# Patient Record
Sex: Female | Born: 1990
Health system: Southern US, Community
[De-identification: ages and names within clinical notes are randomized; demographics above are authoritative.]

## PROBLEM LIST (undated history)

## (undated) DIAGNOSIS — M542 Cervicalgia: Secondary | ICD-10-CM

## (undated) DIAGNOSIS — F329 Major depressive disorder, single episode, unspecified: Secondary | ICD-10-CM

## (undated) DIAGNOSIS — E669 Obesity, unspecified: Secondary | ICD-10-CM

## (undated) DIAGNOSIS — H8110 Benign paroxysmal vertigo, unspecified ear: Secondary | ICD-10-CM

## (undated) DIAGNOSIS — R41840 Attention and concentration deficit: Secondary | ICD-10-CM

## (undated) DIAGNOSIS — M21619 Bunion of unspecified foot: Secondary | ICD-10-CM

## (undated) DIAGNOSIS — D690 Allergic purpura: Secondary | ICD-10-CM

## (undated) DIAGNOSIS — J309 Allergic rhinitis, unspecified: Secondary | ICD-10-CM

## (undated) DIAGNOSIS — L989 Disorder of the skin and subcutaneous tissue, unspecified: Secondary | ICD-10-CM

## (undated) DIAGNOSIS — N83201 Unspecified ovarian cyst, right side: Secondary | ICD-10-CM

## (undated) DIAGNOSIS — F32A Depression, unspecified: Secondary | ICD-10-CM

## (undated) DIAGNOSIS — A6000 Herpesviral infection of urogenital system, unspecified: Secondary | ICD-10-CM

## (undated) DIAGNOSIS — Z113 Encounter for screening for infections with a predominantly sexual mode of transmission: Secondary | ICD-10-CM

## (undated) DIAGNOSIS — Z309 Encounter for contraceptive management, unspecified: Secondary | ICD-10-CM

## (undated) DIAGNOSIS — R591 Generalized enlarged lymph nodes: Secondary | ICD-10-CM

## (undated) DIAGNOSIS — F3289 Other specified depressive episodes: Secondary | ICD-10-CM

## (undated) DIAGNOSIS — J45909 Unspecified asthma, uncomplicated: Secondary | ICD-10-CM

## (undated) DIAGNOSIS — F3281 Premenstrual dysphoric disorder: Secondary | ICD-10-CM

## (undated) HISTORY — DX: Herpesviral infection of urogenital system, unspecified: A60.00

## (undated) HISTORY — DX: Major depressive disorder, single episode, unspecified: F32.9

## (undated) HISTORY — DX: Cervicalgia: M54.2

## (undated) HISTORY — DX: Depression, unspecified: F32.A

## (undated) HISTORY — DX: Disorder of the skin and subcutaneous tissue, unspecified: L98.9

## (undated) HISTORY — DX: Other specified depressive episodes: F32.89

## (undated) HISTORY — DX: Benign paroxysmal vertigo, unspecified ear: H81.10

## (undated) HISTORY — DX: Attention and concentration deficit: R41.840

## (undated) HISTORY — DX: Bunion of unspecified foot: M21.619

## (undated) HISTORY — DX: Allergic purpura: D69.0

## (undated) HISTORY — PX: BUNIONECTOMY: SHX129

## (undated) HISTORY — DX: Encounter for screening for infections with a predominantly sexual mode of transmission: Z11.3

## (undated) HISTORY — DX: Generalized enlarged lymph nodes: R59.1

## (undated) HISTORY — DX: Premenstrual dysphoric disorder: F32.81

## (undated) HISTORY — DX: Unspecified ovarian cyst, right side: N83.201

## (undated) HISTORY — DX: Encounter for contraceptive management, unspecified: Z30.9

## (undated) HISTORY — DX: Obesity, unspecified: E66.9

## (undated) HISTORY — DX: Allergic rhinitis, unspecified: J30.9

---
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
## 2012-01-08 HISTORY — PX: WISDOM TOOTH EXTRACTION: SHX21

## 2012-08-07 HISTORY — PX: OVARIAN CYST REMOVAL: SHX89

## 2012-08-29 ENCOUNTER — Emergency Department: Payer: Self-pay | Admitting: Emergency Medicine

## 2012-08-29 LAB — COMPREHENSIVE METABOLIC PANEL
Anion Gap: 3 — ABNORMAL LOW (ref 7–16)
BUN: 9 mg/dL (ref 7–18)
Bilirubin,Total: 1.1 mg/dL — ABNORMAL HIGH (ref 0.2–1.0)
Calcium, Total: 8.9 mg/dL (ref 8.5–10.1)
Chloride: 106 mmol/L (ref 98–107)
Co2: 28 mmol/L (ref 21–32)
EGFR (Non-African Amer.): >60
Glucose: 84 mg/dL (ref 65–99)
SGOT(AST): 21 U/L (ref 15–37)
SGPT (ALT): 24 U/L (ref 12–78)
Sodium: 137 mmol/L (ref 136–145)

## 2012-08-29 LAB — URINALYSIS, COMPLETE
Bilirubin,UR: NEGATIVE
Blood: NEGATIVE
Glucose,UR: NEGATIVE mg/dL (ref 0–75)
Ketone: NEGATIVE
Nitrite: NEGATIVE
Ph: 8 (ref 4.5–8.0)
Protein: NEGATIVE
RBC,UR: 7 /HPF (ref 0–5)
Specific Gravity: 1.01 (ref 1.003–1.030)
WBC UR: NONE SEEN /HPF (ref 0–5)

## 2012-08-29 LAB — CBC
MCH: 28.8 pg (ref 26.0–34.0)
MCHC: 34.4 g/dL (ref 32.0–36.0)
WBC: 7.9 10*3/uL (ref 3.6–11.0)

## 2012-08-29 LAB — WET PREP, GENITAL

## 2012-08-29 LAB — LIPASE, BLOOD: Lipase: 80 U/L (ref 73–393)

## 2012-09-05 ENCOUNTER — Emergency Department: Payer: Self-pay | Admitting: Emergency Medicine

## 2012-09-05 LAB — CBC WITH DIFFERENTIAL/PLATELET
Basophil #: 0 10*3/uL (ref 0.0–0.1)
Basophil %: 0.6 %
Eosinophil #: 0.2 10*3/uL (ref 0.0–0.7)
Eosinophil %: 3 %
HCT: 40.2 % (ref 35.0–47.0)
Lymphocyte #: 2.5 10*3/uL (ref 1.0–3.6)
MCHC: 34.5 g/dL (ref 32.0–36.0)
MCV: 84 fL (ref 80–100)
Neutrophil %: 50.4 %
Platelet: 214 10*3/uL (ref 150–440)
RBC: 4.79 10*6/uL (ref 3.80–5.20)
RDW: 14 % (ref 11.5–14.5)

## 2012-09-05 LAB — URINALYSIS, COMPLETE
Bacteria: NONE SEEN
Bilirubin,UR: NEGATIVE
Blood: NEGATIVE
Ketone: NEGATIVE
Leukocyte Esterase: NEGATIVE
Nitrite: NEGATIVE
Ph: 6 (ref 4.5–8.0)
Protein: NEGATIVE
Specific Gravity: 1.023 (ref 1.003–1.030)
Squamous Epithelial: 1

## 2012-09-05 LAB — BASIC METABOLIC PANEL
Calcium, Total: 8.7 mg/dL (ref 8.5–10.1)
Chloride: 105 mmol/L (ref 98–107)
Co2: 26 mmol/L (ref 21–32)
Glucose: 87 mg/dL (ref 65–99)
Sodium: 137 mmol/L (ref 136–145)

## 2012-09-09 ENCOUNTER — Ambulatory Visit: Payer: Self-pay | Admitting: Obstetrics & Gynecology

## 2012-09-09 LAB — CBC
HCT: 41.2 % (ref 35.0–47.0)
HGB: 14.1 g/dL (ref 12.0–16.0)
MCH: 28.6 pg (ref 26.0–34.0)
MCV: 84 fL (ref 80–100)
Platelet: 247 10*3/uL (ref 150–440)
RBC: 4.94 10*6/uL (ref 3.80–5.20)
RDW: 13.5 % (ref 11.5–14.5)
WBC: 7.1 10*3/uL (ref 3.6–11.0)

## 2012-09-09 LAB — PREGNANCY, URINE: Pregnancy Test, Urine: NEGATIVE m[IU]/mL

## 2012-09-10 ENCOUNTER — Ambulatory Visit: Payer: Self-pay | Admitting: Obstetrics & Gynecology

## 2012-11-02 ENCOUNTER — Emergency Department: Payer: Self-pay | Admitting: Emergency Medicine

## 2013-08-28 ENCOUNTER — Observation Stay: Payer: Self-pay | Admitting: Obstetrics & Gynecology

## 2013-10-04 ENCOUNTER — Observation Stay: Payer: Self-pay | Admitting: Obstetrics and Gynecology

## 2013-10-04 LAB — PIH PROFILE
ANION GAP: 12 (ref 7–16)
AST: 12 U/L — AB (ref 15–37)
BUN: 3 mg/dL — ABNORMAL LOW (ref 7–18)
CO2: 22 mmol/L (ref 21–32)
Calcium, Total: 8.6 mg/dL (ref 8.5–10.1)
Chloride: 106 mmol/L (ref 98–107)
Creatinine: 0.51 mg/dL — ABNORMAL LOW (ref 0.60–1.30)
EGFR (Non-African Amer.): >60
GLUCOSE: 73 mg/dL (ref 65–99)
HCT: 38.1 % (ref 35.0–47.0)
HGB: 12.3 g/dL (ref 12.0–16.0)
MCH: 27.9 pg (ref 26.0–34.0)
MCHC: 32.4 g/dL (ref 32.0–36.0)
MCV: 86 fL (ref 80–100)
Osmolality: 275 (ref 275–301)
Platelet: 225 10*3/uL (ref 150–440)
Potassium: 3.5 mmol/L (ref 3.5–5.1)
RBC: 4.43 10*6/uL (ref 3.80–5.20)
RDW: 13.4 % (ref 11.5–14.5)
SODIUM: 140 mmol/L (ref 136–145)
Uric Acid: 3.6 mg/dL (ref 2.6–6.0)
WBC: 11.7 10*3/uL — AB (ref 3.6–11.0)

## 2013-10-04 LAB — PROTEIN / CREATININE RATIO, URINE
CREATININE, URINE: 32.6 mg/dL (ref 30.0–125.0)
PROTEIN, RANDOM URINE: 10 mg/dL (ref 0–12)
PROTEIN/CREAT. RATIO: 307 mg/g{creat} — AB (ref 0–200)

## 2013-10-05 ENCOUNTER — Inpatient Hospital Stay: Payer: Self-pay | Admitting: Obstetrics and Gynecology

## 2013-10-05 DIAGNOSIS — O139 Gestational [pregnancy-induced] hypertension without significant proteinuria, unspecified trimester: Secondary | ICD-10-CM

## 2013-10-05 DIAGNOSIS — O322XX0 Maternal care for transverse and oblique lie, not applicable or unspecified: Secondary | ICD-10-CM

## 2013-10-05 LAB — CBC WITH DIFFERENTIAL/PLATELET
BASOS ABS: 0.1 10*3/uL (ref 0.0–0.1)
Basophil %: 0.7 %
Eosinophil #: 0.1 10*3/uL (ref 0.0–0.7)
Eosinophil %: 1.2 %
HCT: 37.5 % (ref 35.0–47.0)
HGB: 12.3 g/dL (ref 12.0–16.0)
Lymphocyte #: 2.5 10*3/uL (ref 1.0–3.6)
Lymphocyte %: 21.2 %
MCH: 28 pg (ref 26.0–34.0)
MCHC: 32.8 g/dL (ref 32.0–36.0)
MCV: 86 fL (ref 80–100)
MONOS PCT: 8.3 %
Monocyte #: 1 x10 3/mm — ABNORMAL HIGH (ref 0.2–0.9)
NEUTROS ABS: 8.2 10*3/uL — AB (ref 1.4–6.5)
Neutrophil %: 68.6 %
PLATELETS: 232 10*3/uL (ref 150–440)
RBC: 4.38 10*6/uL (ref 3.80–5.20)
RDW: 13.6 % (ref 11.5–14.5)
WBC: 12 10*3/uL — AB (ref 3.6–11.0)

## 2013-10-05 LAB — PROTEIN, URINE, 24 HOUR
Collection Hours: 24 hours
PROTEIN, 24 HOUR URINE: 273 mg/(24.h) — AB (ref 30–149)
Protein, Urine: 13 mg/dL (ref 0–12)
Total Volume: 2100 mL

## 2013-10-05 LAB — GC/CHLAMYDIA PROBE AMP

## 2013-10-07 LAB — HEMATOCRIT: HCT: 32 % — ABNORMAL LOW (ref 35.0–47.0)

## 2013-10-24 ENCOUNTER — Emergency Department: Payer: Self-pay | Admitting: Emergency Medicine

## 2013-10-24 LAB — URINALYSIS, COMPLETE
Bilirubin,UR: NEGATIVE
GLUCOSE, UR: NEGATIVE mg/dL (ref 0–75)
KETONE: NEGATIVE
Nitrite: NEGATIVE
Ph: 6 (ref 4.5–8.0)
RBC,UR: 7 /HPF (ref 0–5)
Specific Gravity: 1.012 (ref 1.003–1.030)

## 2013-10-24 LAB — COMPREHENSIVE METABOLIC PANEL
ALT: 18 U/L
ANION GAP: 9 (ref 7–16)
AST: 23 U/L (ref 15–37)
Albumin: 3 g/dL — ABNORMAL LOW (ref 3.4–5.0)
Alkaline Phosphatase: 111 U/L
BUN: 9 mg/dL (ref 7–18)
Bilirubin,Total: 1.8 mg/dL — ABNORMAL HIGH (ref 0.2–1.0)
Calcium, Total: 8.4 mg/dL — ABNORMAL LOW (ref 8.5–10.1)
Chloride: 107 mmol/L (ref 98–107)
Co2: 23 mmol/L (ref 21–32)
Creatinine: 0.86 mg/dL (ref 0.60–1.30)
EGFR (Non-African Amer.): >60
Glucose: 99 mg/dL (ref 65–99)
OSMOLALITY: 276 (ref 275–301)
POTASSIUM: 4 mmol/L (ref 3.5–5.1)
SODIUM: 139 mmol/L (ref 136–145)
Total Protein: 7.8 g/dL (ref 6.4–8.2)

## 2013-10-24 LAB — CBC
HCT: 38.6 % (ref 35.0–47.0)
HGB: 12.4 g/dL (ref 12.0–16.0)
MCH: 27.5 pg (ref 26.0–34.0)
MCHC: 32.1 g/dL (ref 32.0–36.0)
MCV: 86 fL (ref 80–100)
Platelet: 326 10*3/uL (ref 150–440)
RBC: 4.5 10*6/uL (ref 3.80–5.20)
RDW: 13.5 % (ref 11.5–14.5)
WBC: 17.5 10*3/uL — AB (ref 3.6–11.0)

## 2013-10-26 LAB — URINE CULTURE

## 2013-11-17 LAB — HM PAP SMEAR

## 2014-01-07 HISTORY — PX: OTHER SURGICAL HISTORY: SHX169

## 2014-04-29 NOTE — Op Note (Signed)
PATIENT NAME:  Breanna Lamb, FARON MR#:  [id number] DATE OF BIRTH:  [birth year]  DATE OF PROCEDURE:  09/10/2012  PREOPERATIVE DIAGNOSES: 1.  Pelvic pain. 2.  Right lower quadrant pain. 3.  Right ovarian cyst.   POSTOPERATIVE DIAGNOSES: 1.  Pelvic pain. 2.  Right lower quadrant pain. 3.  Right ovarian cyst.   PROCEDURE: Operative laparoscopy with right ovarian cystectomy.   SURGEON:  Barnett Applebaum, MD.   ANESTHESIA: General.   ESTIMATED BLOOD LOSS: Minimal.   COMPLICATIONS: None.   FINDINGS: Right ovarian cyst, left tubal cyst also removed. Normal uterus, fallopian tubes appendix, liver and bowel.   DISPOSITION: To the recovery room in stable condition.   TECHNIQUE: The patient is prepped and draped in the usual sterile fashion after adequate anesthesia is obtained in the dorsal lithotomy position. A Foley is placed and a tenaculum is placed in the cervix for manipulation purposes. Attention is then turned to the abdomen where a Veress needle was inserted through a 5 mm infraumbilical incision after Marcaine is used to anesthetize the skin. Veress needle placement is confirmed using the hanging-drop technique and the abdomen is then insufflated with CO2 gas. A 5 mm trocar is then inserted under direct visualization with the laparoscope with no injuries or bleeding noted. The patient is placed in the Trendelenburg position and the above-mentioned findings are visualized.   A 5 mm trocar is placed in the left lower quadrant and an 11 mm trocar is placed in the suprapubic region with no injuries to vascular structures or bowel structures upon entry. The left tubal cyst is grasped and easily excised using the Harmonic scalpel. It is removed. The right ovarian cyst is then grasped and carefully dissected from the right ovary. There is an easy plane to visualize between the cyst and the ovary. Once the cyst is removed, the base of the cyst that was attached to the ovary is completely hemostatic  and intact. The cyst that was removed is sent to pathology for further review. The pelvic cavity is irrigated with aspiration of all fluid. Excellent hemostasis is noted. No apparent injury to bowel, bladder, ureter or other structures is visualized. The patient is leveled. Gas is expelled. Trocars are removed. The skin is closed with Dermabond. The patient goes to the recovery room in stable condition. Foley catheter and speculum were removed prior to leaving the operating suite.    ____________________________ R. Barnett Applebaum, MD rph:ce D: 09/10/2012 14:00:33 ET T: 09/10/2012 14:25:32 ET JOB#: [id number]  cc: Glean Salen, MD, <Dictator> Gae Dry MD ELECTRONICALLY SIGNED 09/10/2012 18:15

## 2014-04-30 NOTE — Op Note (Signed)
PATIENT NAME:  Breanna Lamb, Breanna Lamb MR#:  [id number] DATE OF BIRTH:  1990/04/17  DATE OF PROCEDURE:  10/05/2013  PREOPERATIVE DIAGNOSES:  52.  A 24 year old gravida 2, para 1, 0-0-1 at 37 weeks 4 days gestation.  2.  Gestational hypertension.  3.  Transverse lie, back up, with failed version.   POSTOPERATIVE DIAGNOSES: 17.  A 24 year old gravida 2, para 1, 0-0-1 at 37 weeks 4 days gestation.  2.  Gestational hypertension.  3.  Transverse lie, back up, with failed version.   OPERATION PERFORMED:  Low transverse C-section via Pfannenstiel skin incision.   ANESTHESIA USED:  Spinal.   PRIMARY SURGEON:  Stoney Bang. Georgianne Fick, MD.   PREOPERATIVE ANTIBIOTICS:  2 grams of Ancef.   ESTIMATED BLOOD LOSS:  800 mL.   OPERATIVE FLUIDS:  800 mL.   URINE OUTPUT:  50 mL.   DRAINS OR TUBES:  Foley to gravity drainage, On-Q catheter system.   IMPLANTS:  None.   COMPLICATIONS:  None.   SPECIMENS REMOVED:  None.   FINDINGS:  Normal tubes, ovaries, and uterus. The fetus was noted to be in the transverse, back-up position with the fetal vertex in the right lower quadrant. Delivery was accomplished via breech extraction and resulted in the birth of a liveborn female infant weighing 3130 grams, 6 pounds 14 ounces, with Apgars 8 and 9.   PATIENT CONDITION FOLLOWING PROCEDURE: Stable.   PROCEDURE IN DETAIL:  Risks, benefits, and alternatives of the procedure were discussed with the patient prior to proceeding to the operating room. The patient did have an attempt at external cephalic version, noting adequate fluid on yesterday's ultrasound of 13 cm with the fetus in the transverse back-up position with the vertex in the right lower quadrant. The patient had several attempts made to move the fetal vertex into the pelvis; however, these were unsuccessful and we had the discussion of expectant management, versus guideline recommendations for hypertensive disorders in pregnancy past 37 weeks.  The patient  elected to proceed with a cesarean delivery after our discussion. She was taken to the operating room where she was administered spinal anesthesia. She was positioned in the supine position, prepped and draped in the usual sterile fashion. A timeout was performed. The level of anesthetic was checked and noted to be adequate prior to proceeding with the case.   A Pfannenstiel skin incision was made 2 cm above the pubic symphysis, carried down sharply to the level of the rectus fascia. The fascia was incised in the midline using the scalpel. The fascial incision was then extended using Mayo scissors. The superior border of the rectus fascia was grasped with 2 Kocher clamps. The underlying rectus muscles were dissected off the fascia using blunt dissection. The inferior border of the rectus fascia was dissected off the rectus muscle in a similar fashion. The midline was identified. The peritoneum was entered bluntly. The peritoneal incision was extended using manual traction. An Alexis retractor was then placed to allow visualization of the lower uterine segment. A low transverse incision was scored on the lower uterine segment. The uterus was then entered bluntly using the operator's finger and extended using manual traction. Placing the operator's hand into the uterus the infant was noted to be in the transverse position as previously noted on ultrasound. The legs were grasped and the fetus was delivered via breech extraction to the level of the scapulae. The left arm was splinted across the fetal chest, delivered atraumatically. The fetus was then rotated 180 degrees, the  right arm was splinted and delivered in a similar fashion. The fetal head was flexed and delivered atraumatically using fundal pressure.   The infant was suctioned, the cord was clamped and cut and the infant was passed to the awaiting pediatricians. The placenta was delivered using manual extraction. The uterus was wiped clean of clots and  debris and repaired using a 2-layer closure of 0-Vicryl with the 1st being a running locked, the 2nd a vertical imbricating. The hysterotomy incision was inspected and noted to be hemostatic. The Alexis retractor was removed. The peritoneum was closed using 2-0 Vicryl in a running fashion. The On-Q catheters were then placed subfascially per the usual protocol. After placement of the On-Q catheters, the fascia was closed using a looped #1 PDS.   The subcutaneous tissue was irrigated and hemostasis achieved using the Bovie. A 0-chromic suture on a 53 T-needle was used to close the subcutaneous dead space. The skin was closed using staples. The On-Q catheters were bolused with 5 mL of 0.5% bupivacaine each. Sponge, needle, and instrument counts were correct x 2. The patient tolerated the procedure well and was taken to the recovery room in stable condition.    ____________________________ Stoney Bang. Georgianne Fick, MD ams:lt D: 10/06/2013 07:42:21 ET T: 10/06/2013 08:14:38 ET JOB#: [id number]  cc: Stoney Bang. Georgianne Fick, MD, <Dictator> Conan Bowens Madelon Lips MD ELECTRONICALLY SIGNED 11/04/2013 13:31

## 2014-05-17 NOTE — H&P (Signed)
L&D Evaluation:  History:  HPI CC: preterm labor evaluation  HPI: 24 y/o G2P1001 @ 32/1 (6wk u/s) with above CC. Preg c/b h/o depression (no meds), BMI 43.   Having UCs since 1900 yesterday that were about q49m last night but have spaced to irregular and occasional UCs. no VB, LOF or decreased FM   Medications Pre Natal Vitamins   ROS:  ROS as per HPI   Exam:  Vital Signs AF normal and stable   General no apparent distress   Chest CTAB   Heart RRR, no MRGs   Abdomen gravid, non-tender   Fetal Position cephalic   Back no CVAT   Pelvic EGBUS normal, SSE with normal minimal white watery d/c in vault, cervisu visually closed. SVE cl/25/high/posterior/intermediate   Mebranes Intact   FHT 125 baseline, +accels, no decels, mod var   Ucx absent   Impression:  Impression negative PTL evalution   Plan:  Comments *IUP: rNST and negative toco *PTL: precautions given. encouraged PO hydration *Elevated BMI: encouraged her to start asa 81mg  po qday *Dispo: d/c to home  RTC 8/24 for u/s and HROB MD visit   Follow Up Appointment already scheduled   Electronic Signatures: Aletha Halim (MD)  (Signed 22-Aug-15 13:15)  Authored: L&D Evaluation   Last Updated: 22-Aug-15 13:15 by Aletha Halim (MD)

## 2014-05-17 NOTE — H&P (Signed)
L&D Evaluation:  History:  HPI 24 y/o G2P1001 with EDC=10/22/2013 @ 37/4 (6wk u/s) presented to the office yesterday and noted to have new onset HTN without proteinuria and reported not feeling well.   Her BP in the office was 140/100 and 139/90.  She was sent to L&D and noted to be normotensive to mild range with urine P/C ratio of 307.  She has had occasional headches nothing sustained, no vision changes, no RUQ or epigastric pain, no increased edema  +FM, no LOF, no VB, irregular contractions   Presents with elevated BP and headache for PIH evaluation   Patient's Medical History Obesity. Depression   Medications Pre Natal Vitamins   Allergies NKDA   Social History none   Family History Non-Contributory   ROS:  ROS All systems were reviewed.  HEENT, CNS, GI, GU, Respiratory, CV, Renal and Musculoskeletal systems were found to be normal.   Exam:  Vital Signs 144/70, 109/39. 119/58, 121/51, 145/47. 145/63, 141/68, 138/67, 147/55, 130/57   Urine Protein 24-hr urine 273mg    General no apparent distress   Mental Status clear   Chest clear   Heart normal sinus rhythm, no murmur/gallop/rubs   Abdomen gravid, non-tender   Fetal Position transverse on ultrasound yesterday   Edema +1 pedal and hand edema   Mebranes Intact   FHT normal rate with no decels, 130 baseline with accels to 160s to 180, Cat 1   Ucx absent   Skin dry   Impression:  Impression IUP at 37 47 by 6 week ultrasound and GHTN   Plan:  Comments 1) GHTN - at [redacted]w[redacted]d discussed recommendation laid out in ACOG taskforce on hypertension in pregnancy bulletin recommending delivery at >37 weeks.  We discussed pros and cons of delivery in the early term period.  Delivery helps assure that patient does not develop lab abnormalities or worsening blood pressures which may also affect the fetus.  We discussed that although preeclampsia and GHTN are distinguished by the absence or presence of proteinuria they are  likely similar or at least related entities on a spectrum, 10% of eclamptics do not display presence of proteinuria.  The down side to IOL is potential length before achieving labor, and increased risk of C-section.  In addition fetus is transverse position fetal spine up, head maternal right. - Patient opts to proceed with moving towards delivery  2) Transverse lie - will attempt external cephalic version, if unsucessfull proceed with primary LTCS for delivery.  Last ate around 13:00 today - AFI 13cm on 10/04/2013 - Growth scan 09/27/2013 - [redacted]w[redacted]d 2980g, 6lbs 9oz c/w 53.4%ile - 23 lbs weight gain this pregnancy - Early 1-hr 128, and 28 weeks - Pelvis tested to 7lbs   3) A pos / ABSC neg / RI / VZI / HBsAg neg / HIV neg / RPR NR / 1st Trimester screen negative / MSAFP negative / 128 OGTT / GBS positive 09/28/2013  4) TDAP 08/16/2013  5) Breast / Mirena  6) Fetus - category I tracing  7) Disposition - pending delivery   Electronic Signatures: Dorthula Nettles (MD)  (Signed 29-Sep-15 18:48)  Authored: L&D Evaluation   Last Updated: 29-Sep-15 18:48 by Dorthula Nettles (MD)

## 2014-05-17 NOTE — H&P (Signed)
L&D Evaluation:  History:  HPI CC: Presents from office for a PIH evaluation. c/o frontal headache currently  HPI: 24 y/o G2P1001 with EDC=10/22/2013 @ 37/3 (6wk u/s) presented to office for [employer] with a BP of 140/90 and negative protein dipstick.  Preg c/b h/o depression (no meds), BMI 43.  Baby is transverse presentation-declines ECV Having UCs since 1900 yesterday that were about q10m last night but have spaced to irregular and occasional UCs. no VB, LOF or decreased FM   Presents with elevated BP and headache for PIH evaluation   Patient's Medical History Obesity. Depression   Medications Pre Natal Vitamins   Allergies NKDA   Social History none   Family History Non-Contributory   ROS:  ROS Positive for frontal headache, dizziness, nasal congestion, rhinorrhea,.   Exam:  Vital Signs 141/65, 131/65, 124/59, 118/59, 113/54, 112/56, 119/58, 142/62, 121/55   Urine Protein negative dipstick, PC ratio=307 mgm   General no apparent distress   Mental Status clear   Chest clear   Heart normal sinus rhythm, no murmur/gallop/rubs   Abdomen gravid, non-tender   Fetal Position transverse on today's [place]   Edema +1 pedal and hand edema   Reflexes 1+   Mebranes Intact   FHT 130 baseline with accels to 160s to 180, Cat 1   Ucx absent   Skin dry   Other Rt TM WNL. Lt TM dull, not inflammed. Nose: nasal mucosa inflammed LT>RT with drainage present. OP: no inflammation, tonsils present without exudate H&H=12.3gm/dl&38.1%, plt=225K, BUN=3/cr=0.51, Uric acid=3.6.   Impression:  Impression IUP at 37 3/7 weeks with minimally elevated systolic BPs and borderline elevated P/C ratio. R/O preeclmpsia. Transverse presentation. Reactive NST. URI vs allergies   Plan:  Plan DC home collecting a 24 urine for protein. RTN to L&D with 24 hr urine tomorrow and for BP check..   Comments Can use Afrin spray for congestion and antihistimines for rhinorrhea   Electronic  Signatures: Karene Fry (CNM)  (Signed 28-Sep-15 13:27)  Authored: L&D Evaluation   Last Updated: 28-Sep-15 13:27 by Karene Fry (CNM)

## 2014-06-07 ENCOUNTER — Ambulatory Visit
Admission: RE | Admit: 2014-06-07 | Discharge: 2014-06-07 | Disposition: A | Discharge status: Home / Self Care | Payer: Medicaid Other | Source: Ambulatory Visit | Attending: Physician Assistant | Admitting: Physician Assistant

## 2014-06-07 ENCOUNTER — Other Ambulatory Visit: Payer: Self-pay | Admitting: Physician Assistant

## 2014-06-07 DIAGNOSIS — M549 Dorsalgia, unspecified: Secondary | ICD-10-CM | POA: Insufficient documentation

## 2014-06-07 DIAGNOSIS — M546 Pain in thoracic spine: Secondary | ICD-10-CM

## 2014-06-29 ENCOUNTER — Ambulatory Visit: Payer: Medicaid Other

## 2014-07-06 ENCOUNTER — Ambulatory Visit: Payer: Medicaid Other | Attending: Physician Assistant

## 2014-07-06 DIAGNOSIS — M545 Low back pain, unspecified: Secondary | ICD-10-CM

## 2014-07-06 DIAGNOSIS — M546 Pain in thoracic spine: Secondary | ICD-10-CM | POA: Insufficient documentation

## 2014-07-06 NOTE — Patient Instructions (Signed)
Reviewed exercises performed and given as part of her home exercise program (please see treatment flow sheet). Patient demonstrated and verbalized understanding.   Improved exercise technique, movement at target joints, use of target muscles after mod verbal, visual, tactile cues.

## 2014-07-06 NOTE — Therapy (Signed)
[facility] [address] [place], [address] Phone: [phone]   Fax:  [phone]  Physical Therapy Evaluation  Patient Details  Name: Breanna Lamb MRN: [id number] Date of Birth: 1990-05-08 Referring Provider:  Nat Christen, PA-C  Encounter Date: 07/06/2014      PT End of Session - 07/06/14 0848    Visit Number 1   Number of Visits 9   Date for PT Re-Evaluation 08/04/14   PT Start Time 0848   PT Stop Time 0957   PT Time Calculation (min) 69 min   Activity Tolerance Patient tolerated treatment well;No increased pain   Behavior During Therapy [facility] for tasks assessed/performed      Past Medical History  Diagnosis Date  . Skin lesion   . Lymphadenopathy   . Poor concentration   . Depression   . Neck pain   . Screening for venereal disease   . Benign positional vertigo   . Contraceptive management   . Allergic rhinitis   . Genital herpes   . Bunion   . Premenstrual dysphoric syndrome   . Premenstrual dysphoric syndrome   . Ovarian cyst, right   . Henoch-Schonlein purpura     Past Surgical History  Procedure Laterality Date  . Ovarian cyst removal  August 2014  . Bunionectomy  2010 and 2011  . Mirena iud  01/2014    There were no vitals filed for this visit.  Visit Diagnosis:  Thoracic back pain, unspecified back pain laterality - Plan: PT plan of care cert/re-cert  Bilateral low back pain without sciatica - Plan: PT plan of care cert/re-cert      Subjective Assessment - 07/06/14 0855    Subjective Back pain current: 3/10 (pt sitting on chair); best 2/10 (when pt is laying on her stomach to go to bed);   worst: 8/10    Pertinent History Pt states having thoracic and low back pain. Upper back pain began about 5 years. Pt lost 56 lbs since January 2016 (on a diet). Back pain began gradually. Pt states sitting slouched, standing up for a long period of time, bending over to give her  2 daughters (2 years old and 41 month old a bath), bother her back. Low back pain began a few days ago when pt was laying on her R side on the couch for 30 minutes. Low back pain is aggravated when she gives her daughters a bath.  Able to tolerate standing for 45 min, able to tolerate sitting for 30 min. Feels tingling and burning upper thoracic spine. Some incontinence with urination but pt states it may be due to giving birth. Denies bowel problems or LE paresthesia.    Patient Stated Goals "Just be able to stand longer, sit longer."   Currently in Pain? Yes   Pain Score 3    Pain Location Back   Multiple Pain Sites No            [facility] PT Assessment - 07/06/14 0907    Assessment   Medical Diagnosis Back pain, thoracic region   Onset Date/Surgical Date 06/08/14   Prior Therapy No known physical therapy for her back   Precautions   Precaution Comments no known precautions   Restrictions   Other Position/Activity Restrictions no known restrictions   Balance Screen   Has the patient fallen in the past 6 months No   Has the patient had a decrease in activity level because of a  fear of falling?  No   Is the patient reluctant to leave their home because of a fear of falling?  No   Prior Function   Vocation Unemployed   Vocation Requirements PLOF: no problems bending over, giving daughters a bath, prolonged standing, prolonged sitting   Observation/Other Assessments   Observations repeated flexion test positive with reproduction of back symtoms. (+) slump test bilaterally with reproduction of symptoms. (+) long sit test suggesting posterior nutation L innominate and anterior nutation R innominate.    Posture/Postural Control   Posture Comments R PSIS lower, slight R lateral shift; bilaterally protracted shoulders, bilaterally pronated feet L > R; supine posture: slight R trunk rotation   AROM   Overall AROM Comments forward flexion WFL (reproducted thoracic pain), back extension WFL but  limited hip extension (reproduced low back pain; side bend WFL bilaterally without symptoms; trunk rotation R full, L slightly limited, no symptoms.    PROM   Overall PROM Comments hip extension R 10 degrees; L 20 degrees   Strength   Overall Strength Comments hip flexion 4/5 bilaterally; knee extension 5/5 bilaterally; knee flexion R 4/5, L 4+/5; ankle DF 4+/5 bilaterally; hip abduction R 4+/5, L 5/5; glute max extension 4/5 bilaterally   Palpation   Palpation comment TTP low back L > R ( sacral ala, L5, L4, L3)           [facility] Adult PT Treatment/Exercise - 07/06/14 0907    Exercises   Other Exercises  Directed patient with supine self muscle energy technique to correct anterior nutation of R innominate 3x5 second holds for 3 sets, supine transversus abdominis 3x5 seconds then with pelvic floor contractions 3x5 seconds; supine position with towel roll behind mid thoracic spine x 5 min.            PT Education - 07/06/14 2106    Education provided Yes   Education Details ther-ex, HEP   Person(s) Educated Patient   Methods Explanation;Demonstration;Tactile cues;Verbal cues;Handout   Comprehension Verbalized understanding;Returned demonstration             PT Long Term Goals - 07/06/14 2113    PT LONG TERM GOAL #1   Title Patient will be independent with her home exercise program to decrease pain and improve ability to take care of her children.    Baseline Patient has started a limited home exercise program.    Time 4   Period Weeks   Status New   PT LONG TERM GOAL #2   Title Patient will improve her modified oswestry low back pain disability questionnaire by at least 6 points as a demonstration of improved function and decreased self perceived disability.    Baseline 36 % (Moderate disability)   Time 4   Period Weeks   Status New   PT LONG TERM GOAL #3   Title Patient will report a decrease in back pain to 4/10 or less at worst to improve function as well as to  improve ability to give her children a bath.    Baseline 8/10 at worst   Time 4   Period Weeks   Status New               Plan - 07/06/14 2107    Clinical Impression Statement Patient is a 24 year old female who came to physical therapy secondary to back pain. She also presents wtih reproduction of symptoms with forward flexion, positive special tests suggesting lumbopelvic involvement and disc involvment; neural tension,  LE weakness, and difficulty performing fucntional tasks such as giving her children a bath, as well as sitting or standing for long periods. Patient will benefit from skilled physical therapy services to address the aforementioned deficits.    Pt will benefit from skilled therapeutic intervention in order to improve on the following deficits Pain;Decreased strength;Improper body mechanics   Rehab Potential Good   PT Frequency 2x / week   PT Duration 4 weeks   PT Treatment/Interventions Therapeutic exercise;Therapeutic activities;Manual techniques;Moist Heat;Electrical Stimulation;Cryotherapy;Patient/family education;Neuromuscular re-education;Traction   PT Next Visit Plan lumbopelvic positioning, lumbopelvic stability, hip strengthening, neural mobility, trunk strengthening   Consulted and Agree with Plan of Care Patient         Problem List There are no active problems to display for this patient.    Joneen Boers PT, DPT  07/06/2014, 9:26 PM  [facility] [address], [place], [address] Phone: [phone]   Fax:  [phone]

## 2014-07-12 ENCOUNTER — Ambulatory Visit: Payer: Medicaid Other

## 2015-06-14 DIAGNOSIS — R102 Pelvic and perineal pain: Secondary | ICD-10-CM | POA: Diagnosis not present

## 2015-06-16 ENCOUNTER — Other Ambulatory Visit: Payer: Self-pay | Admitting: Primary Care

## 2015-06-16 DIAGNOSIS — N83202 Unspecified ovarian cyst, left side: Secondary | ICD-10-CM

## 2015-06-22 ENCOUNTER — Ambulatory Visit
Admission: RE | Admit: 2015-06-22 | Discharge: 2015-06-22 | Disposition: A | Discharge status: Home / Self Care | Payer: 59 | Source: Ambulatory Visit | Attending: Primary Care | Admitting: Primary Care

## 2015-06-22 DIAGNOSIS — Z975 Presence of (intrauterine) contraceptive device: Secondary | ICD-10-CM | POA: Insufficient documentation

## 2015-06-22 DIAGNOSIS — N83202 Unspecified ovarian cyst, left side: Secondary | ICD-10-CM

## 2015-06-22 DIAGNOSIS — N83201 Unspecified ovarian cyst, right side: Secondary | ICD-10-CM | POA: Insufficient documentation

## 2015-08-09 DIAGNOSIS — E669 Obesity, unspecified: Secondary | ICD-10-CM | POA: Diagnosis not present

## 2015-09-13 DIAGNOSIS — Z1389 Encounter for screening for other disorder: Secondary | ICD-10-CM | POA: Diagnosis not present

## 2015-09-13 DIAGNOSIS — E669 Obesity, unspecified: Secondary | ICD-10-CM | POA: Diagnosis not present

## 2015-09-13 DIAGNOSIS — F329 Major depressive disorder, single episode, unspecified: Secondary | ICD-10-CM | POA: Diagnosis not present

## 2015-09-17 ENCOUNTER — Emergency Department
Admission: EM | Admit: 2015-09-17 | Discharge: 2015-09-17 | Disposition: A | Discharge status: Home / Self Care | Payer: 59 | Attending: Emergency Medicine | Admitting: Emergency Medicine

## 2015-09-17 ENCOUNTER — Emergency Department: Payer: 59

## 2015-09-17 ENCOUNTER — Encounter: Payer: Self-pay | Admitting: Emergency Medicine

## 2015-09-17 DIAGNOSIS — Y92009 Unspecified place in unspecified non-institutional (private) residence as the place of occurrence of the external cause: Secondary | ICD-10-CM | POA: Insufficient documentation

## 2015-09-17 DIAGNOSIS — Y999 Unspecified external cause status: Secondary | ICD-10-CM | POA: Insufficient documentation

## 2015-09-17 DIAGNOSIS — S99922A Unspecified injury of left foot, initial encounter: Secondary | ICD-10-CM | POA: Diagnosis not present

## 2015-09-17 DIAGNOSIS — W109XXA Fall (on) (from) unspecified stairs and steps, initial encounter: Secondary | ICD-10-CM | POA: Insufficient documentation

## 2015-09-17 DIAGNOSIS — Y9301 Activity, walking, marching and hiking: Secondary | ICD-10-CM | POA: Diagnosis not present

## 2015-09-17 DIAGNOSIS — S93602A Unspecified sprain of left foot, initial encounter: Secondary | ICD-10-CM | POA: Insufficient documentation

## 2015-09-17 DIAGNOSIS — M79672 Pain in left foot: Secondary | ICD-10-CM | POA: Diagnosis not present

## 2015-09-17 NOTE — ED Provider Notes (Signed)
[facility] Emergency Department Provider Note ____________________________________________  Time seen: 2114  I have reviewed the triage vital signs and the nursing notes.  HISTORY  Chief Complaint  Foot Pain  HPI Breanna A Wayne Both is a 25 y.o. female presents to the ED for evaluation of left foot pain. She describes at home today stumbling as she walks down the steps from her walk and attic. She discussed being barefoot when she missed the last few steps causing her to fall and injure the top of her left foot. She presents now after the onset of some bruising dorsally andtingling to the toes. She denies any other injury at this time. She has been ambulatory since the accident.  Past Medical History:  Diagnosis Date  . Allergic rhinitis   . Benign positional vertigo   . Bunion   . Contraceptive management   . Depression   . Genital herpes   . Henoch-Schonlein purpura ([place])   . Lymphadenopathy   . Neck pain   . Ovarian cyst, right   . Poor concentration   . Premenstrual dysphoric syndrome   . Premenstrual dysphoric syndrome   . Screening for venereal disease   . Skin lesion     There are no active problems to display for this patient.  Past Surgical History:  Procedure Laterality Date  . BUNIONECTOMY  2010 and 2011  . mirena IUD  01/2014  . OVARIAN CYST REMOVAL  August 2014    Prior to Admission medications   Medication Sig Start Date End Date Taking? Authorizing Provider  albuterol (PROVENTIL HFA) 108 (90 BASE) MCG/ACT inhaler Inhale 2 puffs into the lungs every 4 (four) hours as needed for wheezing or shortness of breath.    Historical Provider, MD  FLUoxetine (PROZAC) 20 MG capsule Take 20 mg by mouth daily.    Historical Provider, MD  fluticasone (FLONASE) 50 MCG/ACT nasal spray Place into both nostrils daily.    Historical Provider, MD  valACYclovir (VALTREX) 500 MG tablet Take 500 mg by mouth 2 (two) times daily.    Historical Provider, MD     Allergies Nickel  Family History  Problem Relation Age of Onset  . Hypertension Mother   . Hypertension Father   . Hypertension Brother   . Hypertension Maternal Grandmother   . Diabetes Maternal Grandmother     Social History Social History  Substance Use Topics  . Smoking status: Never Smoker  . Smokeless tobacco: Not on file  . Alcohol use No   Review of Systems  Constitutional: Negative for fever. Musculoskeletal: Negative for back pain. Left foot pain as above Skin: Negative for rash. Neurological: Negative for headaches, focal weakness or numbness. ____________________________________________  PHYSICAL EXAM:  VITAL SIGNS: ED Triage Vitals  Enc Vitals Group     BP 09/17/15 1952 129/75     Pulse Rate 09/17/15 1952 72     Resp 09/17/15 1952 18     Temp 09/17/15 1952 98.4 F (36.9 C)     Temp Source 09/17/15 1952 Oral     SpO2 09/17/15 1952 99 %     Weight 09/17/15 1953 195 lb (88.5 kg)     Height 09/17/15 1953 5\' 1"  (1.549 m)     Head Circumference --      Peak Flow --      Pain Score 09/17/15 1958 7     Pain Loc --      Pain Edu? --      Excl. in [place]? --  Constitutional: Alert and oriented. Well appearing and in no distress. Head: Normocephalic and atraumatic. Cardiovascular: Normal distal pulses and cap refill.  Musculoskeletal: Left foot with subtle bruising noted dorsally. There is also superficial abrasion to the anterior ankle. Patient with normal gross sensation and normal ankle range of motion. Nontender with normal range of motion in all extremities.  Neurologic:  Normal gait without ataxia. Normal speech and language. No gross focal neurologic deficits are appreciated. Skin:  Skin is warm, dry and intact. No rash noted. ____________________________________________   RADIOLOGY  Left Foot IMPRESSION: No acute fracture or subluxation of the left foot.  I, Delynda Sepulveda, Dannielle Karvonen, personally viewed and evaluated these images (plain  radiographs) as part of my medical decision making, as well as reviewing the written report by the radiologist. ____________________________________________  PROCEDURES  Ace wrap ____________________________________________  INITIAL IMPRESSION / ASSESSMENT AND PLAN / ED COURSE  Patient with a left foot sprain without radiologic evidence of fracture dislocation. She is fitted with an Ace wrap with some cast padding for skin production. She is given instruction on [place] management of her ankle and foot sprain. She will follow with primary care provider for ongoing symptom management.  Clinical Course   ____________________________________________  FINAL CLINICAL IMPRESSION(S) / ED DIAGNOSES  Final diagnoses:  Foot sprain, left, initial encounter      Melvenia Needles, PA-C 09/17/15 2124    Schuyler Amor, MD 09/17/15 [phone]

## 2015-09-17 NOTE — ED Triage Notes (Signed)
Patient reports walking and left foot went backward and she heard a pop and her toes are tingly.  Reports happened at approximately 5 pm.

## 2015-10-26 ENCOUNTER — Other Ambulatory Visit: Payer: Self-pay | Admitting: *Deleted

## 2015-10-26 MED ORDER — AZITHROMYCIN 250 MG PO TABS: ORAL_TABLET | ORAL | 0 refills | Status: AC

## 2015-11-09 DIAGNOSIS — G44209 Tension-type headache, unspecified, not intractable: Secondary | ICD-10-CM | POA: Diagnosis not present

## 2015-11-09 DIAGNOSIS — R202 Paresthesia of skin: Secondary | ICD-10-CM | POA: Diagnosis not present

## 2015-11-09 DIAGNOSIS — H5310 Unspecified subjective visual disturbances: Secondary | ICD-10-CM | POA: Diagnosis not present

## 2016-01-31 ENCOUNTER — Ambulatory Visit (INDEPENDENT_AMBULATORY_CARE_PROVIDER_SITE_OTHER): Payer: 59 | Admitting: Family Medicine

## 2016-01-31 ENCOUNTER — Encounter: Payer: Self-pay | Admitting: Family Medicine

## 2016-01-31 VITALS — BP 116/77 | HR 71 | Temp 98.5°F | Ht 61.5 in | Wt 197.0 lb

## 2016-01-31 DIAGNOSIS — Z113 Encounter for screening for infections with a predominantly sexual mode of transmission: Secondary | ICD-10-CM | POA: Diagnosis not present

## 2016-01-31 DIAGNOSIS — M25542 Pain in joints of left hand: Secondary | ICD-10-CM | POA: Diagnosis not present

## 2016-01-31 DIAGNOSIS — G44229 Chronic tension-type headache, not intractable: Secondary | ICD-10-CM | POA: Diagnosis not present

## 2016-01-31 DIAGNOSIS — F3289 Other specified depressive episodes: Secondary | ICD-10-CM

## 2016-01-31 DIAGNOSIS — M25541 Pain in joints of right hand: Secondary | ICD-10-CM

## 2016-01-31 DIAGNOSIS — Z13220 Encounter for screening for lipoid disorders: Secondary | ICD-10-CM

## 2016-01-31 DIAGNOSIS — F3281 Premenstrual dysphoric disorder: Secondary | ICD-10-CM

## 2016-01-31 LAB — UA/M W/RFLX CULTURE, ROUTINE
BILIRUBIN UA: NEGATIVE
Glucose, UA: NEGATIVE
KETONES UA: NEGATIVE
Leukocytes, UA: NEGATIVE
NITRITE UA: NEGATIVE
PH UA: 8.5 — AB (ref 5.0–7.5)
PROTEIN UA: NEGATIVE
RBC UA: NEGATIVE
SPEC GRAV UA: 1.015 (ref 1.005–1.030)
Urobilinogen, Ur: 0.2 mg/dL (ref 0.2–1.0)

## 2016-01-31 MED ORDER — CYCLOBENZAPRINE HCL 10 MG PO TABS: 10.0000 mg | ORAL_TABLET | Freq: Every evening | ORAL | 1 refills | Status: DC | PRN

## 2016-01-31 MED ORDER — SERTRALINE HCL 25 MG PO TABS: 50.0000 mg | ORAL_TABLET | Freq: Every day | ORAL | 1 refills | Status: DC

## 2016-01-31 NOTE — Progress Notes (Signed)
BP 116/77 (BP Location: Left Arm, Patient Position: Sitting, Cuff Size: Large)   Pulse 71   Temp 98.5 F (36.9 C)   Ht 5' 1.5" (1.562 m)   Wt 197 lb (89.4 kg)   SpO2 99%   BMI 36.62 kg/m    Subjective:    Patient ID: Breanna Lamb, female    DOB: [birth year], 26 y.o.   MRN: [id number]  HPI: Breanna Lamb is a 26 y.o. female  Chief Complaint  Patient presents with  . Establish Care  . headaches   HEADACHES- has had a headache daily Duration: 6-8 months Onset: gradual Severity: severe Quality: dull, aching and pressure-like Frequency: intermittent Location: forehead into her temples Headache duration: hours Radiation: into her neck Time of day headache occurs: first thing in the morning or in the PM Alleviating factors: ibuprofen Aggravating factors: lights Headache status at time of visit: current headache Treatments attempted: Treatments attempted: rest, APAP, ibuprofen, aleve", excedrine and amitriptyline   Aura: no Nausea:  yes Vomiting: no Photophobia:  yes Phonophobia:  no Effect on social functioning:  no  Confusion:  yes Gait disturbance/ataxia:  no Behavioral changes:  no Fevers:  no  Has a lot of joint pains in her hands and her feet. Aching and go numb after a lot of work and first thing in the AM.   Active Ambulatory Problems    Diagnosis Date Noted  . Premenstrual dysphoric syndrome   . Chronic tension-type headache, not intractable 01/31/2016   Resolved Ambulatory Problems    Diagnosis Date Noted  . No Resolved Ambulatory Problems   Past Medical History:  Diagnosis Date  . Allergic rhinitis   . Benign positional vertigo   . Bunion   . Contraceptive management   . Depression   . Genital herpes   . Henoch-Schonlein purpura ([place])   . Lymphadenopathy   . Neck pain   . Ovarian cyst, right   . Poor concentration   . Premenstrual dysphoric syndrome   . Premenstrual dysphoric syndrome   . Screening for venereal disease   .  Skin lesion    Past Surgical History:  Procedure Laterality Date  . BUNIONECTOMY  2010 and 2011  . CESAREAN SECTION    . mirena IUD  01/2014  . OVARIAN CYST REMOVAL  August 2014   Outpatient Encounter Prescriptions as of 01/31/2016  Medication Sig  . [DISCONTINUED] nortriptyline (PAMELOR) 10 MG capsule Take 10 mg by mouth at bedtime.  . cyclobenzaprine (FLEXERIL) 10 MG tablet Take 1 tablet (10 mg total) by mouth at bedtime as needed for muscle spasms.  Marland Kitchen sertraline (ZOLOFT) 25 MG tablet 50 mg.   . [DISCONTINUED] valACYclovir (VALTREX) 500 MG tablet Take 500 mg by mouth 2 (two) times daily.   No facility-administered encounter medications on file as of 01/31/2016.    Allergies  Allergen Reactions  . Nickel    Family History  Problem Relation Age of Onset  . Hypertension Mother   . Mental illness Mother     Depression, Anxiety  . Hypertension Father   . Hypertension Brother   . Hypertension Maternal Grandmother   . Diabetes Maternal Grandmother   . Hyperlipidemia Maternal Grandmother   . Heart disease Maternal Grandmother   . COPD Maternal Grandmother   . Kidney disease Maternal Grandmother   . Mental illness Maternal Grandmother   . Hypertension Sister   . Mental illness Sister   . Heart disease Maternal Grandfather   . Heart attack Maternal Grandfather   .  Cancer Paternal Grandmother    Social History   Social History  . Marital status: Single    Spouse name: N/A  . Number of children: N/A  . Years of education: N/A   Social History Main Topics  . Smoking status: Never Smoker  . Smokeless tobacco: Never Used  . Alcohol use No  . Drug use: No  . Sexual activity: Yes    Birth control/ protection: IUD   Other Topics Concern  . None   Social History Narrative  . None   Review of Systems  Constitutional: Positive for unexpected weight change. Negative for activity change, appetite change, chills, diaphoresis, fatigue and fever.  Respiratory: Negative.     Cardiovascular: Negative.   Musculoskeletal: Positive for arthralgias and back pain. Negative for gait problem, joint swelling, myalgias, neck pain and neck stiffness.  Neurological: Positive for numbness and headaches. Negative for dizziness, tremors, seizures, syncope, facial asymmetry, speech difficulty, weakness and light-headedness.  Psychiatric/Behavioral: Negative.     Per HPI unless specifically indicated above     Objective:    BP 116/77 (BP Location: Left Arm, Patient Position: Sitting, Cuff Size: Large)   Pulse 71   Temp 98.5 F (36.9 C)   Ht 5' 1.5" (1.562 m)   Wt 197 lb (89.4 kg)   SpO2 99%   BMI 36.62 kg/m   Wt Readings from Last 3 Encounters:  01/31/16 197 lb (89.4 kg)  09/17/15 195 lb (88.5 kg)    Physical Exam  Constitutional: She is oriented to person, place, and time. She appears well-developed and well-nourished. No distress.  HENT:  Head: Normocephalic and atraumatic.  Right Ear: Hearing normal.  Left Ear: Hearing normal.  Nose: Nose normal.  Eyes: Conjunctivae and lids are normal. Right eye exhibits no discharge. Left eye exhibits no discharge. No scleral icterus.  Cardiovascular: Normal rate, regular rhythm, normal heart sounds and intact distal pulses.  Exam reveals no gallop and no friction rub.   No murmur heard. Pulmonary/Chest: Effort normal and breath sounds normal. No respiratory distress. She has no wheezes. She has no rales. She exhibits no tenderness.  Musculoskeletal: Normal range of motion.  SCM spasm bilaterally that reproduces her pain  Neurological: She is alert and oriented to person, place, and time.  Skin: Skin is warm, dry and intact. No rash noted. She is not diaphoretic. No erythema. No pallor.  Psychiatric: She has a normal mood and affect. Her speech is normal and behavior is normal. Judgment and thought content normal. Cognition and memory are normal.  Nursing note and vitals reviewed.   Results for orders placed or performed  in visit on 12/19/15  HM PAP SMEAR  Result Value Ref Range   HM Pap smear per CE       Assessment & Plan:   Problem List Items Addressed This Visit      Nervous and Auditory   Chronic tension-type headache, not intractable - Primary    No better with nortriptyline. Will stop it. Start stretches. Will start flexeril. Cut down on ibuprofen to avoid rebound headaches. Call with any concerns. Recheck 1 month.       Relevant Medications   sertraline (ZOLOFT) 25 MG tablet   cyclobenzaprine (FLEXERIL) 10 MG tablet   Other Relevant Orders   Comprehensive metabolic panel   TSH     Other   Premenstrual dysphoric syndrome    Stable. Continue current regimen. Continue to monitor. Call with any concerns.       Relevant  Medications   sertraline (ZOLOFT) 25 MG tablet    Other Visit Diagnoses    Screening for STD (sexually transmitted disease)       Labs checked today. Await results.    Relevant Orders   HIV antibody   UA/M w/rflx Culture, Routine   Arthralgia of both hands       Will check labs for rheumatoid issues. Breaks at work. Call with any concerns.    Relevant Orders   CBC with Differential/Platelet   Comprehensive metabolic panel   Rheumatoid Factor   Antinuclear Antib (ANA)   Sed Rate (ESR)   C-reactive protein   Screening for cholesterol level       Labs checked today, await results.    Relevant Orders   Lipid Panel w/o Chol/HDL Ratio       Follow up plan: Return in about 4 weeks (around 02/28/2016) for follow up headaches, Records release Princella Ion Please.

## 2016-01-31 NOTE — Addendum Note (Signed)
Addended by: [name] on: 01/31/2016 11:01 AM   Modules accepted: Orders

## 2016-01-31 NOTE — Assessment & Plan Note (Signed)
Stable. Continue current regimen. Continue to monitor. Call with any concerns.  

## 2016-01-31 NOTE — Assessment & Plan Note (Signed)
No better with nortriptyline. Will stop it. Start stretches. Will start flexeril. Cut down on ibuprofen to avoid rebound headaches. Call with any concerns. Recheck 1 month.

## 2016-01-31 NOTE — Patient Instructions (Addendum)

## 2016-02-01 LAB — COMPREHENSIVE METABOLIC PANEL
A/G RATIO: 1.4 (ref 1.2–2.2)
ALK PHOS: 81 IU/L (ref 39–117)
ALT: 10 IU/L (ref 0–32)
AST: 15 IU/L (ref 0–40)
Albumin: 4.2 g/dL (ref 3.5–5.5)
BILIRUBIN TOTAL: 1.4 mg/dL — AB (ref 0.0–1.2)
BUN/Creatinine Ratio: 12 (ref 9–23)
BUN: 9 mg/dL (ref 6–20)
CHLORIDE: 102 mmol/L (ref 96–106)
CO2: 25 mmol/L (ref 18–29)
Calcium: 9.2 mg/dL (ref 8.7–10.2)
Creatinine, Ser: 0.77 mg/dL (ref 0.57–1.00)
GFR calc non Af Amer: 108 mL/min/{1.73_m2} (ref >59)
GFR, EST AFRICAN AMERICAN: 125 mL/min/{1.73_m2} (ref >59)
Globulin, Total: 2.9 g/dL (ref 1.5–4.5)
Glucose: 86 mg/dL (ref 65–99)
POTASSIUM: 4.6 mmol/L (ref 3.5–5.2)
Sodium: 142 mmol/L (ref 134–144)
Total Protein: 7.1 g/dL (ref 6.0–8.5)

## 2016-02-01 LAB — CBC WITH DIFFERENTIAL/PLATELET
BASOS ABS: 0 10*3/uL (ref 0.0–0.2)
BASOS: 0 %
EOS (ABSOLUTE): 0.2 10*3/uL (ref 0.0–0.4)
Eos: 3 %
Hematocrit: 40.7 % (ref 34.0–46.6)
Hemoglobin: 14.1 g/dL (ref 11.1–15.9)
Immature Grans (Abs): 0 10*3/uL (ref 0.0–0.1)
Immature Granulocytes: 0 %
Lymphocytes Absolute: 2.5 10*3/uL (ref 0.7–3.1)
Lymphs: 47 %
MCH: 29.1 pg (ref 26.6–33.0)
MCHC: 34.6 g/dL (ref 31.5–35.7)
MCV: 84 fL (ref 79–97)
MONOS ABS: 0.5 10*3/uL (ref 0.1–0.9)
Monocytes: 9 %
NEUTROS ABS: 2.2 10*3/uL (ref 1.4–7.0)
NEUTROS PCT: 41 %
PLATELETS: 269 10*3/uL (ref 150–379)
RBC: 4.84 x10E6/uL (ref 3.77–5.28)
RDW: 13 % (ref 12.3–15.4)
WBC: 5.3 10*3/uL (ref 3.4–10.8)

## 2016-02-01 LAB — SEDIMENTATION RATE: Sed Rate: 2 mm/hr (ref 0–32)

## 2016-02-01 LAB — LIPID PANEL W/O CHOL/HDL RATIO
CHOLESTEROL TOTAL: 182 mg/dL (ref 100–199)
HDL: 47 mg/dL (ref >39)
LDL Calculated: 112 mg/dL — ABNORMAL HIGH (ref 0–99)
TRIGLYCERIDES: 114 mg/dL (ref 0–149)
VLDL Cholesterol Cal: 23 mg/dL (ref 5–40)

## 2016-02-01 LAB — TSH: TSH: 1.36 u[IU]/mL (ref 0.450–4.500)

## 2016-02-01 LAB — ANA: Anti Nuclear Antibody(ANA): NEGATIVE

## 2016-02-01 LAB — HIV ANTIBODY (ROUTINE TESTING W REFLEX): HIV SCREEN 4TH GENERATION: NONREACTIVE

## 2016-02-01 LAB — RHEUMATOID FACTOR

## 2016-02-01 LAB — C-REACTIVE PROTEIN: CRP: 1.6 mg/L (ref 0.0–4.9)

## 2016-02-06 ENCOUNTER — Encounter: Payer: Self-pay | Admitting: Family Medicine

## 2016-02-06 MED ORDER — TOPIRAMATE 25 MG PO TABS: ORAL_TABLET | ORAL | 1 refills | Status: DC

## 2016-02-06 NOTE — Telephone Encounter (Signed)
Will start her on topamax.

## 2016-03-08 ENCOUNTER — Encounter: Payer: Self-pay | Admitting: Family Medicine

## 2016-03-08 ENCOUNTER — Ambulatory Visit (INDEPENDENT_AMBULATORY_CARE_PROVIDER_SITE_OTHER): Payer: 59 | Admitting: Family Medicine

## 2016-03-08 VITALS — BP 112/73 | HR 71 | Temp 98.1°F | Resp 17 | Ht 61.5 in | Wt 199.0 lb

## 2016-03-08 DIAGNOSIS — G44229 Chronic tension-type headache, not intractable: Secondary | ICD-10-CM | POA: Diagnosis not present

## 2016-03-08 MED ORDER — TOPIRAMATE 50 MG PO TABS: ORAL_TABLET | ORAL | 1 refills | Status: DC

## 2016-03-08 NOTE — Progress Notes (Signed)
BP 112/73 (BP Location: Left Arm, Patient Position: Sitting, Cuff Size: Large)   Pulse 71   Temp 98.1 F (36.7 C) (Oral)   Resp 17   Ht 5' 1.5" (1.562 m)   Wt 199 lb (90.3 kg)   SpO2 99%   BMI 36.99 kg/m    Subjective:    Patient ID: Breanna Lamb, female    DOB: 1990-10-18, 26 y.o.   MRN: [id number]  HPI: Breanna Lamb is a 26 y.o. female  Chief Complaint  Patient presents with  . Headache   MIGRAINES- feeling much better! She notes that she only has had to use tylenol 2x, not noticing any side effects  Duration: chronic Onset: gradual Severity: severe Quality: dull, aching and pressure-like Frequency: intermittent Location: forehead into her temples Headache duration: hours Radiation: into her neck Time of day headache occurs: first thing in the morning or in the PM Alleviating factors: ibuprofen, topamax Aggravating factors: lights Headache status at time of visit: current headache Treatments attempted: Treatments attempted: rest, APAP, ibuprofen, aleve", excedrine and amitriptyline, topamax   Aura: no Nausea:  yes Vomiting: no Photophobia:  yes Phonophobia:  no Effect on social functioning:  no  Confusion:  yes Gait disturbance/ataxia:  no Behavioral changes:  no Fevers:  no  Relevant past medical, surgical, family and social history reviewed and updated as indicated. Interim medical history since our last visit reviewed. Allergies and medications reviewed and updated.  Review of Systems  Constitutional: Negative.   Respiratory: Negative.   Cardiovascular: Negative.   Neurological: Negative.   Psychiatric/Behavioral: Negative.     Per HPI unless specifically indicated above     Objective:    BP 112/73 (BP Location: Left Arm, Patient Position: Sitting, Cuff Size: Large)   Pulse 71   Temp 98.1 F (36.7 C) (Oral)   Resp 17   Ht 5' 1.5" (1.562 m)   Wt 199 lb (90.3 kg)   SpO2 99%   BMI 36.99 kg/m   Wt Readings from Last 3  Encounters:  03/08/16 199 lb (90.3 kg)  01/31/16 197 lb (89.4 kg)  09/17/15 195 lb (88.5 kg)    Physical Exam  Constitutional: She is oriented to person, place, and time. She appears well-developed and well-nourished. No distress.  HENT:  Head: Normocephalic and atraumatic.  Right Ear: Hearing normal.  Left Ear: Hearing normal.  Nose: Nose normal.  Eyes: Conjunctivae and lids are normal. Right eye exhibits no discharge. Left eye exhibits no discharge. No scleral icterus.  Cardiovascular: Normal rate, regular rhythm, normal heart sounds and intact distal pulses.  Exam reveals no gallop and no friction rub.   No murmur heard. Pulmonary/Chest: Effort normal and breath sounds normal. No respiratory distress. She has no wheezes. She has no rales. She exhibits no tenderness.  Musculoskeletal: Normal range of motion.  Neurological: She is alert and oriented to person, place, and time.  Skin: Skin is warm, dry and intact. No rash noted. She is not diaphoretic. No erythema. No pallor.  Psychiatric: She has a normal mood and affect. Her speech is normal and behavior is normal. Judgment and thought content normal. Cognition and memory are normal.  Nursing note and vitals reviewed.   Results for orders placed or performed in visit on 01/31/16  HIV antibody  Result Value Ref Range   HIV Screen 4th Generation wRfx Non Reactive Non Reactive  CBC with Differential/Platelet  Result Value Ref Range   WBC 5.3 3.4 - 10.8 x10E3/uL  RBC 4.84 3.77 - 5.28 x10E6/uL   Hemoglobin 14.1 11.1 - 15.9 g/dL   Hematocrit 40.7 34.0 - 46.6 %   MCV 84 79 - 97 fL   MCH 29.1 26.6 - 33.0 pg   MCHC 34.6 31.5 - 35.7 g/dL   RDW 13.0 12.3 - 15.4 %   Platelets 269 150 - 379 x10E3/uL   Neutrophils 41 Not Estab. %   Lymphs 47 Not Estab. %   Monocytes 9 Not Estab. %   Eos 3 Not Estab. %   Basos 0 Not Estab. %   Neutrophils Absolute 2.2 1.4 - 7.0 x10E3/uL   Lymphocytes Absolute 2.5 0.7 - 3.1 x10E3/uL   Monocytes  Absolute 0.5 0.1 - 0.9 x10E3/uL   EOS (ABSOLUTE) 0.2 0.0 - 0.4 x10E3/uL   Basophils Absolute 0.0 0.0 - 0.2 x10E3/uL   Immature Granulocytes 0 Not Estab. %   Immature Grans (Abs) 0.0 0.0 - 0.1 x10E3/uL  Comprehensive metabolic panel  Result Value Ref Range   Glucose 86 65 - 99 mg/dL   BUN 9 6 - 20 mg/dL   Creatinine, Ser 0.77 0.57 - 1.00 mg/dL   GFR calc non Af Amer 108 >59 mL/min/1.73   GFR calc Af Amer 125 >59 mL/min/1.73   BUN/Creatinine Ratio 12 9 - 23   Sodium 142 134 - 144 mmol/L   Potassium 4.6 3.5 - 5.2 mmol/L   Chloride 102 96 - 106 mmol/L   CO2 25 18 - 29 mmol/L   Calcium 9.2 8.7 - 10.2 mg/dL   Total Protein 7.1 6.0 - 8.5 g/dL   Albumin 4.2 3.5 - 5.5 g/dL   Globulin, Total 2.9 1.5 - 4.5 g/dL   Albumin/Globulin Ratio 1.4 1.2 - 2.2   Bilirubin Total 1.4 (H) 0.0 - 1.2 mg/dL   Alkaline Phosphatase 81 39 - 117 IU/L   AST 15 0 - 40 IU/L   ALT 10 0 - 32 IU/L  TSH  Result Value Ref Range   TSH 1.360 0.450 - 4.500 uIU/mL  Lipid Panel w/o Chol/HDL Ratio  Result Value Ref Range   Cholesterol, Total 182 100 - 199 mg/dL   Triglycerides 114 0 - 149 mg/dL   HDL 47 >39 mg/dL   VLDL Cholesterol Cal 23 5 - 40 mg/dL   LDL Calculated 112 (H) 0 - 99 mg/dL  UA/M w/rflx Culture, Routine  Result Value Ref Range   Specific Gravity, UA 1.015 1.005 - 1.030   pH, UA 8.5 (H) 5.0 - 7.5   Color, UA Yellow Yellow   Appearance Ur Clear Clear   Leukocytes, UA Negative Negative   Protein, UA Negative Negative/Trace   Glucose, UA Negative Negative   Ketones, UA Negative Negative   RBC, UA Negative Negative   Bilirubin, UA Negative Negative   Urobilinogen, Ur 0.2 0.2 - 1.0 mg/dL   Nitrite, UA Negative Negative  Rheumatoid Factor  Result Value Ref Range   Rhuematoid fact SerPl-aCnc <10.0 0.0 - 13.9 IU/mL  Antinuclear Antib (ANA)  Result Value Ref Range   Anit Nuclear Antibody(ANA) Negative Negative  Sed Rate (ESR)  Result Value Ref Range   Sed Rate 2 0 - 32 mm/hr  C-reactive protein    Result Value Ref Range   CRP 1.6 0.0 - 4.9 mg/L      Assessment & Plan:   Problem List Items Addressed This Visit      Nervous and Auditory   Chronic tension-type headache, not intractable - Primary    Doing much better  on the topamax. Feeling more like herself. Not quite there. Will increase dose to '100mg'$  BID and recheck in 1 month.       Relevant Medications   topiramate (TOPAMAX) 50 MG tablet       Follow up plan: Return in about 4 weeks (around 04/05/2016) for Headaches.

## 2016-03-08 NOTE — Assessment & Plan Note (Signed)
Doing much better on the topamax. Feeling more like herself. Not quite there. Will increase dose to 100mg  BID and recheck in 1 month.

## 2016-04-11 NOTE — Progress Notes (Signed)
BP 117/76 (BP Location: Left Arm, Patient Position: Sitting, Cuff Size: Normal)   Pulse 69   Temp 98.8 F (37.1 C) (Oral)   Resp 17   Ht 5' 1.5" (1.562 m)   Wt 199 lb (90.3 kg)   SpO2 99%   BMI 36.99 kg/m    Subjective:    Patient ID: Breanna Lamb, female    DOB: Nov 09, 1990, 26 y.o.   MRN: [id number]  HPI: Breanna Lamb is a 26 y.o. female  Chief Complaint  Patient presents with  . Headache   HEADACHES Duration: chronic Severity: moderate Frequency: 2x in last month Onset: gradual Quality: dull, aching and pressure-like Location: forehead into her temples Headache duration:hours Radiation: into her neck Time of day headache occurs: first thing in the morning or in the PM Alleviating factors: ibuprofen, topamax Aggravating factors: lights Headache status at time of visit: current headache Treatments attempted:Treatments attempted: rest, APAP, ibuprofen, aleve", excedrine and amitriptyline, topamax Aura: no Nausea: yes Vomiting: no Photophobia: yes Phonophobia: no Effect on social functioning: no Confusion: yes Gait disturbance/ataxia: no Behavioral changes: no Fevers: no  Sore throat on and off for several years. Will change during the day, goes weeks without it hurting, then starts hurting again. Hasn't been associated with anything she eats. No known heartburn. She has had some chest pain, but that she thinks is due to stress. No fevers, no SOB, otherwise feeling well.   Relevant past medical, surgical, family and social history reviewed and updated as indicated. Interim medical history since our last visit reviewed. Allergies and medications reviewed and updated.  Review of Systems  Constitutional: Negative.   HENT: Positive for sore throat. Negative for congestion, dental problem, drooling, ear discharge, ear pain, facial swelling, hearing loss, mouth sores, nosebleeds, postnasal drip, rhinorrhea, sinus pain, sinus pressure,  sneezing, tinnitus, trouble swallowing and voice change.   Respiratory: Negative.   Cardiovascular: Negative.   Neurological: Negative.   Psychiatric/Behavioral: Negative.     Per HPI unless specifically indicated above     Objective:    BP 117/76 (BP Location: Left Arm, Patient Position: Sitting, Cuff Size: Normal)   Pulse 69   Temp 98.8 F (37.1 C) (Oral)   Resp 17   Ht 5' 1.5" (1.562 m)   Wt 199 lb (90.3 kg)   SpO2 99%   BMI 36.99 kg/m   Wt Readings from Last 3 Encounters:  04/12/16 199 lb (90.3 kg)  03/08/16 199 lb (90.3 kg)  01/31/16 197 lb (89.4 kg)    Physical Exam  Constitutional: She is oriented to person, place, and time. She appears well-developed and well-nourished. No distress.  HENT:  Head: Normocephalic and atraumatic.  Right Ear: Hearing and external ear normal.  Left Ear: Hearing and external ear normal.  Nose: Nose normal.  Mouth/Throat: Oropharynx is clear and moist. No oropharyngeal exudate.  Eyes: Conjunctivae, EOM and lids are normal. Pupils are equal, round, and reactive to light. Right eye exhibits no discharge. Left eye exhibits no discharge. No scleral icterus.  Neck: Normal range of motion. Neck supple. No JVD present. No tracheal deviation present. No thyromegaly present.  Cardiovascular: Normal rate, regular rhythm, normal heart sounds and intact distal pulses.  Exam reveals no gallop and no friction rub.   No murmur heard. Pulmonary/Chest: Effort normal and breath sounds normal. No stridor. No respiratory distress. She has no wheezes. She has no rales. She exhibits no tenderness.  Musculoskeletal: Normal range of motion.  Lymphadenopathy:    She  has no cervical adenopathy.  Neurological: She is alert and oriented to person, place, and time.  Skin: Skin is warm, dry and intact. No rash noted. She is not diaphoretic. No erythema. No pallor.  Psychiatric: She has a normal mood and affect. Her speech is normal and behavior is normal. Judgment and  thought content normal. Cognition and memory are normal.  Nursing note and vitals reviewed.   Results for orders placed or performed in visit on 01/31/16  HIV antibody  Result Value Ref Range   HIV Screen 4th Generation wRfx Non Reactive Non Reactive  CBC with Differential/Platelet  Result Value Ref Range   WBC 5.3 3.4 - 10.8 x10E3/uL   RBC 4.84 3.77 - 5.28 x10E6/uL   Hemoglobin 14.1 11.1 - 15.9 g/dL   Hematocrit 40.7 34.0 - 46.6 %   MCV 84 79 - 97 fL   MCH 29.1 26.6 - 33.0 pg   MCHC 34.6 31.5 - 35.7 g/dL   RDW 13.0 12.3 - 15.4 %   Platelets 269 150 - 379 x10E3/uL   Neutrophils 41 Not Estab. %   Lymphs 47 Not Estab. %   Monocytes 9 Not Estab. %   Eos 3 Not Estab. %   Basos 0 Not Estab. %   Neutrophils Absolute 2.2 1.4 - 7.0 x10E3/uL   Lymphocytes Absolute 2.5 0.7 - 3.1 x10E3/uL   Monocytes Absolute 0.5 0.1 - 0.9 x10E3/uL   EOS (ABSOLUTE) 0.2 0.0 - 0.4 x10E3/uL   Basophils Absolute 0.0 0.0 - 0.2 x10E3/uL   Immature Granulocytes 0 Not Estab. %   Immature Grans (Abs) 0.0 0.0 - 0.1 x10E3/uL  Comprehensive metabolic panel  Result Value Ref Range   Glucose 86 65 - 99 mg/dL   BUN 9 6 - 20 mg/dL   Creatinine, Ser 0.77 0.57 - 1.00 mg/dL   GFR calc non Af Amer 108 >59 mL/min/1.73   GFR calc Af Amer 125 >59 mL/min/1.73   BUN/Creatinine Ratio 12 9 - 23   Sodium 142 134 - 144 mmol/L   Potassium 4.6 3.5 - 5.2 mmol/L   Chloride 102 96 - 106 mmol/L   CO2 25 18 - 29 mmol/L   Calcium 9.2 8.7 - 10.2 mg/dL   Total Protein 7.1 6.0 - 8.5 g/dL   Albumin 4.2 3.5 - 5.5 g/dL   Globulin, Total 2.9 1.5 - 4.5 g/dL   Albumin/Globulin Ratio 1.4 1.2 - 2.2   Bilirubin Total 1.4 (H) 0.0 - 1.2 mg/dL   Alkaline Phosphatase 81 39 - 117 IU/L   AST 15 0 - 40 IU/L   ALT 10 0 - 32 IU/L  TSH  Result Value Ref Range   TSH 1.360 0.450 - 4.500 uIU/mL  Lipid Panel w/o Chol/HDL Ratio  Result Value Ref Range   Cholesterol, Total 182 100 - 199 mg/dL   Triglycerides 114 0 - 149 mg/dL   HDL 47 >39 mg/dL    VLDL Cholesterol Cal 23 5 - 40 mg/dL   LDL Calculated 112 (H) 0 - 99 mg/dL  UA/M w/rflx Culture, Routine  Result Value Ref Range   Specific Gravity, UA 1.015 1.005 - 1.030   pH, UA 8.5 (H) 5.0 - 7.5   Color, UA Yellow Yellow   Appearance Ur Clear Clear   Leukocytes, UA Negative Negative   Protein, UA Negative Negative/Trace   Glucose, UA Negative Negative   Ketones, UA Negative Negative   RBC, UA Negative Negative   Bilirubin, UA Negative Negative   Urobilinogen, Ur 0.2  0.2 - 1.0 mg/dL   Nitrite, UA Negative Negative  Rheumatoid Factor  Result Value Ref Range   Rhuematoid fact SerPl-aCnc <10.0 0.0 - 13.9 IU/mL  Antinuclear Antib (ANA)  Result Value Ref Range   Anit Nuclear Antibody(ANA) Negative Negative  Sed Rate (ESR)  Result Value Ref Range   Sed Rate 2 0 - 32 mm/hr  C-reactive protein  Result Value Ref Range   CRP 1.6 0.0 - 4.9 mg/L      Assessment & Plan:   Problem List Items Addressed This Visit      Nervous and Auditory   Chronic tension-type headache, not intractable - Primary    Under good control. Will change to long acting for ease of taking. Call with any concerns. Recheck 6 months.       Relevant Medications   Topiramate ER (TROKENDI XR) 100 MG CP24    Other Visit Diagnoses    Sore throat       Throat looks clear. Will monitor and watch symptoms to see if GERD or Allergies. Call with any concerns.        Follow up plan: Return in about 6 months (around 10/12/2016) for Physical and follow up headaches.

## 2016-04-12 ENCOUNTER — Encounter: Payer: Self-pay | Admitting: Family Medicine

## 2016-04-12 ENCOUNTER — Encounter: Payer: Self-pay | Admitting: Obstetrics and Gynecology

## 2016-04-12 ENCOUNTER — Ambulatory Visit (INDEPENDENT_AMBULATORY_CARE_PROVIDER_SITE_OTHER): Payer: 59 | Admitting: Obstetrics and Gynecology

## 2016-04-12 ENCOUNTER — Ambulatory Visit (INDEPENDENT_AMBULATORY_CARE_PROVIDER_SITE_OTHER): Payer: 59 | Admitting: Family Medicine

## 2016-04-12 VITALS — BP 117/76 | HR 69 | Temp 98.8°F | Resp 17 | Ht 61.5 in | Wt 199.0 lb

## 2016-04-12 VITALS — BP 110/70 | HR 53 | Ht 61.0 in | Wt 201.0 lb

## 2016-04-12 DIAGNOSIS — Z1231 Encounter for screening mammogram for malignant neoplasm of breast: Secondary | ICD-10-CM | POA: Diagnosis not present

## 2016-04-12 DIAGNOSIS — E669 Obesity, unspecified: Secondary | ICD-10-CM

## 2016-04-12 DIAGNOSIS — G44229 Chronic tension-type headache, not intractable: Secondary | ICD-10-CM

## 2016-04-12 DIAGNOSIS — Z6837 Body mass index (BMI) 37.0-37.9, adult: Secondary | ICD-10-CM

## 2016-04-12 DIAGNOSIS — J029 Acute pharyngitis, unspecified: Secondary | ICD-10-CM | POA: Diagnosis not present

## 2016-04-12 DIAGNOSIS — Z01419 Encounter for gynecological examination (general) (routine) without abnormal findings: Secondary | ICD-10-CM

## 2016-04-12 DIAGNOSIS — Z124 Encounter for screening for malignant neoplasm of cervix: Secondary | ICD-10-CM | POA: Diagnosis not present

## 2016-04-12 DIAGNOSIS — Z1239 Encounter for other screening for malignant neoplasm of breast: Secondary | ICD-10-CM

## 2016-04-12 MED ORDER — PHENTERMINE HCL 37.5 MG PO TABS: 37.5000 mg | ORAL_TABLET | Freq: Every day | ORAL | 0 refills | Status: DC

## 2016-04-12 MED ORDER — TOPIRAMATE ER 100 MG PO CAP24: 100.0000 mg | ORAL_CAPSULE | Freq: Every day | ORAL | 1 refills | Status: DC

## 2016-04-12 NOTE — Progress Notes (Signed)
Patient ID: Breanna Lamb, female   DOB: Feb 22, 1990, 26 y.o.   MRN: [id number]     Gynecology Annual Exam  PCP: Park Liter, DO  Chief Complaint:  Chief Complaint  Patient presents with  . Gynecologic Exam    concerned about weight    History of Present Illness: Patient is a 26 y.o. [id number] presents for annual exam. The patient has no complaints today.   LMP: No LMP recorded. Patient is not currently having periods (Reason: IUD). No bleeding concerns using Mirena IUD  The patient is sexually active. She currently uses IUD: Present: yes for contraception. She denies dyspareunia.  The patient occasionally perform self breast exams.  There is no notable family history of breast or ovarian cancer in her family.  The patient wears seatbelts: yes.  The patient has regular exercise: no.    The patient denies current symptoms of depression.     Patientis a 26 y.o. [id number] female, who presents for the evaluation of weight gain. She has gained 40 pounds primarily over last year. The patient states the following issues have contributed to her weight problem: decreased activity, diet.  The patient has no additional symptoms. The patient specifically denies memory loss, muscle weakness, excessive thirst, and polyuria. Weight related co-morbidities include none. The patient's past medical history is notable for non-contributory. TShe has tried phentermine in the past with good success.  Review of Systems: Review of Systems  Constitutional: Negative for chills and fever.  HENT: Negative for congestion.   Respiratory: Negative for cough and shortness of breath.   Cardiovascular: Negative for chest pain and palpitations.  Gastrointestinal: Negative for abdominal pain, constipation, diarrhea, heartburn, nausea and vomiting.  Genitourinary: Negative for dysuria, frequency and urgency.  Skin: Negative for itching and rash.  Neurological: Negative for dizziness and headaches.    Endo/Heme/Allergies: Negative for polydipsia.  Psychiatric/Behavioral: Negative for depression.    Past Medical History:  Past Medical History:  Diagnosis Date  . Allergic rhinitis   . Benign positional vertigo   . Bunion   . Contraceptive management   . Depression   . Genital herpes   . Henoch-Schonlein purpura ([place])   . Lymphadenopathy   . Neck pain   . Ovarian cyst, right   . Poor concentration   . Premenstrual dysphoric syndrome   . Premenstrual dysphoric syndrome   . Screening for venereal disease   . Skin lesion     Past Surgical History:  Past Surgical History:  Procedure Laterality Date  . BUNIONECTOMY  2010 and 2011  . CESAREAN SECTION    . mirena IUD  01/2014  . OVARIAN CYST REMOVAL  August 2014    Gynecologic History:  No LMP recorded. Patient is not currently having periods (Reason: IUD). Contraception: IUD Last Pap: Results were: no abnormalities 11/17/2013  Obstetric History: G2P2002  Family History:  Family History  Problem Relation Age of Onset  . Hypertension Mother   . Mental illness Mother     Depression, Anxiety  . Hypertension Father   . Hypertension Brother   . Hypertension Maternal Grandmother   . Diabetes Maternal Grandmother   . Hyperlipidemia Maternal Grandmother   . Heart disease Maternal Grandmother   . COPD Maternal Grandmother   . Kidney disease Maternal Grandmother   . Mental illness Maternal Grandmother   . Hypertension Sister   . Mental illness Sister   . Heart disease Maternal Grandfather   . Heart attack Maternal Grandfather   . Cancer Paternal Grandmother  Social History:  Social History   Social History  . Marital status: Single    Spouse name: N/A  . Number of children: N/A  . Years of education: N/A   Occupational History  . Not on file.   Social History Main Topics  . Smoking status: Never Smoker  . Smokeless tobacco: Never Used  . Alcohol use No  . Drug use: No  . Sexual activity: Yes    Birth  control/ protection: IUD   Other Topics Concern  . Not on file   Social History Narrative  . No narrative on file    Allergies:  Allergies  Allergen Reactions  . Nickel     Medications: Prior to Admission medications   Medication Sig Start Date End Date Taking? Authorizing Provider  cyclobenzaprine (FLEXERIL) 10 MG tablet  01/31/16   Historical Provider, MD  Topiramate ER (TROKENDI XR) 100 MG CP24 Take 100 mg by mouth daily. 04/12/16   Megan Annia Friendly, DO    Physical Exam Vitals: Blood pressure 110/70, pulse (!) 53, height 5\' 1"  (1.549 m), weight 201 lb (91.2 kg). Body mass index is 37.98 kg/m.  General: NAD HEENT: normocephalic, anicteric Thyroid: no enlargement, no palpable nodules Pulmonary: No increased work of breathing, CTAB Cardiovascular: RRR, distal pulses 2+ Breast: Breast symmetrical, no tenderness, no palpable nodules or masses, no skin or nipple retraction present, no nipple discharge.  No axillary or supraclavicular lymphadenopathy. Abdomen: NABS, soft, non-tender, non-distended.  Umbilicus without lesions.  No hepatomegaly, splenomegaly or masses palpable. No evidence of hernia  Genitourinary:  External: Normal external female genitalia.  Normal urethral meatus, normal  Bartholin's and Skene's glands.    Vagina: Normal vaginal mucosa, no evidence of prolapse.    Cervix: Grossly normal in appearance, no bleeding, IUD strings visualized  Uterus: Non-enlarged, mobile, normal contour.  No CMT  Adnexa: ovaries non-enlarged, no adnexal masses  Rectal: deferred  Lymphatic: no evidence of inguinal lymphadenopathy Extremities: no edema, erythema, or tenderness Neurologic: Grossly intact Psychiatric: mood appropriate, affect full  Female chaperone present for pelvic and breast  portions of the physical exam    Assessment: 26 y.o. [id number] annual exam and weight loss  Plan: Problem List Items Addressed This Visit    None    Visit Diagnoses    Obesity (BMI  35.0-39.9 without comorbidity)    -  Primary   Screening for malignant neoplasm of cervix       Relevant Orders   Pap IG w/ reflex to HPV when ASC-U   Breast screening       Encounter for gynecological examination without abnormal finding       Relevant Orders   Pap IG w/ reflex to HPV when ASC-U   BMI 37.0-37.9, adult         ANNUAL  1) 4) Gardasil Series discussed and if applicable offered to patient - Patient has previously completed 3 shot series   2) STI screening was offered and declined   3) ASCCP guidelines and rational discussed.  Patient opts for every 3 years screening interval  4) Contraception - using IUD   5) Follow up 1 year for routine annual exam  WEIGHT LOSS 1) 1500 Calorie ADA Diet  2) Patient education given regarding appropriate lifestyle changes for weight loss including: regular physical activity, healthy coping strategies, caloric restriction and healthy eating patterns.  3) Patient will be started on weight loss medication. The risks and benefits and side effects of medication, such as Adipex (Phenteramine) ,  Tenuate (Diethylproprion), Belviq (lorcarsin), Contrave (buproprion/naltrexone), Qsymia (phentermine/topiramate), and Saxenda (liraglutide) is discussed. The pros and cons of suppressing appetite and boosting metabolism is discussed. Risks of tolerence and addiction is discussed for selected agents discussed. Use of medicine will ne short term, such as 3-4 months at a time followed by a period of time off of the medicine to avoid these risks and side effects for Adipex, Qsymia, and Tenuate discussed. Pt to call with any negative side effects and agrees to keep follow up appts.  4) Comorbidity Screening - hypothyroidism screening, diabetes, and hyperlipidemia screening offered - TSH checked today  5) Encouraged weekly weight monitorig to track progress and sample 1 week food diary  6) Contraception - discussed that all weight loss drugs fall in to  pregnancy category X, patient currently has reliable contraception in the form of Mirena IUD  7) 15 minutes face-to-face; counseling/coordination of care > 50 percent of visit  8) Follow up in 4 weeks to assess response

## 2016-04-12 NOTE — Patient Instructions (Addendum)
 Preventive Care 18-39 Years, Female Preventive care refers to lifestyle choices and visits with your health care provider that can promote health and wellness. What does preventive care include?  A yearly physical exam. This is also called an annual well check.  Dental exams once or twice a year.  Routine eye exams. Ask your health care provider how often you should have your eyes checked.  Personal lifestyle choices, including:  Daily care of your teeth and gums.  Regular physical activity.  Eating a healthy diet.  Avoiding tobacco and drug use.  Limiting alcohol use.  Practicing safe sex.  Taking vitamin and mineral supplements as recommended by your health care provider. What happens during an annual well check? The services and screenings done by your health care provider during your annual well check will depend on your age, overall health, lifestyle risk factors, and family history of disease. Counseling  Your health care provider may ask you questions about your:  Alcohol use.  Tobacco use.  Drug use.  Emotional well-being.  Home and relationship well-being.  Sexual activity.  Eating habits.  Work and work environment.  Method of birth control.  Menstrual cycle.  Pregnancy history. Screening  You may have the following tests or measurements:  Height, weight, and BMI.  Diabetes screening. This is done by checking your blood sugar (glucose) after you have not eaten for a while (fasting).  Blood pressure.  Lipid and cholesterol levels. These may be checked every 5 years starting at age 20.  Skin check.  Hepatitis C blood test.  Hepatitis B blood test.  Sexually transmitted disease (STD) testing.  BRCA-related cancer screening. This may be done if you have a family history of breast, ovarian, tubal, or peritoneal cancers.  Pelvic exam and Pap test. This may be done every 3 years starting at age 21. Starting at age 30, this may be done  every 5 years if you have a Pap test in combination with an HPV test. Discuss your test results, treatment options, and if necessary, the need for more tests with your health care provider. Vaccines  Your health care provider may recommend certain vaccines, such as:  Influenza vaccine. This is recommended every year.  Tetanus, diphtheria, and acellular pertussis (Tdap, Td) vaccine. You may need a Td booster every 10 years.  Varicella vaccine. You may need this if you have not been vaccinated.  HPV vaccine. If you are 26 or younger, you may need three doses over 6 months.  Measles, mumps, and rubella (MMR) vaccine. You may need at least one dose of MMR. You may also need a second dose.  Pneumococcal 13-valent conjugate (PCV13) vaccine. You may need this if you have certain conditions and were not previously vaccinated.  Pneumococcal polysaccharide (PPSV23) vaccine. You may need one or two doses if you smoke cigarettes or if you have certain conditions.  Meningococcal vaccine. One dose is recommended if you are age 19-21 years and a first-year college student living in a residence hall, or if you have one of several medical conditions. You may also need additional booster doses.  Hepatitis A vaccine. You may need this if you have certain conditions or if you travel or work in places where you may be exposed to hepatitis A.  Hepatitis B vaccine. You may need this if you have certain conditions or if you travel or work in places where you may be exposed to hepatitis B.  Haemophilus influenzae type b (Hib) vaccine. You may need   this if you have certain risk factors. Talk to your health care provider about which screenings and vaccines you need and how often you need them. This information is not intended to replace advice given to you by your health care provider. Make sure you discuss any questions you have with your health care provider. Document Released: 02/19/2001 Document Revised:  09/13/2015 Document Reviewed: 10/25/2014 Elsevier Interactive Patient Education  2017 [employer] In Calorie Counting for Weight Loss Calories are units of energy. Your body needs a certain amount of calories from food to keep you going throughout the day. When you eat more calories than your body needs, your body stores the extra calories as fat. When you eat fewer calories than your body needs, your body burns fat to get the energy it needs. Calorie counting means keeping track of how many calories you eat and drink each day. Calorie counting can be helpful if you need to lose weight. If you make sure to eat fewer calories than your body needs, you should lose weight. Ask your health care provider what a healthy weight is for you. For calorie counting to work, you will need to eat the right number of calories in a day in order to lose a healthy amount of weight per week. A dietitian can help you determine how many calories you need in a day and will give you suggestions on how to reach your calorie goal.  A healthy amount of weight to lose per week is usually 1-2 lb (0.5-0.9 kg). This usually means that your daily calorie intake should be reduced by 500-750 calories.  Eating 1,200 - 1,500 calories per day can help most women lose weight.  Eating 1,500 - 1,800 calories per day can help most men lose weight. What is my plan? My goal is to have __________ calories per day. If I have this many calories per day, I should lose around __________ pounds per week. What do I need to know about calorie counting? In order to meet your daily calorie goal, you will need to:  Find out how many calories are in each food you would like to eat. Try to do this before you eat.  Decide how much of the food you plan to eat.  Write down what you ate and how many calories it had. Doing this is called keeping a food log. To successfully lose weight, it is important to balance calorie counting with a healthy lifestyle  that includes regular activity. Aim for 150 minutes of moderate exercise (such as walking) or 75 minutes of vigorous exercise (such as running) each week. Where do I find calorie information?   The number of calories in a food can be found on a Nutrition Facts label. If a food does not have a Nutrition Facts label, try to look up the calories online or ask your dietitian for help. Remember that calories are listed per serving. If you choose to have more than one serving of a food, you will have to multiply the calories per serving by the amount of servings you plan to eat. For example, the label on a package of bread might say that a serving size is 1 slice and that there are 90 calories in a serving. If you eat 1 slice, you will have eaten 90 calories. If you eat 2 slices, you will have eaten 180 calories. How do I keep a food log? Immediately after each meal, record the following information in your food log:  What  you ate. Don't forget to include toppings, sauces, and other extras on the food.  How much you ate. This can be measured in cups, ounces, or number of items.  How many calories each food and drink had.  The total number of calories in the meal. Keep your food log near you, such as in a small notebook in your pocket, or use a mobile app or website. Some programs will calculate calories for you and show you how many calories you have left for the day to meet your goal. What are some calorie counting tips?  Use your calories on foods and drinks that will fill you up and not leave you hungry:  Some examples of foods that fill you up are nuts and nut butters, vegetables, lean proteins, and high-fiber foods like whole grains. High-fiber foods are foods with more than 5 g fiber per serving.  Drinks such as sodas, specialty coffee drinks, alcohol, and juices have a lot of calories, yet do not fill you up.  Eat nutritious foods and avoid empty calories. Empty calories are calories you get  from foods or beverages that do not have many vitamins or protein, such as candy, sweets, and soda. It is better to have a nutritious high-calorie food (such as an avocado) than a food with few nutrients (such as a bag of chips).  Know how many calories are in the foods you eat most often. This will help you calculate calorie counts faster.  Pay attention to calories in drinks. Low-calorie drinks include water and unsweetened drinks.  Pay attention to nutrition labels for "low fat" or "fat free" foods. These foods sometimes have the same amount of calories or more calories than the full fat versions. They also often have added sugar, starch, or salt, to make up for flavor that was removed with the fat.  Find a way of tracking calories that works for you. Get creative. Try different apps or programs if writing down calories does not work for you. What are some portion control tips?  Know how many calories are in a serving. This will help you know how many servings of a certain food you can have.  Use a measuring cup to measure serving sizes. You could also try weighing out portions on a kitchen scale. With time, you will be able to estimate serving sizes for some foods.  Take some time to put servings of different foods on your favorite plates, bowls, and cups so you know what a serving looks like.  Try not to eat straight from a bag or box. Doing this can lead to overeating. Put the amount you would like to eat in a cup or on a plate to make sure you are eating the right portion.  Use smaller plates, glasses, and bowls to prevent overeating.  Try not to multitask (for example, watch TV or use your computer) while eating. If it is time to eat, sit down at a table and enjoy your food. This will help you to know when you are full. It will also help you to be aware of what you are eating and how much you are eating. What are tips for following this plan? Reading food labels   Check the calorie  count compared to the serving size. The serving size may be smaller than what you are used to eating.  Check the source of the calories. Make sure the food you are eating is high in vitamins and protein and low in  saturated and trans fats. Shopping   Read nutrition labels while you shop. This will help you make healthy decisions before you decide to purchase your food.  Make a grocery list and stick to it. Cooking   Try to cook your favorite foods in a healthier way. For example, try baking instead of frying.  Use low-fat dairy products. Meal planning   Use more fruits and vegetables. Half of your plate should be fruits and vegetables.  Include lean proteins like poultry and fish. How do I count calories when eating out?  Ask for smaller portion sizes.  Consider sharing an entree and sides instead of getting your own entree.  If you get your own entree, eat only half. Ask for a box at the beginning of your meal and put the rest of your entree in it so you are not tempted to eat it.  If calories are listed on the menu, choose the lower calorie options.  Choose dishes that include vegetables, fruits, whole grains, low-fat dairy products, and lean protein.  Choose items that are boiled, broiled, grilled, or steamed. Stay away from items that are buttered, battered, fried, or served with cream sauce. Items labeled "crispy" are usually fried, unless stated otherwise.  Choose water, low-fat milk, unsweetened iced tea, or other drinks without added sugar. If you want an alcoholic beverage, choose a lower calorie option such as a glass of wine or light beer.  Ask for dressings, sauces, and syrups on the side. These are usually high in calories, so you should limit the amount you eat.  If you want a salad, choose a garden salad and ask for grilled meats. Avoid extra toppings like bacon, cheese, or fried items. Ask for the dressing on the side, or ask for olive oil and vinegar or lemon to  use as dressing.  Estimate how many servings of a food you are given. For example, a serving of cooked rice is  cup or about the size of half a baseball. Knowing serving sizes will help you be aware of how much food you are eating at restaurants. The list below tells you how big or small some common portion sizes are based on everyday objects:  1 oz-4 stacked dice.  3 oz-1 deck of cards.  1 tsp-1 die.  1 Tbsp- a ping-pong ball.  2 Tbsp-1 ping-pong ball.   cup- baseball.  1 cup-1 baseball. Summary  Calorie counting means keeping track of how many calories you eat and drink each day. If you eat fewer calories than your body needs, you should lose weight.  A healthy amount of weight to lose per week is usually 1-2 lb (0.5-0.9 kg). This usually means reducing your daily calorie intake by 500-750 calories.  The number of calories in a food can be found on a Nutrition Facts label. If a food does not have a Nutrition Facts label, try to look up the calories online or ask your dietitian for help.  Use your calories on foods and drinks that will fill you up, and not on foods and drinks that will leave you hungry.  Use smaller plates, glasses, and bowls to prevent overeating. This information is not intended to replace advice given to you by your health care provider. Make sure you discuss any questions you have with your health care provider. Document Released: 12/24/2004 Document Revised: 11/24/2015 Document Reviewed: 11/24/2015 Elsevier Interactive Patient Education  2017 [employer].

## 2016-04-12 NOTE — Assessment & Plan Note (Signed)
Under good control. Will change to long acting for ease of taking. Call with any concerns. Recheck 6 months.

## 2016-04-13 LAB — TSH: TSH: 1.42 u[IU]/mL (ref 0.450–4.500)

## 2016-04-15 LAB — PAP IG W/ RFLX HPV ASCU: PAP SMEAR COMMENT: 0

## 2016-05-14 ENCOUNTER — Ambulatory Visit: Payer: 59 | Admitting: Obstetrics and Gynecology

## 2016-06-13 ENCOUNTER — Encounter: Payer: Self-pay | Admitting: Obstetrics and Gynecology

## 2016-06-13 ENCOUNTER — Ambulatory Visit (INDEPENDENT_AMBULATORY_CARE_PROVIDER_SITE_OTHER): Payer: 59 | Admitting: Obstetrics and Gynecology

## 2016-06-13 VITALS — BP 108/78 | HR 68 | Ht 61.0 in | Wt 189.0 lb

## 2016-06-13 DIAGNOSIS — E669 Obesity, unspecified: Secondary | ICD-10-CM | POA: Diagnosis not present

## 2016-06-13 DIAGNOSIS — Z6835 Body mass index (BMI) 35.0-35.9, adult: Secondary | ICD-10-CM | POA: Diagnosis not present

## 2016-06-13 MED ORDER — PHENTERMINE HCL 37.5 MG PO TABS: 37.5000 mg | ORAL_TABLET | Freq: Every day | ORAL | 0 refills | Status: DC

## 2016-06-13 NOTE — Progress Notes (Signed)
Gynecology Office Visit  Chief Complaint:  Chief Complaint  Patient presents with  . Weight Check    History of Present Illness: Patientis a 26 y.o. G19P2002 female, who presents for the evaluation of the desire to lose weight. She has lost 12 pounds in her first month. The patient states the following symptoms since starting her weight loss therapy: appetite suppression, energy, and weight loss.  The patient also reports no other ill effects. The patient specifically denies heart palpitations, anxiety, and insomnia.    Review of Systems: 10 point review of systems negative unless otherwise noted in HPI  Past Medical History:  Past Medical History:  Diagnosis Date  . Allergic rhinitis   . Benign positional vertigo   . Bunion   . Contraceptive management   . Depression   . Genital herpes   . Henoch-Schonlein purpura ([place])   . Lymphadenopathy   . Neck pain   . Obesity   . Ovarian cyst, right   . Poor concentration   . Premenstrual dysphoric syndrome   . Premenstrual dysphoric syndrome   . Screening for venereal disease   . Skin lesion     Past Surgical History:  Past Surgical History:  Procedure Laterality Date  . BUNIONECTOMY  2010 and 2011  . CESAREAN SECTION    . mirena IUD  01/2014  . OVARIAN CYST REMOVAL  August 2014    Gynecologic History: No LMP recorded. Patient is not currently having periods (Reason: IUD).  Obstetric History: [id number]  Family History:  Family History  Problem Relation Age of Onset  . Hypertension Mother   . Mental illness Mother        Depression, Anxiety  . Hypertension Father   . Hypertension Brother   . Hypertension Maternal Grandmother   . Diabetes Maternal Grandmother   . Hyperlipidemia Maternal Grandmother   . Heart disease Maternal Grandmother   . COPD Maternal Grandmother   . Kidney disease Maternal Grandmother   . Mental illness Maternal Grandmother   . Hypertension Sister   . Mental illness Sister   . Heart  disease Maternal Grandfather   . Heart attack Maternal Grandfather   . Cancer Paternal Grandmother     Social History:  Social History   Social History  . Marital status: Single    Spouse name: N/A  . Number of children: N/A  . Years of education: N/A   Occupational History  . Not on file.   Social History Main Topics  . Smoking status: Never Smoker  . Smokeless tobacco: Never Used  . Alcohol use No  . Drug use: No  . Sexual activity: Yes    Birth control/ protection: IUD   Other Topics Concern  . Not on file   Social History Narrative  . No narrative on file    Allergies:  Allergies  Allergen Reactions  . Nickel     Medications: Prior to Admission medications   Medication Sig Start Date End Date Taking? Authorizing Provider  cyclobenzaprine (FLEXERIL) 10 MG tablet  01/31/16  Yes [provider]  levonorgestrel (MIRENA) 20 MCG/24HR IUD 1 each by Intrauterine route once.   Yes [provider]  phentermine (ADIPEX-P) 37.5 MG tablet Take 1 tablet (37.5 mg total) by mouth daily before breakfast. 04/12/16  Yes [name], MD  sertraline (ZOLOFT) 25 MG tablet Take 25 mg by mouth daily. 1 tablet BID   Yes [provider]  Topiramate ER (TROKENDI XR) 100 MG CP24 Take  100 mg by mouth daily. 04/12/16  Yes Park Liter P, DO    Physical Exam Vitals:  Vitals:   06/13/16 0810  BP: 108/78  Pulse: 68   No LMP recorded. Patient is not currently having periods (Reason: IUD). Filed Weights   06/13/16 0810  Weight: 189 lb (85.7 kg)   Body mass index is 35.71 kg/m.  General: NAD HEENT: normocephalic, anicteric Thyroid: no enlargement Pulmonary: no increased work of breathing Neurologic: Grossly intact Psychiatric: mood appropriate, affect full  Assessment: 26 y.o. [id number] medical weight loss follow up  Plan: Problem List Items Addressed This Visit    None    Visit Diagnoses    Class 2 obesity without serious comorbidity with body  mass index (BMI) of 35.0 to 35.9 in adult, unspecified obesity type    -  Primary      1) 1500 Calorie ADA Diet  2) Patient education given regarding appropriate lifestyle changes for weight loss including: regular physical activity, healthy coping strategies, caloric restriction and healthy eating patterns.  3) Patient will be started on weight loss medication. The risks and benefits and side effects of medication, such as Adipex (Phenteramine) ,  Tenuate (Diethylproprion), Belviq (lorcarsin), Contrave (buproprion/naltrexone), Qsymia (phentermine/topiramate), and Saxenda (liraglutide) is discussed. The pros and cons of suppressing appetite and boosting metabolism is discussed. Risks of tolerence and addiction is discussed for selected agents discussed. Use of medicine will ne short term, such as 3-4 months at a time followed by a period of time off of the medicine to avoid these risks and side effects for Adipex, Qsymia, and Tenuate discussed. Pt to call with any negative side effects and agrees to keep follow up appts.  4) Patient to take medication, with the benefits of appetite suppression and metabolism boost d/w pt, along with the side effects and risk factors of long term use that will be avoided with our use of short bursts of therapy. Rx provided.    5) 15 minutes face-to-face; with counseling/coordination of care > 50 percent of visit related to obesity and ongoing management/treatment   6) Follow up in 4 weeks to assess response

## 2016-07-08 ENCOUNTER — Ambulatory Visit (INDEPENDENT_AMBULATORY_CARE_PROVIDER_SITE_OTHER): Payer: 59 | Admitting: Obstetrics and Gynecology

## 2016-07-08 ENCOUNTER — Encounter: Payer: Self-pay | Admitting: Obstetrics and Gynecology

## 2016-07-08 VITALS — BP 102/66 | HR 98 | Ht 61.0 in | Wt 183.0 lb

## 2016-07-08 DIAGNOSIS — E669 Obesity, unspecified: Secondary | ICD-10-CM

## 2016-07-08 DIAGNOSIS — Z6834 Body mass index (BMI) 34.0-34.9, adult: Secondary | ICD-10-CM | POA: Diagnosis not present

## 2016-07-08 MED ORDER — PHENTERMINE HCL 37.5 MG PO TABS: 37.5000 mg | ORAL_TABLET | Freq: Every day | ORAL | 0 refills | Status: DC

## 2016-07-08 NOTE — Progress Notes (Signed)
Gynecology Office Visit  Chief Complaint:  Chief Complaint  Patient presents with  . Weight Check    History of Present Illness: Patientis a 26 y.o. G77P2002 female, who presents for the evaluation of the desire to lose weight. She has lost 5 pounds, for 2 month total of 17lbs. The patient states the following symptoms since starting her weight loss therapy: appetite suppression, energy, and weight loss.  The patient also reports no other ill effects. The patient specifically denies heart palpitations, anxiety, and insomnia.    Review of Systems: 10 point review of systems negative unless otherwise noted in HPI  Past Medical History:  Past Medical History:  Diagnosis Date  . Allergic rhinitis   . Benign positional vertigo   . Bunion   . Contraceptive management   . Depression   . Genital herpes   . Henoch-Schonlein purpura ([place])   . Lymphadenopathy   . Neck pain   . Obesity   . Ovarian cyst, right   . Poor concentration   . Premenstrual dysphoric syndrome   . Premenstrual dysphoric syndrome   . Screening for venereal disease   . Skin lesion     Past Surgical History:  Past Surgical History:  Procedure Laterality Date  . BUNIONECTOMY  2010 and 2011  . CESAREAN SECTION    . mirena IUD  01/2014  . OVARIAN CYST REMOVAL  August 2014    Gynecologic History: No LMP recorded. Patient is not currently having periods (Reason: IUD).  Obstetric History: [id number]  Family History:  Family History  Problem Relation Age of Onset  . Hypertension Mother   . Mental illness Mother        Depression, Anxiety  . Hypertension Father   . Hypertension Brother   . Hypertension Maternal Grandmother   . Diabetes Maternal Grandmother   . Hyperlipidemia Maternal Grandmother   . Heart disease Maternal Grandmother   . COPD Maternal Grandmother   . Kidney disease Maternal Grandmother   . Mental illness Maternal Grandmother   . Hypertension Sister   . Mental illness Sister   .  Heart disease Maternal Grandfather   . Heart attack Maternal Grandfather   . Cancer Paternal Grandmother     Social History:  Social History   Social History  . Marital status: Single    Spouse name: N/A  . Number of children: N/A  . Years of education: N/A   Occupational History  . Not on file.   Social History Main Topics  . Smoking status: Never Smoker  . Smokeless tobacco: Never Used  . Alcohol use No  . Drug use: No  . Sexual activity: Yes    Birth control/ protection: IUD   Other Topics Concern  . Not on file   Social History Narrative  . No narrative on file    Allergies:  Allergies  Allergen Reactions  . Nickel     Medications: Prior to Admission medications   Medication Sig Start Date End Date Taking? Authorizing Provider  cyclobenzaprine (FLEXERIL) 10 MG tablet  01/31/16   [provider]  levonorgestrel (MIRENA) 20 MCG/24HR IUD 1 each by Intrauterine route once.    [provider]  phentermine (ADIPEX-P) 37.5 MG tablet Take 1 tablet (37.5 mg total) by mouth daily before breakfast. 06/13/16   [name], MD  sertraline (ZOLOFT) 25 MG tablet Take 25 mg by mouth daily. 1 tablet BID    [provider]  Topiramate ER (TROKENDI XR) 100 MG  CP24 Take 100 mg by mouth daily. 04/12/16   [name], DO    Physical Exam Vitals:  Blood pressure 102/66, pulse 98, height 5\' 1"  (1.549 m), weight 183 lb (83 kg). No LMP recorded. Patient is not currently having periods (Reason: IUD). Filed Weights   07/08/16 1009  Weight: 183 lb (83 kg)   Body mass index is 34.58 kg/m.  General: NAD HEENT: normocephalic, anicteric Thyroid: no enlargement Pulmonary: no increased work of breathing Neurologic: Grossly intact Psychiatric: mood appropriate, affect full  Assessment: 26 y.o. [id number] medical weight loss management  Plan: Problem List Items Addressed This Visit    None      1) 1500 Calorie ADA Diet  2) Patient education  given regarding appropriate lifestyle changes for weight loss including: regular physical activity, healthy coping strategies, caloric restriction and healthy eating patterns.  3) Patient will be started on weight loss medication. The risks and benefits and side effects of medication, such as Adipex (Phenteramine) ,  Tenuate (Diethylproprion), Belviq (lorcarsin), Contrave (buproprion/naltrexone), Qsymia (phentermine/topiramate), and Saxenda (liraglutide) is discussed. The pros and cons of suppressing appetite and boosting metabolism is discussed. Risks of tolerence and addiction is discussed for selected agents discussed. Use of medicine will ne short term, such as 3-4 months at a time followed by a period of time off of the medicine to avoid these risks and side effects for Adipex, Qsymia, and Tenuate discussed. Pt to call with any negative side effects and agrees to keep follow up appts.  4) Patient to take medication, with the benefits of appetite suppression and metabolism boost d/w pt, along with the side effects and risk factors of long term use that will be avoided with our use of short bursts of therapy. Rx provided.    5) 15 minutes face-to-face; with counseling/coordination of care > 50 percent of visit related to obesity and ongoing management/treatment   6) Follow up in 4 weeks to assess response

## 2016-07-31 ENCOUNTER — Other Ambulatory Visit: Payer: Self-pay | Admitting: Family Medicine

## 2016-08-07 ENCOUNTER — Ambulatory Visit: Payer: 59 | Admitting: Obstetrics and Gynecology

## 2016-08-15 ENCOUNTER — Encounter: Payer: Self-pay | Admitting: Family Medicine

## 2016-08-15 ENCOUNTER — Other Ambulatory Visit: Payer: Self-pay

## 2016-08-15 ENCOUNTER — Ambulatory Visit (INDEPENDENT_AMBULATORY_CARE_PROVIDER_SITE_OTHER): Payer: 59 | Admitting: Family Medicine

## 2016-08-15 VITALS — BP 108/72 | HR 91 | Temp 98.2°F | Wt 176.0 lb

## 2016-08-15 DIAGNOSIS — R000 Tachycardia, unspecified: Secondary | ICD-10-CM

## 2016-08-15 DIAGNOSIS — R42 Dizziness and giddiness: Secondary | ICD-10-CM

## 2016-08-15 NOTE — Progress Notes (Signed)
   BP 108/72   Pulse 91   Temp 98.2 F (36.8 C)   Wt 176 lb (79.8 kg)   SpO2 99%   BMI 33.25 kg/m    Subjective:    Patient ID: Breanna Lamb, female    DOB: September 30, 1990, 26 y.o.   MRN: [id number]  HPI: Breanna Lamb is a 26 y.o. female  Chief Complaint  Patient presents with  . Dizziness    x 2 months. 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 Occasional SOB  with exertion.   Patient presents with 2 months of dizziness that occurs when she stands up and lasts a short period of time. Denies diaphoresis, palpitations, or syncope during these episodes. Has been on phentermine for about 3 months now, stopped it for 2 weeks but no improvement of sxs. Had orthostatic vitals taken at her job and they noted a significant change this morning when taken. Has been drinking plenty of water and eating a balanced diet. No new supplements. Of note, since starting the phentermine has lost between 15-20 lb.   Relevant past medical, surgical, family and social history reviewed and updated as indicated. Interim medical history since our last visit reviewed. Allergies and medications reviewed and updated.  Review of Systems  Constitutional: Negative.   Respiratory: Negative.   Cardiovascular: Negative.   Musculoskeletal: Negative.   Neurological: Positive for dizziness and light-headedness.  Psychiatric/Behavioral: Negative.    Per HPI unless specifically indicated above     Objective:    BP 108/72   Pulse 91   Temp 98.2 F (36.8 C)   Wt 176 lb (79.8 kg)   SpO2 99%   BMI 33.25 kg/m   Wt Readings from Last 3 Encounters:  08/15/16 176 lb (79.8 kg)  07/08/16 183 lb (83 kg)  06/13/16 189 lb (85.7 kg)    Physical Exam  Constitutional: She is oriented to person, place, and time. She appears well-developed and well-nourished. No distress.  HENT:  Head: Atraumatic.  Eyes: Pupils are equal, round, and  reactive to light. Conjunctivae are normal.  Neck: Normal range of motion. Neck supple.  Cardiovascular: Normal rate, regular rhythm, normal heart sounds and intact distal pulses.   Pulmonary/Chest: Effort normal and breath sounds normal. No respiratory distress.  Musculoskeletal: Normal range of motion.  Neurological: She is alert and oriented to person, place, and time.  Skin: Skin is warm and dry.  Psychiatric: She has a normal mood and affect. Her behavior is normal.  Nursing note and vitals reviewed.     Assessment & Plan:   Problem List Items Addressed This Visit    None    Visit Diagnoses    Dizziness    -  Primary   Relevant Orders   EKG 12-Lead (Completed)   CBC with Differential/Platelet (Completed)   Comprehensive metabolic panel (Completed)   TSH (Completed)    Orthostatics and EKG benign here in the office. Will check some basic labs. Discussed with pt that if labs come back normal and being off phentermine isn't helping, will refer to cardiology for POTS r/o and further evaluation. Continue good hydration, hold of phentermine, and balanced low glycemic index diet. Return precautions reviewed.   Follow up plan: Return if symptoms worsen or fail to improve.

## 2016-08-16 ENCOUNTER — Telehealth: Payer: Self-pay | Admitting: Family Medicine

## 2016-08-16 LAB — COMPREHENSIVE METABOLIC PANEL
ALBUMIN: 4.3 g/dL (ref 3.5–5.5)
ALK PHOS: 70 IU/L (ref 39–117)
ALT: 10 IU/L (ref 0–32)
AST: 15 IU/L (ref 0–40)
Albumin/Globulin Ratio: 1.6 (ref 1.2–2.2)
BILIRUBIN TOTAL: 1.3 mg/dL — AB (ref 0.0–1.2)
BUN / CREAT RATIO: 14 (ref 9–23)
BUN: 13 mg/dL (ref 6–20)
CHLORIDE: 102 mmol/L (ref 96–106)
CO2: 18 mmol/L — ABNORMAL LOW (ref 20–29)
Calcium: 8.9 mg/dL (ref 8.7–10.2)
Creatinine, Ser: 0.95 mg/dL (ref 0.57–1.00)
GFR calc non Af Amer: 84 mL/min/{1.73_m2} (ref >59)
GFR, EST AFRICAN AMERICAN: 96 mL/min/{1.73_m2} (ref >59)
GLOBULIN, TOTAL: 2.7 g/dL (ref 1.5–4.5)
Glucose: 61 mg/dL — ABNORMAL LOW (ref 65–99)
Potassium: 3.5 mmol/L (ref 3.5–5.2)
SODIUM: 138 mmol/L (ref 134–144)
Total Protein: 7 g/dL (ref 6.0–8.5)

## 2016-08-16 LAB — CBC WITH DIFFERENTIAL/PLATELET
BASOS ABS: 0 10*3/uL (ref 0.0–0.2)
BASOS: 0 %
EOS (ABSOLUTE): 0.1 10*3/uL (ref 0.0–0.4)
Eos: 2 %
HEMATOCRIT: 40.6 % (ref 34.0–46.6)
HEMOGLOBIN: 13.8 g/dL (ref 11.1–15.9)
Immature Grans (Abs): 0 10*3/uL (ref 0.0–0.1)
Immature Granulocytes: 0 %
LYMPHS ABS: 2.1 10*3/uL (ref 0.7–3.1)
Lymphs: 39 %
MCH: 28.5 pg (ref 26.6–33.0)
MCHC: 34 g/dL (ref 31.5–35.7)
MCV: 84 fL (ref 79–97)
MONOCYTES: 10 %
Monocytes Absolute: 0.5 10*3/uL (ref 0.1–0.9)
NEUTROS ABS: 2.6 10*3/uL (ref 1.4–7.0)
Neutrophils: 49 %
Platelets: 253 10*3/uL (ref 150–379)
RBC: 4.85 x10E6/uL (ref 3.77–5.28)
RDW: 13.6 % (ref 12.3–15.4)
WBC: 5.3 10*3/uL (ref 3.4–10.8)

## 2016-08-16 LAB — TSH: TSH: 1.67 u[IU]/mL (ref 0.450–4.500)

## 2016-08-16 NOTE — Telephone Encounter (Signed)
Please call and let her know that her labs were normal except her glucose was very low at 61. Please ask if she'd eaten that day. Suspect she may be getting dizzy from hypoglycemia and may need to increase her dietary intake with good low glycemic index foods.

## 2016-08-16 NOTE — Telephone Encounter (Signed)
Patient notified

## 2016-08-18 NOTE — Patient Instructions (Signed)
Follow up as needed

## 2016-09-05 ENCOUNTER — Telehealth: Payer: Self-pay | Admitting: Family Medicine

## 2016-09-05 NOTE — Telephone Encounter (Signed)
Patient was biten by tick on Sunday and has a bullseye area where the tick was removed.  She is wanting to know if Dr Wynetta Emery would call her in antibiotic.  Breanna Lamb can be reached at [phone] Thanks

## 2016-09-05 NOTE — Telephone Encounter (Signed)
Spoke with patient and let her know that antibiotics would not be started until at least 2-3 weeks after the tick was removed. Patient understood.

## 2016-10-02 ENCOUNTER — Encounter: Payer: Self-pay | Admitting: Pulmonary Disease

## 2016-10-02 ENCOUNTER — Other Ambulatory Visit: Payer: Self-pay | Admitting: Pulmonary Disease

## 2016-10-02 MED ORDER — AZITHROMYCIN 250 MG PO TABS: ORAL_TABLET | ORAL | 0 refills | Status: DC

## 2016-11-15 ENCOUNTER — Encounter: Payer: 59 | Admitting: Family Medicine

## 2017-04-18 ENCOUNTER — Ambulatory Visit (INDEPENDENT_AMBULATORY_CARE_PROVIDER_SITE_OTHER): Payer: 59 | Admitting: Family Medicine

## 2017-04-18 ENCOUNTER — Encounter: Payer: Self-pay | Admitting: Family Medicine

## 2017-04-18 VITALS — BP 127/75 | HR 67 | Temp 98.4°F | Ht 61.0 in | Wt 223.3 lb

## 2017-04-18 DIAGNOSIS — M2550 Pain in unspecified joint: Secondary | ICD-10-CM | POA: Diagnosis not present

## 2017-04-18 DIAGNOSIS — R591 Generalized enlarged lymph nodes: Secondary | ICD-10-CM

## 2017-04-18 DIAGNOSIS — R635 Abnormal weight gain: Secondary | ICD-10-CM | POA: Diagnosis not present

## 2017-04-18 DIAGNOSIS — F3289 Other specified depressive episodes: Secondary | ICD-10-CM | POA: Diagnosis not present

## 2017-04-18 DIAGNOSIS — F3281 Premenstrual dysphoric disorder: Secondary | ICD-10-CM

## 2017-04-18 DIAGNOSIS — M722 Plantar fascial fibromatosis: Secondary | ICD-10-CM | POA: Diagnosis not present

## 2017-04-18 LAB — BAYER DCA HB A1C WAIVED: HB A1C (BAYER DCA - WAIVED): 4.9 % (ref <7.0)

## 2017-04-18 MED ORDER — FLUOXETINE HCL 20 MG PO CAPS: 20.0000 mg | ORAL_CAPSULE | Freq: Every day | ORAL | 3 refills | Status: DC

## 2017-04-18 NOTE — Assessment & Plan Note (Signed)
Not under good control. Will restart prozac and recheck 1 month. Call with any concerns.

## 2017-04-18 NOTE — Patient Instructions (Addendum)
Belviq, Contrave, Saxenda- weight loss medicine   Plantar Fasciitis Plantar fasciitis is a painful foot condition that affects the heel. It occurs when the band of tissue that connects the toes to the heel bone (plantar fascia) becomes irritated. This can happen after exercising too much or doing other repetitive activities (overuse injury). The pain from plantar fasciitis can range from mild irritation to severe pain that makes it difficult for you to walk or move. The pain is usually worse in the morning or after you have been sitting or lying down for a while. What are the causes? This condition may be caused by:  Standing for long periods of time.  Wearing shoes that do not fit.  Doing high-impact activities, including running, aerobics, and ballet.  Being overweight.  Having an abnormal way of walking (gait).  Having tight calf muscles.  Having high arches in your feet.  Starting a new athletic activity.  What are the signs or symptoms? The main symptom of this condition is heel pain. Other symptoms include:  Pain that gets worse after activity or exercise.  Pain that is worse in the morning or after resting.  Pain that goes away after you walk for a few minutes.  How is this diagnosed? This condition may be diagnosed based on your signs and symptoms. Your health care provider will also do a physical exam to check for:  A tender area on the bottom of your foot.  A high arch in your foot.  Pain when you move your foot.  Difficulty moving your foot.  You may also need to have imaging studies to confirm the diagnosis. These can include:  X-rays.  Ultrasound.  MRI.  How is this treated? Treatment for plantar fasciitis depends on the severity of the condition. Your treatment may include:  Rest, ice, and over-the-counter pain medicines to manage your pain.  Exercises to stretch your calves and your plantar fascia.  A splint that holds your foot in a  stretched, upward position while you sleep (night splint).  Physical therapy to relieve symptoms and prevent problems in the future.  Cortisone injections to relieve severe pain.  Extracorporeal shock wave therapy (ESWT) to stimulate damaged plantar fascia with electrical impulses. It is often used as a last resort before surgery.  Surgery, if other treatments have not worked after 12 months.  Follow these instructions at home:  Take medicines only as directed by your health care provider.  Avoid activities that cause pain.  Roll the bottom of your foot over a bag of ice or a bottle of cold water. Do this for 20 minutes, 3-4 times a day.  Perform simple stretches as directed by your health care provider.  Try wearing athletic shoes with air-sole or gel-sole cushions or soft shoe inserts.  Wear a night splint while sleeping, if directed by your health care provider.  Keep all follow-up appointments with your health care provider. How is this prevented?  Do not perform exercises or activities that cause heel pain.  Consider finding low-impact activities if you continue to have problems.  Lose weight if you need to. The best way to prevent plantar fasciitis is to avoid the activities that aggravate your plantar fascia. Contact a health care provider if:  Your symptoms do not go away after treatment with home care measures.  Your pain gets worse.  Your pain affects your ability to move or do your daily activities. This information is not intended to replace advice given to you  by your health care provider. Make sure you discuss any questions you have with your health care provider. Document Released: 09/18/2000 Document Revised: 05/29/2015 Document Reviewed: 11/03/2013 Elsevier Interactive Patient Education  2018 [employer].  Plantar Fasciitis Rehab Ask your health care provider which exercises are safe for you. Do exercises exactly as told by your health care provider and  adjust them as directed. It is normal to feel mild stretching, pulling, tightness, or discomfort as you do these exercises, but you should stop right away if you feel sudden pain or your pain gets worse. Do not begin these exercises until told by your health care provider. Stretching and range of motion exercises These exercises warm up your muscles and joints and improve the movement and flexibility of your foot. These exercises also help to relieve pain. Exercise A: Plantar fascia stretch  1. Sit with your left / right leg crossed over your opposite knee. 2. Hold your heel with one hand with that thumb near your arch. With your other hand, hold your toes and gently pull them back toward the top of your foot. You should feel a stretch on the bottom of your toes or your foot or both. 3. Hold this stretch for__________ seconds. 4. Slowly release your toes and return to the starting position. Repeat __________ times. Complete this exercise __________ times a day. Exercise B: Gastroc, standing  1. Stand with your hands against a wall. 2. Extend your left / right leg behind you, and bend your front knee slightly. 3. Keeping your heels on the floor and keeping your back knee straight, shift your weight toward the wall without arching your back. You should feel a gentle stretch in your left / right calf. 4. Hold this position for __________ seconds. Repeat __________ times. Complete this exercise __________ times a day. Exercise C: Soleus, standing 1. Stand with your hands against a wall. 2. Extend your left / right leg behind you, and bend your front knee slightly. 3. Keeping your heels on the floor, bend your back knee and slightly shift your weight over the back leg. You should feel a gentle stretch deep in your calf. 4. Hold this position for __________ seconds. Repeat __________ times. Complete this exercise __________ times a day. Exercise D: Gastrocsoleus, standing 1. Stand with the ball of  your left / right foot on a step. The ball of your foot is on the walking surface, right under your toes. 2. Keep your other foot firmly on the same step. 3. Hold onto the wall or a railing for balance. 4. Slowly lift your other foot, allowing your body weight to press your heel down over the edge of the step. You should feel a stretch in your left / right calf. 5. Hold this position for __________ seconds. 6. Return both feet to the step. 7. Repeat this exercise with a slight bend in your left / right knee. Repeat __________ times with your left / right knee straight and __________ times with your left / right knee bent. Complete this exercise __________ times a day. Balance exercise This exercise builds your balance and strength control of your arch to help take pressure off your plantar fascia. Exercise E: Single leg stand 1. Without shoes, stand near a railing or in a doorway. You may hold onto the railing or door frame as needed. 2. Stand on your left / right foot. Keep your big toe down on the floor and try to keep your arch lifted. Do not  let your foot roll inward. 3. Hold this position for __________ seconds. 4. If this exercise is too easy, you can try it with your eyes closed or while standing on a pillow. Repeat __________ times. Complete this exercise __________ times a day. This information is not intended to replace advice given to you by your health care provider. Make sure you discuss any questions you have with your health care provider. Document Released: 12/24/2004 Document Revised: 08/29/2015 Document Reviewed: 11/07/2014 Elsevier Interactive Patient Education  2018 [employer].

## 2017-04-18 NOTE — Progress Notes (Signed)
BP 127/75   Pulse 67   Temp 98.4 F (36.9 C) (Oral)   Ht 5\' 1"  (1.549 m)   Wt 223 lb 4.8 oz (101.3 kg)   SpO2 99%   BMI 42.19 kg/m    Subjective:    Patient ID: Breanna Lamb, female    DOB: 08-Oct-1990, 27 y.o.   MRN: [id number]  HPI: Breanna Lamb is a 27 y.o. female  Chief Complaint  Patient presents with  . Follow-up   Back in October, she came in and her BP was really low and her HR was really high. She had been on phentermine and zoloft. She has been off all medicine since October.   PMDD- she never noticed much of a difference with the zoloft, had been on the prozac for years previously with good results  Mood status: uncontrolled Satisfied with current treatment?: no Symptom severity: moderate  Duration of current treatment : off everything since October Side effects: no Medication compliance:  off everything since October Psychotherapy/counseling: no  Previous psychiatric medications: zoloft, prozac Depressed mood: yes Anxious mood: yes Anhedonia: no Significant weight loss or gain: no Insomnia: no  Fatigue: yes Feelings of worthlessness or guilt: yes Impaired concentration/indecisiveness: yes Suicidal ideations: no Hopelessness: no Crying spells: yes Depression screen [facility] 2/9 04/18/2017 01/31/2016  Decreased Interest 2 1  Down, Depressed, Hopeless 2 2  PHQ - 2 Score 4 3  Altered sleeping 1 3  Tired, decreased energy 2 2  Change in appetite 3 3  Feeling bad or failure about yourself  2 1  Trouble concentrating 3 2  Moving slowly or fidgety/restless 1 0  Suicidal thoughts 0 0  PHQ-9 Score 16 14   LUMP Duration: year Location: Left side under chin Onset: sudden Painful: no Discomfort: yes Status:  bigger Trauma: no Redness: no Bruising: no Recent infection: no Swollen lymph nodes: no Requesting removal: no  WEIGHT GAIN Duration: chronic Previous attempts at weight loss: yes Complications of obesity: none Peak weight:  current Requesting obesity pharmacotherapy: yes Current weight loss supplements/medications: no Previous weight loss supplements/meds: yes  Having a lot of pain in her heel.  Relevant past medical, surgical, family and social history reviewed and updated as indicated. Interim medical history since our last visit reviewed. Allergies and medications reviewed and updated.  Review of Systems  Constitutional: Negative.   HENT: Negative.   Respiratory: Negative.   Cardiovascular: Negative.   Musculoskeletal: Positive for arthralgias, back pain and myalgias. Negative for gait problem, joint swelling, neck pain and neck stiffness.  Skin: Negative.   Neurological: Negative.   Psychiatric/Behavioral: Negative.     Per HPI unless specifically indicated above     Objective:    BP 127/75   Pulse 67   Temp 98.4 F (36.9 C) (Oral)   Ht 5\' 1"  (1.549 m)   Wt 223 lb 4.8 oz (101.3 kg)   SpO2 99%   BMI 42.19 kg/m   Wt Readings from Last 3 Encounters:  04/18/17 223 lb 4.8 oz (101.3 kg)  08/15/16 176 lb (79.8 kg)  07/08/16 183 lb (83 kg)    Physical Exam  Constitutional: She is oriented to person, place, and time. She appears well-developed and well-nourished. No distress.  HENT:  Head: Normocephalic and atraumatic.  Right Ear: Hearing normal.  Left Ear: Hearing normal.  Nose: Nose normal.  Eyes: Conjunctivae and lids are normal. Right eye exhibits no discharge. Left eye exhibits no discharge. No scleral icterus.  Cardiovascular: Normal rate,  regular rhythm, normal heart sounds and intact distal pulses. Exam reveals no gallop and no friction rub.  No murmur heard. Pulmonary/Chest: Effort normal and breath sounds normal. No stridor. No respiratory distress. She has no wheezes. She has no rales. She exhibits no tenderness.  Musculoskeletal: Normal range of motion.  Neurological: She is alert and oriented to person, place, and time.  Skin: Skin is warm, dry and intact. Capillary refill  takes less than 2 seconds. No rash noted. She is not diaphoretic. No erythema. No pallor.  Psychiatric: She has a normal mood and affect. Her speech is normal and behavior is normal. Judgment and thought content normal. Cognition and memory are normal.  Nursing note and vitals reviewed.   Results for orders placed or performed in visit on 08/15/16  CBC with Differential/Platelet  Result Value Ref Range   WBC 5.3 3.4 - 10.8 x10E3/uL   RBC 4.85 3.77 - 5.28 x10E6/uL   Hemoglobin 13.8 11.1 - 15.9 g/dL   Hematocrit 40.6 34.0 - 46.6 %   MCV 84 79 - 97 fL   MCH 28.5 26.6 - 33.0 pg   MCHC 34.0 31.5 - 35.7 g/dL   RDW 13.6 12.3 - 15.4 %   Platelets 253 150 - 379 x10E3/uL   Neutrophils 49 Not Estab. %   Lymphs 39 Not Estab. %   Monocytes 10 Not Estab. %   Eos 2 Not Estab. %   Basos 0 Not Estab. %   Neutrophils Absolute 2.6 1.4 - 7.0 x10E3/uL   Lymphocytes Absolute 2.1 0.7 - 3.1 x10E3/uL   Monocytes Absolute 0.5 0.1 - 0.9 x10E3/uL   EOS (ABSOLUTE) 0.1 0.0 - 0.4 x10E3/uL   Basophils Absolute 0.0 0.0 - 0.2 x10E3/uL   Immature Granulocytes 0 Not Estab. %   Immature Grans (Abs) 0.0 0.0 - 0.1 x10E3/uL  Comprehensive metabolic panel  Result Value Ref Range   Glucose 61 (L) 65 - 99 mg/dL   BUN 13 6 - 20 mg/dL   Creatinine, Ser 0.95 0.57 - 1.00 mg/dL   GFR calc non Af Amer 84 >59 mL/min/1.73   GFR calc Af Amer 96 >59 mL/min/1.73   BUN/Creatinine Ratio 14 9 - 23   Sodium 138 134 - 144 mmol/L   Potassium 3.5 3.5 - 5.2 mmol/L   Chloride 102 96 - 106 mmol/L   CO2 18 (L) 20 - 29 mmol/L   Calcium 8.9 8.7 - 10.2 mg/dL   Total Protein 7.0 6.0 - 8.5 g/dL   Albumin 4.3 3.5 - 5.5 g/dL   Globulin, Total 2.7 1.5 - 4.5 g/dL   Albumin/Globulin Ratio 1.6 1.2 - 2.2   Bilirubin Total 1.3 (H) 0.0 - 1.2 mg/dL   Alkaline Phosphatase 70 39 - 117 IU/L   AST 15 0 - 40 IU/L   ALT 10 0 - 32 IU/L  TSH  Result Value Ref Range   TSH 1.670 0.450 - 4.500 uIU/mL      Assessment & Plan:   Problem List Items  Addressed This Visit      Other   Premenstrual dysphoric syndrome - Primary    Not under good control. Will restart prozac and recheck 1 month. Call with any concerns.       Relevant Medications   FLUoxetine (PROZAC) 20 MG capsule    Other Visit Diagnoses    Weight gain       Declines nutritionist. Will check on cost of medications. Checking labs. Work on diet and exercise. Call with any concerns.  Relevant Orders   CBC with Differential/Platelet   Comprehensive metabolic panel   Bayer DCA Hb A1c Waived   Thyroid Panel With TSH   Lymphadenopathy       Has been there a year and getting bigger. Will check CBC and obtain US. Await results.    Relevant Orders   US Soft Tissue Head/Neck   Arthralgia, unspecified joint       Possibly due to depression. Will start prozac and recheck 1 month. If no better, will check labs.    Plantar fasciitis       Will start exercises. Information given. If not getting better, will refer to podiatry.       Follow up plan: Return in about 1 month (around 05/16/2017).

## 2017-04-19 LAB — CBC WITH DIFFERENTIAL/PLATELET
Basophils Absolute: 0 10*3/uL (ref 0.0–0.2)
Basos: 0 %
EOS (ABSOLUTE): 0.2 10*3/uL (ref 0.0–0.4)
EOS: 3 %
HEMATOCRIT: 39.2 % (ref 34.0–46.6)
HEMOGLOBIN: 13.2 g/dL (ref 11.1–15.9)
Immature Grans (Abs): 0 10*3/uL (ref 0.0–0.1)
Immature Granulocytes: 0 %
LYMPHS ABS: 3 10*3/uL (ref 0.7–3.1)
Lymphs: 38 %
MCH: 28.3 pg (ref 26.6–33.0)
MCHC: 33.7 g/dL (ref 31.5–35.7)
MCV: 84 fL (ref 79–97)
MONOCYTES: 9 %
MONOS ABS: 0.7 10*3/uL (ref 0.1–0.9)
NEUTROS ABS: 4 10*3/uL (ref 1.4–7.0)
Neutrophils: 50 %
Platelets: 320 10*3/uL (ref 150–379)
RBC: 4.67 x10E6/uL (ref 3.77–5.28)
RDW: 13.2 % (ref 12.3–15.4)
WBC: 7.9 10*3/uL (ref 3.4–10.8)

## 2017-04-19 LAB — COMPREHENSIVE METABOLIC PANEL
A/G RATIO: 1.6 (ref 1.2–2.2)
ALBUMIN: 4.2 g/dL (ref 3.5–5.5)
ALK PHOS: 68 IU/L (ref 39–117)
ALT: 12 IU/L (ref 0–32)
AST: 15 IU/L (ref 0–40)
BILIRUBIN TOTAL: 0.6 mg/dL (ref 0.0–1.2)
BUN / CREAT RATIO: 12 (ref 9–23)
BUN: 11 mg/dL (ref 6–20)
CO2: 22 mmol/L (ref 20–29)
CREATININE: 0.91 mg/dL (ref 0.57–1.00)
Calcium: 9.2 mg/dL (ref 8.7–10.2)
Chloride: 104 mmol/L (ref 96–106)
GFR calc Af Amer: 101 mL/min/{1.73_m2} (ref >59)
GFR calc non Af Amer: 88 mL/min/{1.73_m2} (ref >59)
Globulin, Total: 2.6 g/dL (ref 1.5–4.5)
Glucose: 88 mg/dL (ref 65–99)
POTASSIUM: 4.4 mmol/L (ref 3.5–5.2)
SODIUM: 139 mmol/L (ref 134–144)
Total Protein: 6.8 g/dL (ref 6.0–8.5)

## 2017-04-19 LAB — THYROID PANEL WITH TSH
FREE THYROXINE INDEX: 1.8 (ref 1.2–4.9)
T3 Uptake Ratio: 26 % (ref 24–39)
T4, Total: 7.1 ug/dL (ref 4.5–12.0)
TSH: 1.45 u[IU]/mL (ref 0.450–4.500)

## 2017-04-21 ENCOUNTER — Encounter: Payer: Self-pay | Admitting: Family Medicine

## 2017-04-22 ENCOUNTER — Telehealth: Payer: Self-pay | Admitting: Family Medicine

## 2017-04-22 NOTE — Telephone Encounter (Signed)
Copied from [place]. Topic: Quick Communication - See Telephone Encounter >> Apr 22, 2017  5:27 PM Percell Belt A wrote: CRM for notification. See Telephone encounter for: 04/22/17. Umr called in and stated that no authorization was needed for the ultrasound.  Head and neck  [facility]  [phone]

## 2017-04-23 ENCOUNTER — Encounter: Payer: Self-pay | Admitting: Family Medicine

## 2017-04-24 ENCOUNTER — Ambulatory Visit: Payer: 59

## 2017-04-25 ENCOUNTER — Ambulatory Visit
Admission: RE | Admit: 2017-04-25 | Discharge: 2017-04-25 | Disposition: A | Discharge status: Home / Self Care | Payer: 59 | Source: Ambulatory Visit | Attending: Family Medicine | Admitting: Family Medicine

## 2017-04-25 DIAGNOSIS — R591 Generalized enlarged lymph nodes: Secondary | ICD-10-CM | POA: Diagnosis not present

## 2017-04-25 DIAGNOSIS — R590 Localized enlarged lymph nodes: Secondary | ICD-10-CM | POA: Diagnosis not present

## 2017-05-13 ENCOUNTER — Encounter: Payer: Self-pay | Admitting: Family Medicine

## 2017-05-13 MED ORDER — FLUOXETINE HCL 40 MG PO CAPS: 40.0000 mg | ORAL_CAPSULE | Freq: Every day | ORAL | 3 refills | Status: DC

## 2017-06-10 ENCOUNTER — Ambulatory Visit: Payer: 59 | Admitting: Obstetrics and Gynecology

## 2017-06-10 ENCOUNTER — Encounter: Payer: Self-pay | Admitting: Obstetrics and Gynecology

## 2017-06-10 ENCOUNTER — Other Ambulatory Visit (INDEPENDENT_AMBULATORY_CARE_PROVIDER_SITE_OTHER): Payer: 59

## 2017-06-10 VITALS — BP 124/82 | HR 63 | Ht 61.0 in | Wt 228.0 lb

## 2017-06-10 DIAGNOSIS — Z30431 Encounter for routine checking of intrauterine contraceptive device: Secondary | ICD-10-CM

## 2017-06-10 DIAGNOSIS — R102 Pelvic and perineal pain: Secondary | ICD-10-CM | POA: Diagnosis not present

## 2017-06-10 DIAGNOSIS — Z3169 Encounter for other general counseling and advice on procreation: Secondary | ICD-10-CM | POA: Diagnosis not present

## 2017-06-10 DIAGNOSIS — Z30432 Encounter for removal of intrauterine contraceptive device: Secondary | ICD-10-CM | POA: Diagnosis not present

## 2017-06-10 LAB — POCT URINALYSIS DIPSTICK
BILIRUBIN UA: NEGATIVE
GLUCOSE UA: NEGATIVE
Ketones, UA: NEGATIVE
Nitrite, UA: NEGATIVE
Protein, UA: NEGATIVE
RBC UA: NEGATIVE
Spec Grav, UA: 1.01 (ref 1.010–1.025)
pH, UA: 6 (ref 5.0–8.0)

## 2017-06-10 NOTE — Patient Instructions (Signed)
I value your feedback and entrusting us with your care. If you get a [facility] patient survey, I would appreciate you taking the time to let us know about your experience today. Thank you! 

## 2017-06-10 NOTE — Progress Notes (Signed)
[name], DO   Chief Complaint  Patient presents with  . Pelvic Pain    Sharp pain constantly gets worse with walking radiating to legs     HPI:      Ms. Breanna Lamb is a 27 y.o. [id number] who LMP was No LMP recorded. (Menstrual status: IUD)., presents today for pelvic pain. Pt was moving heavy boxes last wk and noted tolerable, intermittent, sharp pelvic pains afterwards and was going to f/u with [place] later this wk. She then developed severe pelvic pains this morning when walking to work. Pain took her breath away and caused her to vomit. Pain is constant, worse with walking and standing up. Radiating to low back bilat and down front of bilat legs. Feels like it's in her cervix. Has had spotting off and on the past 2 days. Has tried NSAIDs without relief. S/P mirena insertion 12/15 and is amenorrheic with it. No fevers, GI sx, vag sx, or urin sx except dysuria yesterday. Hx of ovar cysts in past.    Past Medical History:  Diagnosis Date  . Allergic rhinitis   . Benign positional vertigo   . Bunion   . Contraceptive management   . Depression   . Genital herpes   . Henoch-Schonlein purpura ([place])   . Lymphadenopathy   . Neck pain   . Obesity   . Ovarian cyst, right   . Poor concentration   . Premenstrual dysphoric syndrome   . Premenstrual dysphoric syndrome   . Screening for venereal disease   . Skin lesion     Past Surgical History:  Procedure Laterality Date  . BUNIONECTOMY  2010 and 2011  . CESAREAN SECTION    . mirena IUD  01/2014  . OVARIAN CYST REMOVAL  August 2014    Family History  Problem Relation Age of Onset  . Hypertension Mother   . Mental illness Mother        Depression, Anxiety  . Hypertension Father   . Hypertension Brother   . Hypertension Maternal Grandmother   . Diabetes Maternal Grandmother   . Hyperlipidemia Maternal Grandmother   . Heart disease Maternal Grandmother   . COPD Maternal Grandmother   . Kidney disease Maternal  Grandmother   . Mental illness Maternal Grandmother   . Hypertension Sister   . Mental illness Sister   . Heart disease Maternal Grandfather   . Heart attack Maternal Grandfather   . Cancer Paternal Grandmother     Social History   Socioeconomic History  . Marital status: Married    Spouse name: Not on file  . Number of children: Not on file  . Years of education: Not on file  . Highest education level: Not on file  Occupational History  . Not on file  Social Needs  . Financial resource strain: Not on file  . Food insecurity:    Worry: Not on file    Inability: Not on file  . Transportation needs:    Medical: Not on file    Non-medical: Not on file  Tobacco Use  . Smoking status: Never Smoker  . Smokeless tobacco: Never Used  Substance and Sexual Activity  . Alcohol use: No  . Drug use: No  . Sexual activity: Yes    Birth control/protection: IUD    Comment: Mirena   Lifestyle  . Physical activity:    Days per week: Not on file    Minutes per session: Not on file  . Stress:  Not on file  Relationships  . Social connections:    Talks on phone: Not on file    Gets together: Not on file    Attends religious service: Not on file    Active member of club or organization: Not on file    Attends meetings of clubs or organizations: Not on file    Relationship status: Not on file  . Intimate partner violence:    Fear of current or ex partner: Not on file    Emotionally abused: Not on file    Physically abused: Not on file    Forced sexual activity: Not on file  Other Topics Concern  . Not on file  Social History Narrative  . Not on file    Outpatient Medications Prior to Visit  Medication Sig Dispense Refill  . FLUoxetine (PROZAC) 40 MG capsule Take 1 capsule (40 mg total) by mouth daily. 30 capsule 3  . levonorgestrel (MIRENA) 20 MCG/24HR IUD 1 each by Intrauterine route once.     No facility-administered medications prior to visit.     ROS:  Review of  Systems  Constitutional: Positive for fatigue. Negative for fever and unexpected weight change.  Respiratory: Negative for cough, shortness of breath and wheezing.   Cardiovascular: Negative for chest pain, palpitations and leg swelling.  Gastrointestinal: Positive for vomiting. Negative for blood in stool, constipation, diarrhea and nausea.  Endocrine: Negative for cold intolerance, heat intolerance and polyuria.  Genitourinary: Positive for dyspareunia, vaginal bleeding and vaginal discharge. Negative for dysuria, flank pain, frequency, genital sores, hematuria, menstrual problem, pelvic pain, urgency and vaginal pain.  Musculoskeletal: Positive for back pain. Negative for joint swelling and myalgias.  Skin: Negative for rash.  Neurological: Negative for dizziness, syncope, light-headedness, numbness and headaches.  Hematological: Negative for adenopathy.  Psychiatric/Behavioral: Negative for agitation, confusion, sleep disturbance and suicidal ideas. The patient is not nervous/anxious.     OBJECTIVE:   Vitals:  BP 124/82   Pulse 63   Ht 5\' 1"  (1.549 m)   Wt 228 lb (103.4 kg)   BMI 43.08 kg/m   Physical Exam  Constitutional: She is oriented to person, place, and time. Vital signs are normal. She appears well-developed.  Pulmonary/Chest: Effort normal.  Abdominal: Soft. Normal appearance.  Genitourinary: Vagina normal and uterus normal. There is no rash, tenderness or lesion on the right labia. There is no rash, tenderness or lesion on the left labia. Uterus is not enlarged and not tender. Cervix exhibits no motion tenderness. Right adnexum displays no mass and no tenderness. Left adnexum displays no mass and no tenderness. No erythema or tenderness in the vagina. No vaginal discharge found.  Musculoskeletal: Normal range of motion.  Neurological: She is alert and oriented to person, place, and time.  Psychiatric: She has a normal mood and affect. Her behavior is normal. Thought  content normal.  Vitals reviewed.   Results: Results for orders placed or performed in visit on 06/10/17 (from the past 24 hour(s))  POCT Urinalysis Dipstick     Status: Abnormal   Collection Time: 06/10/17 11:55 AM  Result Value Ref Range   Color, UA straw    Clarity, UA clear    Glucose, UA Negative Negative   Bilirubin, UA neg    Ketones, UA neg    Spec Grav, UA 1.010 1.010 - 1.025   Blood, UA neg    pH, UA 6.0 5.0 - 8.0   Protein, UA Negative Negative   Urobilinogen, UA  0.2 or  1.0 E.U./dL   Nitrite, UA neg    Leukocytes, UA Small (1+) (A) Negative   Appearance     Odor     ULTRASOUND REPORT  Patient Name: EMILENE ROMA DOB: [birth year] MRN: [id number]  Location: [facility]  Date of Service: 06/10/2017    Indications:Pelvic Pain Findings:  The uterus is anteverted and measures 8.44 x 3.90 x 5.91cm. Echo texture is homogenous without evidence of focal masses.  The Endometrium measures 4.0 mm.  IUD is in the correct location in the fundus of the uterus  Right Ovary measures 2.93 x 1.92 x 1.85 cm. It is normal in appearance. Left Ovary measures 2.96 x 2.62 x 1.94 cm. It is normal in appearance. Survey of the adnexa demonstrates no adnexal masses. There is no free fluid in the cul de sac.  Impression: 1. IUD is in the correct location   Recommendations: 1.Clinical correlation with the patient's History and Physical Exam.  Edwena Bunde, RDMS, RVT    IUD Removal Strings of IUD identified and grasped.  IUD removed without problem with [place] forceps.  Pt tolerated this well.  IUD noted to be intact.   Assessment/Plan: Pelvic pain - Tender on exam but neg GYN u/s. Question MSK after lifting. After u/s f/u, pt decided for IUD removal due to conception in near future. NSAIDs. F/u if sx persis - Plan: POCT Urinalysis Dipstick, US PELVIS TRANSVANGINAL NON-OB (TV ONLY), Urine Culture  Encounter for routine checking of intrauterine  contraceptive device (IUD) - IUD in correct location on u/s.  - Plan: US PELVIS TRANSVANGINAL NON-OB (TV ONLY)  Encounter for IUD removal  Pre-conception counseling - Start PNVs. F/u prn.    Return if symptoms worsen or fail to improve.  Alicia B. Copland, PA-C 06/10/2017 3:08 PM

## 2017-06-12 LAB — URINE CULTURE

## 2017-06-13 ENCOUNTER — Ambulatory Visit: Payer: 59 | Admitting: Obstetrics and Gynecology

## 2017-06-17 ENCOUNTER — Encounter: Payer: Self-pay | Admitting: Family Medicine

## 2017-06-17 ENCOUNTER — Ambulatory Visit (INDEPENDENT_AMBULATORY_CARE_PROVIDER_SITE_OTHER): Payer: 59 | Admitting: Family Medicine

## 2017-06-17 VITALS — BP 117/76 | HR 85 | Temp 98.3°F | Ht 61.6 in | Wt 227.4 lb

## 2017-06-17 DIAGNOSIS — Z0000 Encounter for general adult medical examination without abnormal findings: Secondary | ICD-10-CM

## 2017-06-17 DIAGNOSIS — L259 Unspecified contact dermatitis, unspecified cause: Secondary | ICD-10-CM | POA: Diagnosis not present

## 2017-06-17 DIAGNOSIS — F3289 Other specified depressive episodes: Secondary | ICD-10-CM

## 2017-06-17 DIAGNOSIS — Z30016 Encounter for initial prescription of transdermal patch hormonal contraceptive device: Secondary | ICD-10-CM | POA: Diagnosis not present

## 2017-06-17 DIAGNOSIS — Z13220 Encounter for screening for lipoid disorders: Secondary | ICD-10-CM | POA: Diagnosis not present

## 2017-06-17 DIAGNOSIS — D485 Neoplasm of uncertain behavior of skin: Secondary | ICD-10-CM

## 2017-06-17 DIAGNOSIS — F3281 Premenstrual dysphoric disorder: Secondary | ICD-10-CM

## 2017-06-17 MED ORDER — NORELGESTROMIN-ETH ESTRADIOL 150-35 MCG/24HR TD PTWK: 1.0000 | MEDICATED_PATCH | TRANSDERMAL | 12 refills | Status: DC

## 2017-06-17 MED ORDER — FLUOXETINE HCL 60 MG PO TABS: 60.0000 mg | ORAL_TABLET | Freq: Every day | ORAL | 3 refills | Status: DC

## 2017-06-17 MED ORDER — TRIAMCINOLONE ACETONIDE 0.5 % EX OINT: 1.0000 "application " | TOPICAL_OINTMENT | Freq: Two times a day (BID) | CUTANEOUS | 0 refills | Status: DC

## 2017-06-17 NOTE — Patient Instructions (Addendum)
Health Maintenance, Female Adopting a healthy lifestyle and getting preventive care can go a long way to promote health and wellness. Talk with your health care provider about what schedule of regular examinations is right for you. This is a good chance for you to check in with your provider about disease prevention and staying healthy. In between checkups, there are plenty of things you can do on your own. Experts have done a lot of research about which lifestyle changes and preventive measures are most likely to keep you healthy. Ask your health care provider for more information. Weight and diet Eat a healthy diet  Be sure to include plenty of vegetables, fruits, low-fat dairy products, and lean protein.  Do not eat a lot of foods high in solid fats, added sugars, or salt.  Get regular exercise. This is one of the most important things you can do for your health. ? Most adults should exercise for at least 150 minutes each week. The exercise should increase your heart rate and make you sweat (moderate-intensity exercise). ? Most adults should also do strengthening exercises at least twice a week. This is in addition to the moderate-intensity exercise.  Maintain a healthy weight  Body mass index (BMI) is a measurement that can be used to identify possible weight problems. It estimates body fat based on height and weight. Your health care provider can help determine your BMI and help you achieve or maintain a healthy weight.  For females 20 years of age and older: ? A BMI below 18.5 is considered underweight. ? A BMI of 18.5 to 24.9 is normal. ? A BMI of 25 to 29.9 is considered overweight. ? A BMI of 30 and above is considered obese.  Watch levels of cholesterol and blood lipids  You should start having your blood tested for lipids and cholesterol at 26 years of age, then have this test every 5 years.  You may need to have your cholesterol levels checked more often if: ? Your lipid or  cholesterol levels are high. ? You are older than 26 years of age. ? You are at high risk for heart disease.  Cancer screening Lung Cancer  Lung cancer screening is recommended for adults 55-80 years old who are at high risk for lung cancer because of a history of smoking.  A yearly low-dose CT scan of the lungs is recommended for people who: ? Currently smoke. ? Have quit within the past 15 years. ? Have at least a 30-pack-year history of smoking. A pack year is smoking an average of one pack of cigarettes a day for 1 year.  Yearly screening should continue until it has been 15 years since you quit.  Yearly screening should stop if you develop a health problem that would prevent you from having lung cancer treatment.  Breast Cancer  Practice breast self-awareness. This means understanding how your breasts normally appear and feel.  It also means doing regular breast self-exams. Let your health care provider know about any changes, no matter how small.  If you are in your 20s or 30s, you should have a clinical breast exam (CBE) by a health care provider every 1-3 years as part of a regular health exam.  If you are 40 or older, have a CBE every year. Also consider having a breast X-ray (mammogram) every year.  If you have a family history of breast cancer, talk to your health care provider about genetic screening.  If you are at high risk   for breast cancer, talk to your health care provider about having an MRI and a mammogram every year.  Breast cancer gene (BRCA) assessment is recommended for women who have family members with BRCA-related cancers. BRCA-related cancers include: ? Breast. ? Ovarian. ? Tubal. ? Peritoneal cancers.  Results of the assessment will determine the need for genetic counseling and BRCA1 and BRCA2 testing.  Cervical Cancer Your health care provider may recommend that you be screened regularly for cancer of the pelvic organs (ovaries, uterus, and  vagina). This screening involves a pelvic examination, including checking for microscopic changes to the surface of your cervix (Pap test). You may be encouraged to have this screening done every 3 years, beginning at age 22.  For women ages 56-65, health care providers may recommend pelvic exams and Pap testing every 3 years, or they may recommend the Pap and pelvic exam, combined with testing for human papilloma virus (HPV), every 5 years. Some types of HPV increase your risk of cervical cancer. Testing for HPV may also be done on women of any age with unclear Pap test results.  Other health care providers may not recommend any screening for nonpregnant women who are considered low risk for pelvic cancer and who do not have symptoms. Ask your health care provider if a screening pelvic exam is right for you.  If you have had past treatment for cervical cancer or a condition that could lead to cancer, you need Pap tests and screening for cancer for at least 20 years after your treatment. If Pap tests have been discontinued, your risk factors (such as having a new sexual partner) need to be reassessed to determine if screening should resume. Some women have medical problems that increase the chance of getting cervical cancer. In these cases, your health care provider may recommend more frequent screening and Pap tests.  Colorectal Cancer  This type of cancer can be detected and often prevented.  Routine colorectal cancer screening usually begins at 26 years of age and continues through 26 years of age.  Your health care provider may recommend screening at an earlier age if you have risk factors for colon cancer.  Your health care provider may also recommend using home test kits to check for hidden blood in the stool.  A small camera at the end of a tube can be used to examine your colon directly (sigmoidoscopy or colonoscopy). This is done to check for the earliest forms of colorectal  cancer.  Routine screening usually begins at age 33.  Direct examination of the colon should be repeated every 5-10 years through 26 years of age. However, you may need to be screened more often if early forms of precancerous polyps or small growths are found.  Skin Cancer  Check your skin from head to toe regularly.  Tell your health care provider about any new moles or changes in moles, especially if there is a change in a mole's shape or color.  Also tell your health care provider if you have a mole that is larger than the size of a pencil eraser.  Always use sunscreen. Apply sunscreen liberally and repeatedly throughout the day.  Protect yourself by wearing long sleeves, pants, a wide-brimmed hat, and sunglasses whenever you are outside.  Heart disease, diabetes, and high blood pressure  High blood pressure causes heart disease and increases the risk of stroke. High blood pressure is more likely to develop in: ? People who have blood pressure in the high end of  the normal range (130-139/85-89 mm Hg). ? People who are overweight or obese. ? People who are African American.  If you are 21-29 years of age, have your blood pressure checked every 3-5 years. If you are 3 years of age or older, have your blood pressure checked every year. You should have your blood pressure measured twice-once when you are at a hospital or clinic, and once when you are not at a hospital or clinic. Record the average of the two measurements. To check your blood pressure when you are not at a hospital or clinic, you can use: ? An automated blood pressure machine at a pharmacy. ? A home blood pressure monitor.  If you are between 17 years and 37 years old, ask your health care provider if you should take aspirin to prevent strokes.  Have regular diabetes screenings. This involves taking a blood sample to check your fasting blood sugar level. ? If you are at a normal weight and have a low risk for diabetes,  have this test once every three years after 26 years of age. ? If you are overweight and have a high risk for diabetes, consider being tested at a younger age or more often. Preventing infection Hepatitis B  If you have a higher risk for hepatitis B, you should be screened for this virus. You are considered at high risk for hepatitis B if: ? You were born in a country where hepatitis B is common. Ask your health care provider which countries are considered high risk. ? Your parents were born in a high-risk country, and you have not been immunized against hepatitis B (hepatitis B vaccine). ? You have HIV or AIDS. ? You use needles to inject street drugs. ? You live with someone who has hepatitis B. ? You have had sex with someone who has hepatitis B. ? You get hemodialysis treatment. ? You take certain medicines for conditions, including cancer, organ transplantation, and autoimmune conditions.  Hepatitis C  Blood testing is recommended for: ? Everyone born from [birth year] through 1964. ? Anyone with known risk factors for hepatitis C.  Sexually transmitted infections (STIs)  You should be screened for sexually transmitted infections (STIs) including gonorrhea and chlamydia if: ? You are sexually active and are younger than 26 years of age. ? You are older than 26 years of age and your health care provider tells you that you are at risk for this type of infection. ? Your sexual activity has changed since you were last screened and you are at an increased risk for chlamydia or gonorrhea. Ask your health care provider if you are at risk.  If you do not have HIV, but are at risk, it may be recommended that you take a prescription medicine daily to prevent HIV infection. This is called pre-exposure prophylaxis (PrEP). You are considered at risk if: ? You are sexually active and do not regularly use condoms or know the HIV status of your partner(s). ? You take drugs by injection. ? You are  sexually active with a partner who has HIV.  Talk with your health care provider about whether you are at high risk of being infected with HIV. If you choose to begin PrEP, you should first be tested for HIV. You should then be tested every 3 months for as long as you are taking PrEP. Pregnancy  If you are premenopausal and you may become pregnant, ask your health care provider about preconception counseling.  If you may become  pregnant, take 400 to 800 micrograms (mcg) of folic acid every day.  If you want to prevent pregnancy, talk to your health care provider about birth control (contraception). Osteoporosis and menopause  Osteoporosis is a disease in which the bones lose minerals and strength with aging. This can result in serious bone fractures. Your risk for osteoporosis can be identified using a bone density scan.  If you are 26 years of age or older, or if you are at risk for osteoporosis and fractures, ask your health care provider if you should be screened.  Ask your health care provider whether you should take a calcium or vitamin D supplement to lower your risk for osteoporosis.  Menopause may have certain physical symptoms and risks.  Hormone replacement therapy may reduce some of these symptoms and risks. Talk to your health care provider about whether hormone replacement therapy is right for you. Follow these instructions at home:  Schedule regular health, dental, and eye exams.  Stay current with your immunizations.  Do not use any tobacco products including cigarettes, chewing tobacco, or electronic cigarettes.  If you are pregnant, do not drink alcohol.  If you are breastfeeding, limit how much and how often you drink alcohol.  Limit alcohol intake to no more than 1 drink per day for nonpregnant women. One drink equals 12 ounces of beer, 5 ounces of wine, or 1 ounces of hard liquor.  Do not use street drugs.  Do not share needles.  Ask your health care  provider for help if you need support or information about quitting drugs.  Tell your health care provider if you often feel depressed.  Tell your health care provider if you have ever been abused or do not feel safe at home. This information is not intended to replace advice given to you by your health care provider. Make sure you discuss any questions you have with your health care provider. Document Released: 07/09/2010 Document Revised: 06/01/2015 Document Reviewed: 09/27/2014 Elsevier Interactive Patient Education  2018 [employer].  Contraception Choices Contraception, also called birth control, means things to use or ways to try not to get pregnant. Hormonal birth control This kind of birth control uses hormones. Here are some types of hormonal birth control:  A tube that is put under skin of the arm (implant). The tube can stay in for as long as 3 years.  Shots to get every 3 months (injections).  Pills to take every day (birth control pills).  A patch to change 1 time each week for 3 weeks (birth control patch). After that, the patch is taken off for 1 week.  A ring to put in the vagina. The ring is left in for 3 weeks. Then it is taken out of the vagina for 1 week. Then a new ring is put in.  Pills to take after unprotected sex (emergency birth control pills).  Barrier birth control Here are some types of barrier birth control:  A thin covering that is put on the penis before sex (female condom). The covering is thrown away after sex.  A soft, loose covering that is put in the vagina before sex (female condom). The covering is thrown away after sex.  A rubber bowl that sits over the cervix (diaphragm). The bowl must be made for you. The bowl is put into the vagina before sex. The bowl is left in for 6-8 hours after sex. It is taken out within 24 hours.  A small, soft cup that fits  over the cervix (cervical cap). The cup must be made for you. The cup can be left in for 6-8  hours after sex. It is taken out within 48 hours.  A sponge that is put into the vagina before sex. It must be left in for at least 6 hours after sex. It must be taken out within 30 hours. Then it is thrown away.  A chemical that kills or stops sperm from getting into the uterus (spermicide). It may be a pill, cream, jelly, or foam to put in the vagina. The chemical should be used at least 10-15 minutes before sex.  IUD (intrauterine) birth control An IUD is a small, T-shaped piece of plastic. It is put inside the uterus. There are two kinds:  Hormone IUD. This kind can stay in for 3-5 years.  Copper IUD. This kind can stay in for 10 years.  Permanent birth control Here are some types of permanent birth control:  Surgery to block the fallopian tubes.  Having an insert put into each fallopian tube.  Surgery to tie off the tubes that carry sperm (vasectomy).  Natural planning birth control Here are some types of natural planning birth control:  Not having sex on the days the woman could get pregnant.  Using a calendar: ? To keep track of the length of each period. ? To find out what days pregnancy can happen. ? To plan to not have sex on days when pregnancy can happen.  Watching for symptoms of ovulation and not having sex during ovulation. One way the woman can check for ovulation is to check her temperature.  Waiting to have sex until after ovulation.  Summary  Contraception, also called birth control, means things to use or ways to try not to get pregnant.  Hormonal methods of birth control include implants, injections, pills, patches, vaginal rings, and emergency birth control pills.  Barrier methods of birth control can include female condoms, female condoms, diaphragms, cervical caps, sponges, and spermicides.  There are two types of IUD (intrauterine device) birth control. An IUD can be put in a woman's uterus to prevent pregnancy for 3-5 years.  Permanent  sterilization can be done through a procedure for males, females, or both.  Natural planning methods involve not having sex on the days when the woman could get pregnant. This information is not intended to replace advice given to you by your health care provider. Make sure you discuss any questions you have with your health care provider. Document Released: 10/21/2008 Document Revised: 01/04/2016 Document Reviewed: 01/04/2016 Elsevier Interactive Patient Education  2017 [place].  Plantar Fasciitis Rehab Ask your health care provider which exercises are safe for you. Do exercises exactly as told by your health care provider and adjust them as directed. It is normal to feel mild stretching, pulling, tightness, or discomfort as you do these exercises, but you should stop right away if you feel sudden pain or your pain gets worse. Do not begin these exercises until told by your health care provider. Stretching and range of motion exercises These exercises warm up your muscles and joints and improve the movement and flexibility of your foot. These exercises also help to relieve pain. Exercise A: Plantar fascia stretch  1. Sit with your left / right leg crossed over your opposite knee. 2. Hold your heel with one hand with that thumb near your arch. With your other hand, hold your toes and gently pull them back toward the top of your foot.  You should feel a stretch on the bottom of your toes or your foot or both. 3. Hold this stretch for__________ seconds. 4. Slowly release your toes and return to the starting position. Repeat __________ times. Complete this exercise __________ times a day. Exercise B: Gastroc, standing  1. Stand with your hands against a wall. 2. Extend your left / right leg behind you, and bend your front knee slightly. 3. Keeping your heels on the floor and keeping your back knee straight, shift your weight toward the wall without arching your back. You should feel a gentle  stretch in your left / right calf. 4. Hold this position for __________ seconds. Repeat __________ times. Complete this exercise __________ times a day. Exercise C: Soleus, standing 1. Stand with your hands against a wall. 2. Extend your left / right leg behind you, and bend your front knee slightly. 3. Keeping your heels on the floor, bend your back knee and slightly shift your weight over the back leg. You should feel a gentle stretch deep in your calf. 4. Hold this position for __________ seconds. Repeat __________ times. Complete this exercise __________ times a day. Exercise D: Gastrocsoleus, standing 1. Stand with the ball of your left / right foot on a step. The ball of your foot is on the walking surface, right under your toes. 2. Keep your other foot firmly on the same step. 3. Hold onto the wall or a railing for balance. 4. Slowly lift your other foot, allowing your body weight to press your heel down over the edge of the step. You should feel a stretch in your left / right calf. 5. Hold this position for __________ seconds. 6. Return both feet to the step. 7. Repeat this exercise with a slight bend in your left / right knee. Repeat __________ times with your left / right knee straight and __________ times with your left / right knee bent. Complete this exercise __________ times a day. Balance exercise This exercise builds your balance and strength control of your arch to help take pressure off your plantar fascia. Exercise E: Single leg stand 1. Without shoes, stand near a railing or in a doorway. You may hold onto the railing or door frame as needed. 2. Stand on your left / right foot. Keep your big toe down on the floor and try to keep your arch lifted. Do not let your foot roll inward. 3. Hold this position for __________ seconds. 4. If this exercise is too easy, you can try it with your eyes closed or while standing on a pillow. Repeat __________ times. Complete this exercise  __________ times a day. This information is not intended to replace advice given to you by your health care provider. Make sure you discuss any questions you have with your health care provider. Document Released: 12/24/2004 Document Revised: 08/29/2015 Document Reviewed: 11/07/2014 Elsevier Interactive Patient Education  2018 [employer].

## 2017-06-17 NOTE — Assessment & Plan Note (Signed)
Doing better, but not quite there. Will increase to 60mg  and recheck in 1-3 months. Call with any concerns.

## 2017-06-17 NOTE — Progress Notes (Signed)
BP 117/76 (BP Location: Left Arm, Patient Position: Sitting, Cuff Size: Large)   Pulse 85   Temp 98.3 F (36.8 C)   Ht 5' 1.6" (1.565 m)   Wt 227 lb 6 oz (103.1 kg)   LMP  (LMP Unknown)   SpO2 96%   BMI 42.13 kg/m    Subjective:    Patient ID: Breanna Lamb, female    DOB: Jun 12, 1990, 27 y.o.   MRN: [id number]  HPI: Breanna Lamb is a 27 y.o. female presenting on 06/17/2017 for comprehensive medical examination. Current medical complaints include:  PMDD Mood status: better Satisfied with current treatment?: no Symptom severity: mild  Duration of current treatment : months Side effects: no Medication compliance: excellent compliance Psychotherapy/counseling: no  Previous psychiatric medications: prozac Depressed mood: yes Anxious mood: yes Anhedonia: no Significant weight loss or gain: no Insomnia: no  Fatigue: yes Feelings of worthlessness or guilt: no Impaired concentration/indecisiveness: no Suicidal ideations: no Hopelessness: no Crying spells: no Depression screen [place] 2/9 06/17/2017 04/18/2017 01/31/2016  Decreased Interest 1 2 1   Down, Depressed, Hopeless 1 2 2   PHQ - 2 Score 2 4 3   Altered sleeping 1 1 3   Tired, decreased energy 2 2 2   Change in appetite 3 3 3   Feeling bad or failure about yourself  1 2 1   Trouble concentrating 1 3 2   Moving slowly or fidgety/restless 1 1 0  Suicidal thoughts 0 0 0  PHQ-9 Score 11 16 14   Difficult doing work/chores Somewhat difficult - -   She currently lives with: husband and kids Menopausal Symptoms: no  Depression Screen done today and results listed below:  Depression screen [facility] 2/9 06/17/2017 04/18/2017 01/31/2016  Decreased Interest 1 2 1   Down, Depressed, Hopeless 1 2 2   PHQ - 2 Score 2 4 3   Altered sleeping 1 1 3   Tired, decreased energy 2 2 2   Change in appetite 3 3 3   Feeling bad or failure about yourself  1 2 1   Trouble concentrating 1 3 2   Moving slowly or fidgety/restless 1 1 0  Suicidal  thoughts 0 0 0  PHQ-9 Score 11 16 14   Difficult doing work/chores Somewhat difficult - -    Past Medical History:  Past Medical History:  Diagnosis Date  . Allergic rhinitis   . Benign positional vertigo   . Bunion   . Contraceptive management   . Depression   . Genital herpes   . Henoch-Schonlein purpura ([place])   . Lymphadenopathy   . Neck pain   . Obesity   . Ovarian cyst, right   . Poor concentration   . Premenstrual dysphoric syndrome   . Premenstrual dysphoric syndrome   . Screening for venereal disease   . Skin lesion     Surgical History:  Past Surgical History:  Procedure Laterality Date  . BUNIONECTOMY  2010 and 2011  . CESAREAN SECTION    . mirena IUD  01/2014  . OVARIAN CYST REMOVAL  August 2014    Medications:  Current Outpatient Medications on File Prior to Visit  Medication Sig  . FLUoxetine (PROZAC) 40 MG capsule Take 1 capsule (40 mg total) by mouth daily.   No current facility-administered medications on file prior to visit.     Allergies:  Allergies  Allergen Reactions  . Nickel     Social History:  Social History   Socioeconomic History  . Marital status: Married    Spouse name: Not on file  . Number  of children: Not on file  . Years of education: Not on file  . Highest education level: Not on file  Occupational History  . Not on file  Social Needs  . Financial resource strain: Not on file  . Food insecurity:    Worry: Not on file    Inability: Not on file  . Transportation needs:    Medical: Not on file    Non-medical: Not on file  Tobacco Use  . Smoking status: Never Smoker  . Smokeless tobacco: Never Used  Substance and Sexual Activity  . Alcohol use: No  . Drug use: No  . Sexual activity: Yes    Birth control/protection: None    Comment: Mirena   Lifestyle  . Physical activity:    Days per week: Not on file    Minutes per session: Not on file  . Stress: Not on file  Relationships  . Social connections:    Talks  on phone: Not on file    Gets together: Not on file    Attends religious service: Not on file    Active member of club or organization: Not on file    Attends meetings of clubs or organizations: Not on file    Relationship status: Not on file  . Intimate partner violence:    Fear of current or ex partner: Not on file    Emotionally abused: Not on file    Physically abused: Not on file    Forced sexual activity: Not on file  Other Topics Concern  . Not on file  Social History Narrative  . Not on file   Social History   Tobacco Use  Smoking Status Never Smoker  Smokeless Tobacco Never Used   Social History   Substance and Sexual Activity  Alcohol Use No    Family History:  Family History  Problem Relation Age of Onset  . Hypertension Mother   . Mental illness Mother        Depression, Anxiety  . Hypertension Father   . Hypertension Brother   . Hypertension Maternal Grandmother   . Diabetes Maternal Grandmother   . Hyperlipidemia Maternal Grandmother   . Heart disease Maternal Grandmother   . COPD Maternal Grandmother   . Kidney disease Maternal Grandmother   . Mental illness Maternal Grandmother   . Hypertension Sister   . Mental illness Sister   . Heart disease Maternal Grandfather   . Heart attack Maternal Grandfather   . Cancer Paternal Grandmother     Past medical history, surgical history, medications, allergies, family history and social history reviewed with patient today and changes made to appropriate areas of the chart.   Review of Systems  Constitutional: Positive for diaphoresis. Negative for chills, fever, malaise/fatigue and weight loss.  HENT: Negative.   Eyes: Negative.   Respiratory: Negative.   Cardiovascular: Negative.   Gastrointestinal: Negative.   Genitourinary: Negative.   Musculoskeletal: Negative.   Skin: Positive for rash. Negative for itching.       Mole on her back   Neurological: Negative.   Endo/Heme/Allergies: Negative.     Psychiatric/Behavioral: Positive for depression. Negative for hallucinations, memory loss, substance abuse and suicidal ideas. The patient is not nervous/anxious and does not have insomnia.     All other ROS negative except what is listed above and in the HPI.      Objective:    BP 117/76 (BP Location: Left Arm, Patient Position: Sitting, Cuff Size: Large)   Pulse 85  Temp 98.3 F (36.8 C)   Ht 5' 1.6" (1.565 m)   Wt 227 lb 6 oz (103.1 kg)   LMP  (LMP Unknown)   SpO2 96%   BMI 42.13 kg/m   Wt Readings from Last 3 Encounters:  06/17/17 227 lb 6 oz (103.1 kg)  06/10/17 228 lb (103.4 kg)  04/18/17 223 lb 4.8 oz (101.3 kg)    Physical Exam  Constitutional: She is oriented to person, place, and time. She appears well-developed and well-nourished. No distress.  HENT:  Head: Normocephalic and atraumatic.  Right Ear: Hearing and external ear normal.  Left Ear: Hearing and external ear normal.  Nose: Nose normal.  Mouth/Throat: Oropharynx is clear and moist. No oropharyngeal exudate.  Eyes: Pupils are equal, round, and reactive to light. Conjunctivae, EOM and lids are normal. Right eye exhibits no discharge. Left eye exhibits no discharge. No scleral icterus.  Neck: Normal range of motion. Neck supple. No JVD present. No tracheal deviation present. No thyromegaly present.  Cardiovascular: Normal rate, regular rhythm, normal heart sounds and intact distal pulses. Exam reveals no gallop and no friction rub.  No murmur heard. Pulmonary/Chest: Effort normal and breath sounds normal. No stridor. No respiratory distress. She has no wheezes. She has no rales. She exhibits no tenderness.  Abdominal: Soft. Bowel sounds are normal. She exhibits no distension and no mass. There is no tenderness. There is no rebound and no guarding. No hernia.  Genitourinary:  Genitourinary Comments: Breast and pelvic exams deferred- done at GYN  Musculoskeletal: Normal range of motion. She exhibits no edema,  tenderness or deformity.  Lymphadenopathy:    She has no cervical adenopathy.  Neurological: She is alert and oriented to person, place, and time. She displays normal reflexes. No cranial nerve deficit or sensory deficit. She exhibits normal muscle tone. Coordination normal.  Skin: Skin is warm, dry and intact. Capillary refill takes less than 2 seconds. No rash noted. She is not diaphoretic. No erythema. No pallor.  0.3cm flesh colored raised area with area of hyperpigmentation in the middle on R scapula  Psychiatric: She has a normal mood and affect. Her speech is normal and behavior is normal. Judgment and thought content normal. Cognition and memory are normal.  Nursing note and vitals reviewed.   Results for orders placed or performed in visit on 06/10/17  Urine Culture  Result Value Ref Range   Urine Culture, Routine Final report    Organism ID, Bacteria Comment   POCT Urinalysis Dipstick  Result Value Ref Range   Color, UA straw    Clarity, UA clear    Glucose, UA Negative Negative   Bilirubin, UA neg    Ketones, UA neg    Spec Grav, UA 1.010 1.010 - 1.025   Blood, UA neg    pH, UA 6.0 5.0 - 8.0   Protein, UA Negative Negative   Urobilinogen, UA  0.2 or 1.0 E.U./dL   Nitrite, UA neg    Leukocytes, UA Small (1+) (A) Negative   Appearance     Odor        Assessment & Plan:   Problem List Items Addressed This Visit      Other   Premenstrual dysphoric syndrome    Doing better, but not quite there. Will increase to 60mg  and recheck in 1-3 months. Call with any concerns.       Relevant Medications   FLUoxetine HCl 60 MG TABS    Other Visit Diagnoses    Routine  general medical examination at a health care facility    -  Primary   Vaccines up to date. Pap done at GYN. Labs drawn today. Continue diet and exercise. Call with any concerns.    Screening for cholesterol level       Labs drawn today. Await results.    Relevant Orders   Lipid Panel w/o Chol/HDL Ratio    Neoplasm of uncertain behavior of skin       Will return for removal.   Encounter for initial prescription of transdermal patch hormonal contraceptive device       Would like to try patch. Rx sent to her pharmacy. Call with any concerns.    Contact dermatitis, unspecified contact dermatitis type, unspecified trigger       Will treat with triamcinalone ointment. Call with any concerns.        Follow up plan: Return in about 3 months (around 09/17/2017) for mole removal and follow up mood/OCP.   LABORATORY TESTING:  - Pap smear: up to date- done at GYN  IMMUNIZATIONS:   - Tdap: Tetanus vaccination status reviewed: last tetanus booster within 10 years. - Influenza: Postponed to flu season - Pneumovax: Not applicable   PATIENT COUNSELING:   Advised to take 1 mg of folate supplement per day if capable of pregnancy.   Sexuality: Discussed sexually transmitted diseases, partner selection, use of condoms, avoidance of unintended pregnancy  and contraceptive alternatives.   Advised to avoid cigarette smoking.  I discussed with the patient that most people either abstain from alcohol or drink within safe limits (<=14/week and <=4 drinks/occasion for males, <=7/weeks and <= 3 drinks/occasion for females) and that the risk for alcohol disorders and other health effects rises proportionally with the number of drinks per week and how often a drinker exceeds daily limits.  Discussed cessation/primary prevention of drug use and availability of treatment for abuse.   Diet: Encouraged to adjust caloric intake to maintain  or achieve ideal body weight, to reduce intake of dietary saturated fat and total fat, to limit sodium intake by avoiding high sodium foods and not adding table salt, and to maintain adequate dietary potassium and calcium preferably from fresh fruits, vegetables, and low-fat dairy products.    stressed the importance of regular exercise  Injury prevention: Discussed safety belts,  safety helmets, smoke detector, smoking near bedding or upholstery.   Dental health: Discussed importance of regular tooth brushing, flossing, and dental visits.    NEXT PREVENTATIVE PHYSICAL DUE IN 1 YEAR. Return in about 3 months (around 09/17/2017) for mole removal and follow up mood/OCP.

## 2017-06-18 LAB — LIPID PANEL W/O CHOL/HDL RATIO
Cholesterol, Total: 205 mg/dL — ABNORMAL HIGH (ref 100–199)
HDL: 47 mg/dL (ref >39)
LDL CALC: 130 mg/dL — AB (ref 0–99)
Triglycerides: 141 mg/dL (ref 0–149)
VLDL Cholesterol Cal: 28 mg/dL (ref 5–40)

## 2017-06-20 ENCOUNTER — Ambulatory Visit: Payer: 59 | Admitting: Family Medicine

## 2017-09-26 ENCOUNTER — Ambulatory Visit: Payer: 59 | Admitting: Family Medicine

## 2017-11-11 ENCOUNTER — Other Ambulatory Visit (HOSPITAL_COMMUNITY)
Admission: RE | Admit: 2017-11-11 | Discharge: 2017-11-11 | Disposition: A | Discharge status: Home / Self Care | Payer: 59 | Source: Ambulatory Visit | Attending: Family Medicine | Admitting: Family Medicine

## 2017-11-11 ENCOUNTER — Encounter: Payer: Self-pay | Admitting: Family Medicine

## 2017-11-11 ENCOUNTER — Telehealth: Payer: Self-pay | Admitting: Adult Health

## 2017-11-11 DIAGNOSIS — N912 Amenorrhea, unspecified: Secondary | ICD-10-CM | POA: Diagnosis not present

## 2017-11-11 LAB — HCG, QUANTITATIVE, PREGNANCY: hCG, Beta Chain, Quant, S: <1 m[IU]/mL (ref <5)

## 2017-11-11 NOTE — Telephone Encounter (Signed)
Spoke with patient who c/o allergy-type symptoms with nasal congestion, cough, PND, HA, itchy eyes, runny nose Patient discussed with TP who recommended OTC therapies including saline nasal spray, Zyrtec at bedtime as needed, Delsym twice daily as needed for cough.  If symptoms do not improve, would recommend scheduling a formal allergy consultation.  In the meantime, follow recommendations and follow up with PCP.  Patient voiced her understanding and denied any questions/concerns at this time.

## 2018-01-03 ENCOUNTER — Ambulatory Visit
Admission: EM | Admit: 2018-01-03 | Discharge: 2018-01-03 | Disposition: A | Discharge status: Home / Self Care | Payer: 59 | Attending: Family Medicine | Admitting: Family Medicine

## 2018-01-03 ENCOUNTER — Other Ambulatory Visit: Payer: Self-pay

## 2018-01-03 ENCOUNTER — Encounter: Payer: Self-pay | Admitting: Gynecology

## 2018-01-03 DIAGNOSIS — B9789 Other viral agents as the cause of diseases classified elsewhere: Secondary | ICD-10-CM | POA: Insufficient documentation

## 2018-01-03 DIAGNOSIS — J069 Acute upper respiratory infection, unspecified: Secondary | ICD-10-CM | POA: Diagnosis not present

## 2018-01-03 DIAGNOSIS — J9801 Acute bronchospasm: Secondary | ICD-10-CM | POA: Diagnosis not present

## 2018-01-03 HISTORY — DX: Unspecified asthma, uncomplicated: J45.909

## 2018-01-03 MED ORDER — PREDNISONE 20 MG PO TABS: ORAL_TABLET | ORAL | 0 refills | Status: DC

## 2018-01-03 MED ORDER — HYDROCOD POLST-CPM POLST ER 10-8 MG/5ML PO SUER: 5.0000 mL | Freq: Every evening | ORAL | 0 refills | Status: DC | PRN

## 2018-01-03 MED ORDER — BENZONATATE 100 MG PO CAPS: 100.0000 mg | ORAL_CAPSULE | Freq: Three times a day (TID) | ORAL | 0 refills | Status: DC | PRN

## 2018-01-03 MED ORDER — ALBUTEROL SULFATE HFA 108 (90 BASE) MCG/ACT IN AERS: 2.0000 | INHALATION_SPRAY | RESPIRATORY_TRACT | 0 refills | Status: DC | PRN

## 2018-01-03 NOTE — Discharge Instructions (Signed)
Take medication as prescribed. Rest. Drink plenty of fluids.  ° °Follow up with your primary care physician this week as needed. Return to Urgent care for new or worsening concerns.  ° °

## 2018-01-03 NOTE — ED Triage Notes (Signed)
Pt/ c/o cough / chest congestion x 4 days.

## 2018-01-03 NOTE — ED Provider Notes (Signed)
[facility] ____________________________________________  Time seen: Approximately 11:07 AM  I have reviewed the triage vital signs and the nursing notes.   HISTORY  Chief Complaint No chief complaint on file.   HPI Breanna Lamb is a 27 y.o. female presenting for evaluation of 4 days of nasal congestion, nasal drainage and cough.  States having green nasal drainage as well as coughing up green mucus.  Denies hemoptysis.  States cough has been disrupting sleep.  States some chest heaviness with the congestion.  Did hear some wheezing in her chest last night.  Has a history of childhood asthma but no recent asthma issues and no longer has albuterol inhaler.  Denies chest pain or shortness of breath.  Has continued to remain active.  No accompanying fevers.  Does work in pulmonology and frequently around sick people.  Has tried over-the-counter cough and congestion medication without much change.  Continues to overall eat and drink well but decreased appetite.  Intermittent sore throat, mostly with coughing.  Denies other aggravating leaving factors.  Reports otherwise doing well.  Park Liter P, DO: PCP Patient's last menstrual period was 12/24/2017.Denies pregnancy    Past Medical History:  Diagnosis Date  . Allergic rhinitis   . Asthma   . Benign positional vertigo   . Bunion   . Contraceptive management   . Depression   . Genital herpes   . Henoch-Schonlein purpura ([place])   . Lymphadenopathy   . Neck pain   . Obesity   . Ovarian cyst, right   . Poor concentration   . Premenstrual dysphoric syndrome   . Premenstrual dysphoric syndrome   . Screening for venereal disease   . Skin lesion     Patient Active Problem List   Diagnosis Date Noted  . Chronic tension-type headache, not intractable 01/31/2016  . Premenstrual dysphoric syndrome     Past Surgical History:  Procedure Laterality Date  . BUNIONECTOMY  2010 and 2011  . CESAREAN SECTION    .  mirena IUD  01/2014  . OVARIAN CYST REMOVAL  August 2014     No current facility-administered medications for this encounter.   Current Outpatient Medications:  .  FLUoxetine (PROZAC) 40 MG capsule, Take 1 capsule (40 mg total) by mouth daily., Disp: 30 capsule, Rfl: 3 .  albuterol (PROVENTIL HFA;VENTOLIN HFA) 108 (90 Base) MCG/ACT inhaler, Inhale 2 puffs into the lungs every 4 (four) hours as needed for wheezing., Disp: 1 Inhaler, Rfl: 0 .  benzonatate (TESSALON PERLES) 100 MG capsule, Take 1 capsule (100 mg total) by mouth 3 (three) times daily as needed for cough., Disp: 15 capsule, Rfl: 0 .  chlorpheniramine-HYDROcodone (TUSSIONEX PENNKINETIC ER) 10-8 MG/5ML SUER, Take 5 mLs by mouth at bedtime as needed for cough. do not drive or operate machinery while taking as can cause drowsiness., Disp: 50 mL, Rfl: 0 .  FLUoxetine HCl 60 MG TABS, Take 60 mg by mouth daily., Disp: 30 tablet, Rfl: 3 .  predniSONE (DELTASONE) 20 MG tablet, Take 2 tablets (40 mg) by mouth daily for 3 days, then 1 tablet (20mg ) daily for 2 days, Disp: 8 tablet, Rfl: 0  Allergies Nickel  Family History  Problem Relation Age of Onset  . Hypertension Mother   . Mental illness Mother        Depression, Anxiety  . Hypertension Father   . Hypertension Brother   . Hypertension Maternal Grandmother   . Diabetes Maternal Grandmother   . Hyperlipidemia Maternal Grandmother   .  Heart disease Maternal Grandmother   . COPD Maternal Grandmother   . Kidney disease Maternal Grandmother   . Mental illness Maternal Grandmother   . Hypertension Sister   . Mental illness Sister   . Heart disease Maternal Grandfather   . Heart attack Maternal Grandfather   . Cancer Paternal Grandmother     Social History Social History   Tobacco Use  . Smoking status: Never Smoker  . Smokeless tobacco: Never Used  Substance Use Topics  . Alcohol use: No  . Drug use: No    Review of Systems Constitutional: No fever. ENT: as above.    Cardiovascular: Denies chest pain. As above Respiratory: Denies shortness of breath. Gastrointestinal: No abdominal pain.   Musculoskeletal: Negative for back pain.  No lower extremity swelling. Skin: Negative for rash.  ____________________________________________   PHYSICAL EXAM:  VITAL SIGNS: ED Triage Vitals  Enc Vitals Group     BP 01/03/18 1024 (!) 146/71     Pulse Rate 01/03/18 1024 76     Resp 01/03/18 1024 16     Temp 01/03/18 1024 98.8 F (37.1 C)     Temp Source 01/03/18 1024 Oral     SpO2 01/03/18 1024 100 %     Weight 01/03/18 1025 227 lb (103 kg)     Height 01/03/18 1025 5\' 1"  (1.549 m)     Head Circumference --      Peak Flow --      Pain Score 01/03/18 1025 5     Pain Loc --      Pain Edu? --      Excl. in [place]? --    Vitals:   01/03/18 1024 01/03/18 1025 01/03/18 1100  BP: (!) 146/71  (!) 123/54  Pulse: 76    Resp: 16    Temp: 98.8 F (37.1 C)    TempSrc: Oral    SpO2: 100%    Weight:  227 lb (103 kg)   Height:  5\' 1"  (1.549 m)      Constitutional: Alert and oriented. Well appearing and in no acute distress. Eyes: Conjunctivae are normal.  Head: Atraumatic. No sinus tenderness to palpation. No swelling. No erythema.  Ears: no erythema, normal TMs bilaterally.   Nose:Nasal congestion   Mouth/Throat: Mucous membranes are moist. No pharyngeal erythema. No tonsillar swelling or exudate.  Neck: No stridor.  No cervical spine tenderness to palpation. Hematological/Lymphatic/Immunilogical: No cervical lymphadenopathy. Cardiovascular: Normal rate, regular rhythm. Grossly normal heart sounds.  Good peripheral circulation. Respiratory: Normal respiratory effort.  No retractions. No wheezes, rales or rhonchi. Good air movement.  Occasional cough in room noted with bronchospasm. Musculoskeletal: Ambulatory with steady gait.  No lower extremity edema noted bilaterally. Neurologic:  Normal speech and language. No gait instability. Skin:  Skin appears warm,  dry and intact. No rash noted. Psychiatric: Mood and affect are normal. Speech and behavior are normal. ___________________________________________   LABS (all labs ordered are listed, but only abnormal results are displayed)  Labs Reviewed - No data to display   PROCEDURES Procedures    INITIAL IMPRESSION / ASSESSMENT AND PLAN / ED COURSE  Pertinent labs & imaging results that were available during my care of the patient were reviewed by me and considered in my medical decision making (see chart for details).  Well-appearing patient.  No acute distress.  Suspect viral upper respiratory infection with bronchospasm.  Encourage rest, fluids, supportive care, over-the-counter congestion medication.  We will also prescribe prednisone, albuterol, PRN Tussionex and Tessalon  Perles.  Discussed her follow-up and return parameters.Discussed indication, risks and benefits of medications with patient.  Discussed follow up with Primary care physician this week. Discussed follow up and return parameters including no resolution or any worsening concerns. Patient verbalized understanding and agreed to plan.   ____________________________________________   FINAL CLINICAL IMPRESSION(S) / ED DIAGNOSES  Final diagnoses:  Viral URI with cough  Bronchospasm     ED Discharge Orders         Ordered    predniSONE (DELTASONE) 20 MG tablet     01/03/18 1109    albuterol (PROVENTIL HFA;VENTOLIN HFA) 108 (90 Base) MCG/ACT inhaler  Every 4 hours PRN     01/03/18 1109    chlorpheniramine-HYDROcodone (TUSSIONEX PENNKINETIC ER) 10-8 MG/5ML SUER  At bedtime PRN     01/03/18 1109    benzonatate (TESSALON PERLES) 100 MG capsule  3 times daily PRN     01/03/18 1109           Note: This dictation was prepared with Dragon dictation along with smaller phrase technology. Any transcriptional errors that result from this process are unintentional.         Marylene Land, NP 01/03/18 1248

## 2018-01-06 ENCOUNTER — Telehealth: Payer: 59 | Admitting: Physician Assistant

## 2018-01-06 DIAGNOSIS — B9689 Other specified bacterial agents as the cause of diseases classified elsewhere: Secondary | ICD-10-CM

## 2018-01-06 DIAGNOSIS — J208 Acute bronchitis due to other specified organisms: Secondary | ICD-10-CM | POA: Diagnosis not present

## 2018-01-06 MED ORDER — AZITHROMYCIN 250 MG PO TABS: ORAL_TABLET | ORAL | 0 refills | Status: AC

## 2018-01-06 NOTE — Progress Notes (Signed)
We are sorry that you are not feeling well.  Here is how we plan to help!  Based on your presentation I believe you most likely have A cough due to bacteria.  When patients have a fever and a productive cough with a change in color or increased sputum production, we are concerned about bacterial bronchitis.  If left untreated it can progress to pneumonia.  If your symptoms do not improve with your treatment plan it is important that you contact your provider.   I have prescribed Azithromyin 250 mg: two tablets now and then one tablet daily for 4 additonal days      From your responses in the eVisit questionnaire you describe inflammation in the upper respiratory tract which is causing a significant cough.  This is commonly called Bronchitis and has four common causes:   Allergies Viral Infections Acid Reflux Bacterial Infection Allergies, viruses and acid reflux are treated by controlling symptoms or eliminating the cause. An example might be a cough caused by taking certain blood pressure medications. You stop the cough by changing the medication. Another example might be a cough caused by acid reflux. Controlling the reflux helps control the cough.  USE OF BRONCHODILATOR ("RESCUE") INHALERS: There is a risk from using your bronchodilator too frequently.  The risk is that over-reliance on a medication which only relaxes the muscles surrounding the breathing tubes can reduce the effectiveness of medications prescribed to reduce swelling and congestion of the tubes themselves.  Although you feel brief relief from the bronchodilator inhaler, your asthma may actually be worsening with the tubes becoming more swollen and filled with mucus.  This can delay other crucial treatments, such as oral steroid medications. If you need to use a bronchodilator inhaler daily, several times per day, you should discuss this with your provider.  There are probably better treatments that could be used to keep your asthma  under control.     HOME CARE Only take medications as instructed by your medical team. Complete the entire course of an antibiotic. Drink plenty of fluids and get plenty of rest. Avoid close contacts especially the very young and the elderly Cover your mouth if you cough or cough into your sleeve. Always remember to wash your hands A steam or ultrasonic humidifier can help congestion.   GET HELP RIGHT AWAY IF: You develop worsening fever. You become short of breath You cough up blood. Your symptoms persist after you have completed your treatment plan MAKE SURE YOU  Understand these instructions. Will watch your condition. Will get help right away if you are not doing well or get worse.  Your e-visit answers were reviewed by a board certified advanced clinical practitioner to complete your personal care plan.  Depending on the condition, your plan could have included both over the counter or prescription medications. If there is a problem please reply once you have received a response from your provider. Your safety is important to [place].  If you have drug allergies check your prescription carefully.    You can use MyChart to ask questions about today's visit, request a non-urgent call back, or ask for a work or school excuse for 24 hours related to this e-Visit. If it has been greater than 24 hours you will need to follow up with your provider, or enter a new e-Visit to address those concerns. You will get an e-mail in the next two days asking about your experience.  I hope that your e-visit has been valuable  and will speed your recovery. Thank you for using e-visits.   ===View-only below this line===   ----- Message -----    From: [name]    Sent: 01/06/2018  8:11 AM EST      To: E-Visit Mailing List Subject: E-Visit Submission: Cough  E-Visit Submission: Cough --------------------------------  Question: How long have you been coughing? Answer:   7 or more  days  Question: How would you describe the cough? Answer:   A cough from congested lungs  Question: How often are you coughing? Answer:   In spasms that come and go  Question: Does the cough prevent you from sleeping at night? Answer:   Yes  Question: What other symptoms have you experienced with the cough? Answer:   Runny nose  Question: Do you have a fever? Answer:   No, I do not have a fever  Question: Are you coughing up any mucus? Answer:   I am coughing up a lot of mucus  Question: Do you use a maintenance inhaler? Answer:   No  Question: Do you use a rescue inhaler (such as Ventolin?) Answer:   Yes  Question: Have you previously required a prescription for prednisone for cough? Answer:   Yes  Question: Are you diabetic? Answer:   No  Question: Are you pregnant? Answer:   I am confident that I am not pregnant  Question: Are you breastfeeding? Answer:   No  Question: What is the appearance of the mucus? Answer:   The mucus has changed from clear to colored  Question: Do you have any of the following? Answer:   None of the above  Question: Do you smoke? Answer:   No  Question: Have you ever smoked? Answer:   I have never smoked  Question: Are there people you know with similar symptoms? Answer:   No  Question: Are you experiencing any of the following? Answer:   None of  the above  Question: Are you having difficulty breathing? Answer:   No  Question: Is your coughing worse when you are exposed to pollen, dust, or other things in the environment? Answer:   I dont know  Question: Have you been treated for a similar cough in the past? Answer:   Yes  Question: What treatments have worked in the past?  Answer:   zpak  Question: What treatment(s) in the past have been unsuccessful? Answer:   currently on day 4 of prednisone taper with no relief in cough- cough is productive with green mucus.  Question: Have you ever been diagnosed with asthma,  bronchitis, or lung disease? Answer:   Yes  Question: Please enter a few details about your earlier diagnosis and treatment Answer:   hx of child hood asthma.            Recently seen at urgent care and dx with bronchitis. prescribed prednisone with no relief in sx.  Question: Have you recently started on any medications for your heart or for blood pressure? Answer:   No  Question: Have you recently been hospitalized? Answer:   No  Question: Please list your medication allergies that you may have ? (If 'none' , please list as 'none') Answer:   none  Question: Please list any additional comments  Answer:

## 2018-01-27 ENCOUNTER — Telehealth: Payer: 59 | Admitting: Nurse Practitioner

## 2018-01-27 DIAGNOSIS — J069 Acute upper respiratory infection, unspecified: Secondary | ICD-10-CM | POA: Diagnosis not present

## 2018-01-27 MED ORDER — FLUCONAZOLE 150 MG PO TABS: 150.0000 mg | ORAL_TABLET | Freq: Once | ORAL | 0 refills | Status: AC

## 2018-01-27 NOTE — Progress Notes (Signed)
We are sorry you are not feeling well.  Here is how we plan to help!  Based on what you have shared with me, it looks like you may have a viral upper respiratory infection or a "common cold".  Colds are caused by a large number of viruses; however, rhinovirus is the most common cause.   Symptoms of the common cold vary from person to person, with common symptoms including sore throat, cough, and malaise.  A low-grade fever of 100.4 may present, but is often uncommon.  Symptoms vary however, and are closely related to a person's age or underlying illnesses.  The most common symptoms associated with the common cold are nasal discharge or congestion, cough, sneezing, headache and pressure in the ears and face.  Cold symptoms usually persist for about 3 to 10 days, but can last up to 2 weeks.  It is important to know that colds do not cause serious illness or complications in most cases.    The common cold is transmitted from person to person, with the most common method of transmission being a person's hands.  The virus is able to live on the skin and can infect other persons for up to 2 hours after direct contact.  Also, colds are transmitted when someone coughs or sneezes; thus, it is important to cover the mouth to reduce this risk.  To keep the spread of the common cold at bay, good hand hygiene is very important.  This is an infection that is most likely caused by a virus. There are no specific treatments for the common cold other than to help you with the symptoms until the infection runs its course.    For nasal congestion, you may use an oral decongestants such as Mucinex D or if you have glaucoma or high blood pressure use plain Mucinex.  Saline nasal spray or nasal drops can help and can safely be used as often as needed for congestion.  For your congestion, I have prescribed Fluticasone nasal spray one spray in each nostril twice a day  If you do not have a history of heart disease, hypertension,  diabetes or thyroid disease, prostate/bladder issues or glaucoma, you may also use Sudafed to treat nasal congestion.  It is highly recommended that you consult with a pharmacist or your primary care physician to ensure this medication is safe for you to take.     If you have a cough, you may use cough suppressants such as Delsym and Robitussin.  If you have glaucoma or high blood pressure, you can also use Coricidin HBP.     If you have a sore or scratchy throat, use a saltwater gargle-  to  teaspoon of salt dissolved in a 4-ounce to 8-ounce glass of warm water.  Gargle the solution for approximately 15-30 seconds and then spit.  It is important not to swallow the solution.  You can also use throat lozenges/cough drops and Chloraseptic spray to help with throat pain or discomfort.  Warm or cold liquids can also be helpful in relieving throat pain.  For headache, pain or general discomfort, you can use Ibuprofen or Tylenol as directed.   Some authorities believe that zinc sprays or the use of Echinacea may shorten the course of your symptoms.   HOME CARE . Only take medications as instructed by your medical team. . Be sure to drink plenty of fluids. Water is fine as well as fruit juices, sodas and electrolyte beverages. You may want to stay   away from caffeine or alcohol. If you are nauseated, try taking small sips of liquids. How do you know if you are getting enough fluid? Your urine should be a pale yellow or almost colorless. . Get rest. . Taking a steamy shower or using a humidifier may help nasal congestion and ease sore throat pain. You can place a towel over your head and breathe in the steam from hot water coming from a faucet. . Using a saline nasal spray works much the same way. . Cough drops, hard candies and sore throat lozenges may ease your cough. . Avoid close contacts especially the very young and the elderly . Cover your mouth if you cough or sneeze . Always remember to wash  your hands.   GET HELP RIGHT AWAY IF: . You develop worsening fever. . If your symptoms do not improve within 10 days . You develop yellow or green discharge from your nose over 3 days. . You have coughing fits . You develop a severe head ache or visual changes. . You develop shortness of breath, difficulty breathing or start having chest pain . Your symptoms persist after you have completed your treatment plan  MAKE SURE YOU   Understand these instructions.  Will watch your condition.  Will get help right away if you are not doing well or get worse.  Your e-visit answers were reviewed by a board certified advanced clinical practitioner to complete your personal care plan. Depending upon the condition, your plan could have included both over the counter or prescription medications. Please review your pharmacy choice. If there is a problem, you may call our nursing hot line at and have the prescription routed to another pharmacy. Your safety is important to [place]. If you have drug allergies check your prescription carefully.   You can use MyChart to ask questions about today's visit, request a non-urgent call back, or ask for a work or school excuse for 24 hours related to this e-Visit. If it has been greater than 24 hours you will need to follow up with your provider, or enter a new e-Visit to address those concerns. You will get an e-mail in the next two days asking about your experience.  I hope that your e-visit has been valuable and will speed your recovery. Thank you for using e-visits.

## 2018-02-24 ENCOUNTER — Other Ambulatory Visit: Payer: Self-pay

## 2018-02-24 ENCOUNTER — Encounter: Payer: Self-pay | Admitting: Nurse Practitioner

## 2018-02-24 ENCOUNTER — Ambulatory Visit (INDEPENDENT_AMBULATORY_CARE_PROVIDER_SITE_OTHER): Payer: 59 | Admitting: Nurse Practitioner

## 2018-02-24 VITALS — BP 118/72 | HR 78 | Temp 98.1°F | Ht 61.0 in | Wt 220.1 lb

## 2018-02-24 DIAGNOSIS — E162 Hypoglycemia, unspecified: Secondary | ICD-10-CM | POA: Diagnosis not present

## 2018-02-24 LAB — PREGNANCY, URINE: Preg Test, Ur: NEGATIVE

## 2018-02-24 LAB — GLUCOSE HEMOCUE WAIVED: Glu Hemocue Waived: 100 mg/dL — ABNORMAL HIGH (ref 65–99)

## 2018-02-24 NOTE — Patient Instructions (Signed)
Hypoglycemia Hypoglycemia is when the sugar (glucose) level in your blood is too low. Signs of low blood sugar may include:  Feeling: ? Hungry. ? Worried or nervous (anxious). ? Sweaty and clammy. ? Confused. ? Dizzy. ? Sleepy. ? Sick to your stomach (nauseous).  Having: ? A fast heartbeat. ? A headache. ? A change in your vision. ? Tingling or no feeling (numbness) around your mouth, lips, or tongue. ? Jerky movements that you cannot control (seizure).  Having trouble with: ? Moving (coordination). ? Sleeping. ? Passing out (fainting). ? Getting upset easily (irritability). Low blood sugar can happen to people who have diabetes and people who do not have diabetes. Low blood sugar can happen quickly, and it can be an emergency. Treating low blood sugar Low blood sugar is often treated by eating or drinking something sugary right away, such as:  Fruit juice, 4-6 oz (120-150 mL).  Regular soda (not diet soda), 4-6 oz (120-150 mL).  Low-fat milk, 4 oz (120 mL).  Several pieces of hard candy.  Sugar or honey, 1 Tbsp (15 mL). Treating low blood sugar if you have diabetes If you can think clearly and swallow safely, follow the 15:15 rule:  Take 15 grams of a fast-acting carb (carbohydrate). Talk with your doctor about how much you should take.  Always keep a source of fast-acting carb with you, such as: ? Sugar tablets (glucose pills). Take 3-4 pills. ? 6-8 pieces of hard candy. ? 4-6 oz (120-150 mL) of fruit juice. ? 4-6 oz (120-150 mL) of regular (not diet) soda. ? 1 Tbsp (15 mL) honey or sugar.  Check your blood sugar 15 minutes after you take the carb.  If your blood sugar is still at or below 70 mg/dL (3.9 mmol/L), take 15 grams of a carb again.  If your blood sugar does not go above 70 mg/dL (3.9 mmol/L) after 3 tries, get help right away.  After your blood sugar goes back to normal, eat a meal or a snack within 1 hour.  Treating very low blood sugar If your  blood sugar is at or below 54 mg/dL (3 mmol/L), you have very low blood sugar (severe hypoglycemia). This may also cause:  Passing out.  Jerky movements you cannot control (seizure).  Losing consciousness (coma). This is an emergency. Do not wait to see if the symptoms will go away. Get medical help right away. Call your local emergency services (911 in the [place] Do not drive yourself to the hospital. If you have very low blood sugar and you cannot eat or drink, you may need a glucagon shot (injection). A family member or friend should learn how to check your blood sugar and how to give you a glucagon shot. Ask your doctor if you need to have a glucagon shot kit at home. Follow these instructions at home: General instructions  Take over-the-counter and prescription medicines only as told by your doctor.  Stay aware of your blood sugar as told by your doctor.  Limit alcohol intake to no more than 1 drink a day for nonpregnant women and 2 drinks a day for men. One drink equals 12 oz of beer (355 mL), 5 oz of wine (148 mL), or 1 oz of hard liquor (44 mL).  Keep all follow-up visits as told by your doctor. This is important. If you have diabetes:   Follow your diabetes care plan as told by your doctor. Make sure you: ? Know the signs of low blood sugar. ?  Take your medicines as told. °? Follow your exercise and meal plan. °? Eat on time. Do not skip meals. °? Check your blood sugar as often as told by your doctor. Always check it before and after exercise. °? Follow your sick day plan when you cannot eat or drink normally. Make this plan ahead of time with your doctor. °· Share your diabetes care plan with: °? Your work or school. °? People you live with. °· Check your pee (urine) for ketones: °? When you are sick. °? As told by your doctor. °· Carry a card or wear jewelry that says you have diabetes. °Contact a doctor if: °· You have trouble keeping your blood sugar in your target  range. °· You have low blood sugar often. °Get help right away if: °· You still have symptoms after you eat or drink something sugary. °· Your blood sugar is at or below 54 mg/dL (3 mmol/L). °· You have jerky movements that you cannot control. °· You pass out. °These symptoms may be an emergency. Do not wait to see if the symptoms will go away. Get medical help right away. Call your local emergency services (911 in the [place] Do not drive yourself to the hospital. °Summary °· Hypoglycemia happens when the level of sugar (glucose) in your blood is too low. °· Low blood sugar can happen to people who have diabetes and people who do not have diabetes. Low blood sugar can happen quickly, and it can be an emergency. °· Make sure you know the signs of low blood sugar and know how to treat it. °· Always keep a source of sugar (fast-acting carb) with you to treat low blood sugar. °This information is not intended to replace advice given to you by your health care provider. Make sure you discuss any questions you have with your health care provider. °Document Released: 03/20/2009 Document Revised: 06/17/2017 Document Reviewed: 01/27/2015 °Elsevier Interactive Patient Education © 2019 [employer] ° °

## 2018-02-24 NOTE — Progress Notes (Signed)
BP 118/72 (BP Location: Left Arm, Patient Position: Sitting, Cuff Size: Large)   Pulse 78   Temp 98.1 F (36.7 C) (Oral)   Ht 5\' 1"  (1.549 m)   Wt 220 lb 2 oz (99.8 kg)   SpO2 99%   BMI 41.59 kg/m    Subjective:    Patient ID: Breanna Lamb, female    DOB: [birth year], 28 y.o.   MRN: [id number]  HPI: Breanna Lamb is a 28 y.o. female  Chief Complaint  Patient presents with  . Hypotension    Pt states arms get tingly, gets really tired, hot and sweaty   HYPOGLYCEMIA: States she has been having episodes of her arms being tingly and her body getting tired with hot and sweaty feeling occasionally over the past week.  This morning her BS was 61, this is after she ate bacon and "large breakfast".   Then after lunch today her BS was 49, after pizza and salad.  In 2018 she had similar spells prior to her wedding.  At that time was taking Phentermine, which she is not taking at this time and has not taken since 2018.  At this time she reports she has lost 20 pounds in 1 1/2 months, the only thing she has changed is eating smaller portions.  She denies any fad diet changes or increased exercise.  Denies SOB, CP, or palpitations with current episodes.  On review of labs over past 4 years her sugars have ranged from 61 to 88 and April 2019 A1C was 4.9.  In the morning she reports she wakes up with nausea and reports occasional nausea during day time hours.  LMP was 01/26/2018.   She does endorse an episode of abdominal pain, comparable to GERD, a night or so ago which resolved on own.  Denies cold/heat intolerance, fatigue, fever.  Denies any recent falls or viral illness, although last viral episode visit appears to have been on 01/27/2018.  Reports no recent medication changes or additions of new medication.    Relevant past medical, surgical, family and social history reviewed and updated as indicated. Interim medical history since our last visit reviewed. Allergies and medications  reviewed and updated.  Review of Systems  Constitutional: Negative for activity change, appetite change, diaphoresis, fatigue and fever.  Respiratory: Negative for cough, chest tightness and shortness of breath.   Cardiovascular: Negative for chest pain, palpitations and leg swelling.  Gastrointestinal: Positive for nausea. Negative for abdominal distention, abdominal pain, constipation, diarrhea and vomiting.  Endocrine: Negative for cold intolerance, heat intolerance, polydipsia, polyphagia and polyuria.  Neurological: Positive for dizziness. Negative for syncope, weakness, light-headedness, numbness and headaches.  Psychiatric/Behavioral: Negative.     Per HPI unless specifically indicated above     Objective:    BP 118/72 (BP Location: Left Arm, Patient Position: Sitting, Cuff Size: Large)   Pulse 78   Temp 98.1 F (36.7 C) (Oral)   Ht 5\' 1"  (1.549 m)   Wt 220 lb 2 oz (99.8 kg)   SpO2 99%   BMI 41.59 kg/m   Wt Readings from Last 3 Encounters:  02/24/18 220 lb 2 oz (99.8 kg)  01/03/18 227 lb (103 kg)  06/17/17 227 lb 6 oz (103.1 kg)    Physical Exam Vitals signs and nursing note reviewed.  Constitutional:      General: She is awake.     Appearance: She is well-developed.  HENT:     Head: Normocephalic.     Right Ear:  Hearing normal.     Left Ear: Hearing normal.     Nose: Nose normal.     Mouth/Throat:     Mouth: Mucous membranes are moist.  Eyes:     General: Lids are normal.        Right eye: No discharge.        Left eye: No discharge.     Conjunctiva/sclera: Conjunctivae normal.     Pupils: Pupils are equal, round, and reactive to light.  Neck:     Musculoskeletal: Normal range of motion and neck supple.     Thyroid: No thyromegaly.     Vascular: No carotid bruit or JVD.  Cardiovascular:     Rate and Rhythm: Normal rate and regular rhythm.     Heart sounds: Normal heart sounds. No murmur. No gallop.   Pulmonary:     Effort: Pulmonary effort is normal.       Breath sounds: Normal breath sounds.  Abdominal:     General: Bowel sounds are normal.     Palpations: Abdomen is soft. There is no hepatomegaly or splenomegaly.  Musculoskeletal:     Right lower leg: No edema.     Left lower leg: No edema.  Lymphadenopathy:     Cervical: No cervical adenopathy.  Skin:    General: Skin is warm and dry.  Neurological:     Mental Status: She is alert and oriented to person, place, and time.  Psychiatric:        Attention and Perception: Attention normal.        Mood and Affect: Mood normal.        Behavior: Behavior normal. Behavior is cooperative.        Thought Content: Thought content normal.        Judgment: Judgment normal.     Results for orders placed or performed during the hospital encounter of 11/11/17  hCG, quantitative, pregnancy  Result Value Ref Range   hCG, Beta Chain, Quant, S <1 <5 mIU/mL      Assessment & Plan:   Problem List Items Addressed This Visit      Endocrine   Hypoglycemia - Primary    Intermittent episodes over past week.  BS in office today 100.  Negative pregnancy urine test.  Will obtain CBC, CMP, TSH, A1C today.  Recommend frequent snacking during daytime hours and adequate hydration.  Return in one week for follow up.  If worsening or increased symptoms return sooner or immediately visit urgent care or ER.      Relevant Orders   CBC with Differential/Platelet   Comprehensive metabolic panel   Pregnancy, urine   Thyroid Panel With TSH   HgB A1c   Glucose Hemocue Waived       Follow up plan: Return in about 1 week (around 03/03/2018) for Hypoglycemia.

## 2018-02-24 NOTE — Assessment & Plan Note (Signed)
Intermittent episodes over past week.  BS in office today 100.  Negative pregnancy urine test.  Will obtain CBC, CMP, TSH, A1C today.  Recommend frequent snacking during daytime hours and adequate hydration.  Return in one week for follow up.  If worsening or increased symptoms return sooner or immediately visit urgent care or ER.

## 2018-02-25 LAB — CBC WITH DIFFERENTIAL/PLATELET
Basophils Absolute: 0 10*3/uL (ref 0.0–0.2)
Basos: 1 %
EOS (ABSOLUTE): 0.1 10*3/uL (ref 0.0–0.4)
Eos: 2 %
Hematocrit: 39.2 % (ref 34.0–46.6)
Hemoglobin: 13.4 g/dL (ref 11.1–15.9)
Immature Grans (Abs): 0 10*3/uL (ref 0.0–0.1)
Immature Granulocytes: 0 %
Lymphocytes Absolute: 2.5 10*3/uL (ref 0.7–3.1)
Lymphs: 37 %
MCH: 28 pg (ref 26.6–33.0)
MCHC: 34.2 g/dL (ref 31.5–35.7)
MCV: 82 fL (ref 79–97)
MONOS ABS: 0.6 10*3/uL (ref 0.1–0.9)
Monocytes: 8 %
Neutrophils Absolute: 3.5 10*3/uL (ref 1.4–7.0)
Neutrophils: 52 %
Platelets: 262 10*3/uL (ref 150–450)
RBC: 4.79 x10E6/uL (ref 3.77–5.28)
RDW: 12.8 % (ref 11.7–15.4)
WBC: 6.7 10*3/uL (ref 3.4–10.8)

## 2018-02-25 LAB — COMPREHENSIVE METABOLIC PANEL
ALT: 14 IU/L (ref 0–32)
AST: 17 IU/L (ref 0–40)
Albumin/Globulin Ratio: 1.9 (ref 1.2–2.2)
Albumin: 4.5 g/dL (ref 3.9–5.0)
Alkaline Phosphatase: 78 IU/L (ref 39–117)
BILIRUBIN TOTAL: 0.9 mg/dL (ref 0.0–1.2)
BUN/Creatinine Ratio: 12 (ref 9–23)
BUN: 10 mg/dL (ref 6–20)
CO2: 20 mmol/L (ref 20–29)
Calcium: 9.3 mg/dL (ref 8.7–10.2)
Chloride: 104 mmol/L (ref 96–106)
Creatinine, Ser: 0.82 mg/dL (ref 0.57–1.00)
GFR calc Af Amer: 114 mL/min/{1.73_m2} (ref >59)
GFR calc non Af Amer: 99 mL/min/{1.73_m2} (ref >59)
Globulin, Total: 2.4 g/dL (ref 1.5–4.5)
Glucose: 100 mg/dL — ABNORMAL HIGH (ref 65–99)
Potassium: 4.2 mmol/L (ref 3.5–5.2)
Sodium: 139 mmol/L (ref 134–144)
Total Protein: 6.9 g/dL (ref 6.0–8.5)

## 2018-02-25 LAB — THYROID PANEL WITH TSH
Free Thyroxine Index: 2.3 (ref 1.2–4.9)
T3 Uptake Ratio: 28 % (ref 24–39)
T4 TOTAL: 8.1 ug/dL (ref 4.5–12.0)
TSH: 1.16 u[IU]/mL (ref 0.450–4.500)

## 2018-02-25 LAB — HEMOGLOBIN A1C
Est. average glucose Bld gHb Est-mCnc: 105 mg/dL
Hgb A1c MFr Bld: 5.3 % (ref 4.8–5.6)

## 2018-03-02 ENCOUNTER — Ambulatory Visit: Payer: 59 | Admitting: Family Medicine

## 2018-03-02 ENCOUNTER — Encounter: Payer: Self-pay | Admitting: Family Medicine

## 2018-03-02 VITALS — BP 122/72 | HR 67 | Temp 98.0°F | Wt 218.8 lb

## 2018-03-02 DIAGNOSIS — E162 Hypoglycemia, unspecified: Secondary | ICD-10-CM | POA: Diagnosis not present

## 2018-03-02 LAB — BAYER DCA HB A1C WAIVED: HB A1C (BAYER DCA - WAIVED): 5.1 % (ref <7.0)

## 2018-03-02 NOTE — Assessment & Plan Note (Signed)
Labs normal. Discussed making sure to have protein with meals. Continue to monitor. Call with any concerns.

## 2018-03-02 NOTE — Progress Notes (Signed)
BP 122/72   Pulse 67   Temp 98 F (36.7 C) (Oral)   Wt 218 lb 12.8 oz (99.2 kg)   LMP 02/27/2018 (Exact Date)   SpO2 99%   BMI 41.34 kg/m    Subjective:    Patient ID: Breanna Lamb, female    DOB: 1990/01/24, 28 y.o.   MRN: [id number]  HPI: Breanna Lamb is a 28 y.o. female  Chief Complaint  Patient presents with  . Hypoglycemia    1 week f/up    Sugars have been running low. She notes that after she eats, her sugars tend to drop. She notes that she is still eating normal amounts- has been eating smaller portions. She has not really been changing her diet, eating normal things- puddings and carbs. She doe note that she doesn't try to monitor the amount of protein she is getting. She has not gone below 61 over the weekend. Otherwise feeling well. No other concerns or complaints at this time.   Relevant past medical, surgical, family and social history reviewed and updated as indicated. Interim medical history since our last visit reviewed. Allergies and medications reviewed and updated.  Review of Systems  Constitutional: Negative.   Respiratory: Negative.   Cardiovascular: Negative.   Gastrointestinal: Negative.   Neurological: Negative.   Psychiatric/Behavioral: Negative.     Per HPI unless specifically indicated above     Objective:    BP 122/72   Pulse 67   Temp 98 F (36.7 C) (Oral)   Wt 218 lb 12.8 oz (99.2 kg)   LMP 02/27/2018 (Exact Date)   SpO2 99%   BMI 41.34 kg/m   Wt Readings from Last 3 Encounters:  03/02/18 218 lb 12.8 oz (99.2 kg)  02/24/18 220 lb 2 oz (99.8 kg)  01/03/18 227 lb (103 kg)    Physical Exam Vitals signs and nursing note reviewed.  Constitutional:      General: She is not in acute distress.    Appearance: Normal appearance. She is not ill-appearing, toxic-appearing or diaphoretic.  HENT:     Head: Normocephalic and atraumatic.     Right Ear: External ear normal.     Left Ear: External ear normal.     Nose:  Nose normal.     Mouth/Throat:     Mouth: Mucous membranes are moist.     Pharynx: Oropharynx is clear.  Eyes:     General: No scleral icterus.       Right eye: No discharge.        Left eye: No discharge.     Extraocular Movements: Extraocular movements intact.     Conjunctiva/sclera: Conjunctivae normal.     Pupils: Pupils are equal, round, and reactive to light.  Neck:     Musculoskeletal: Normal range of motion and neck supple.  Cardiovascular:     Rate and Rhythm: Normal rate and regular rhythm.     Pulses: Normal pulses.     Heart sounds: Normal heart sounds. No murmur. No friction rub. No gallop.   Pulmonary:     Effort: Pulmonary effort is normal. No respiratory distress.     Breath sounds: Normal breath sounds. No stridor. No wheezing, rhonchi or rales.  Chest:     Chest wall: No tenderness.  Musculoskeletal: Normal range of motion.  Skin:    General: Skin is warm and dry.     Capillary Refill: Capillary refill takes less than 2 seconds.     Coloration: Skin is not  jaundiced or pale.     Findings: No bruising, erythema, lesion or rash.  Neurological:     General: No focal deficit present.     Mental Status: She is alert and oriented to person, place, and time. Mental status is at baseline.  Psychiatric:        Mood and Affect: Mood normal.        Behavior: Behavior normal.        Thought Content: Thought content normal.        Judgment: Judgment normal.     Results for orders placed or performed in visit on 02/24/18  CBC with Differential/Platelet  Result Value Ref Range   WBC 6.7 3.4 - 10.8 x10E3/uL   RBC 4.79 3.77 - 5.28 x10E6/uL   Hemoglobin 13.4 11.1 - 15.9 g/dL   Hematocrit 39.2 34.0 - 46.6 %   MCV 82 79 - 97 fL   MCH 28.0 26.6 - 33.0 pg   MCHC 34.2 31.5 - 35.7 g/dL   RDW 12.8 11.7 - 15.4 %   Platelets 262 150 - 450 x10E3/uL   Neutrophils 52 Not Estab. %   Lymphs 37 Not Estab. %   Monocytes 8 Not Estab. %   Eos 2 Not Estab. %   Basos 1 Not Estab. %    Neutrophils Absolute 3.5 1.4 - 7.0 x10E3/uL   Lymphocytes Absolute 2.5 0.7 - 3.1 x10E3/uL   Monocytes Absolute 0.6 0.1 - 0.9 x10E3/uL   EOS (ABSOLUTE) 0.1 0.0 - 0.4 x10E3/uL   Basophils Absolute 0.0 0.0 - 0.2 x10E3/uL   Immature Granulocytes 0 Not Estab. %   Immature Grans (Abs) 0.0 0.0 - 0.1 x10E3/uL  Comprehensive metabolic panel  Result Value Ref Range   Glucose 100 (H) 65 - 99 mg/dL   BUN 10 6 - 20 mg/dL   Creatinine, Ser 0.82 0.57 - 1.00 mg/dL   GFR calc non Af Amer 99 >59 mL/min/1.73   GFR calc Af Amer 114 >59 mL/min/1.73   BUN/Creatinine Ratio 12 9 - 23   Sodium 139 134 - 144 mmol/L   Potassium 4.2 3.5 - 5.2 mmol/L   Chloride 104 96 - 106 mmol/L   CO2 20 20 - 29 mmol/L   Calcium 9.3 8.7 - 10.2 mg/dL   Total Protein 6.9 6.0 - 8.5 g/dL   Albumin 4.5 3.9 - 5.0 g/dL   Globulin, Total 2.4 1.5 - 4.5 g/dL   Albumin/Globulin Ratio 1.9 1.2 - 2.2   Bilirubin Total 0.9 0.0 - 1.2 mg/dL   Alkaline Phosphatase 78 39 - 117 IU/L   AST 17 0 - 40 IU/L   ALT 14 0 - 32 IU/L  Pregnancy, urine  Result Value Ref Range   Preg Test, Ur Negative Negative  Thyroid Panel With TSH  Result Value Ref Range   TSH 1.160 0.450 - 4.500 uIU/mL   T4, Total 8.1 4.5 - 12.0 ug/dL   T3 Uptake Ratio 28 24 - 39 %   Free Thyroxine Index 2.3 1.2 - 4.9  HgB A1c  Result Value Ref Range   Hgb A1c MFr Bld 5.3 4.8 - 5.6 %   Est. average glucose Bld gHb Est-mCnc 105 mg/dL  Glucose Hemocue Waived  Result Value Ref Range   Glu Hemocue Waived 100 (H) 65 - 99 mg/dL      Assessment & Plan:   Problem List Items Addressed This Visit      Endocrine   Hypoglycemia - Primary    Labs normal. Discussed making  sure to have protein with meals. Continue to monitor. Call with any concerns.       Relevant Orders   Bayer DCA Hb A1c Waived   Comprehensive metabolic panel       Follow up plan: Return After 6.11.20, for Physical.

## 2018-03-02 NOTE — Patient Instructions (Signed)
Preventing Hypoglycemia Hypoglycemia occurs when the level of sugar (glucose) in the blood is too low. Hypoglycemia can happen in people who do or do not have diabetes (diabetes mellitus). It can develop quickly, and it can be a medical emergency. For most people with diabetes, a blood glucose level below 70 mg/dL (3.9 mmol/L) is considered hypoglycemia. Glucose is a type of sugar that provides the body's main source of energy. Certain hormones (insulin and glucagon) control the level of glucose in the blood. Insulin lowers blood glucose, and glucagon increases blood glucose. Hypoglycemia can result from having too much insulin in the bloodstream, or from not eating enough food that contains glucose. Your risk for hypoglycemia is higher:  If you take insulin or diabetes medicines to help lower your blood glucose or help your body make more insulin.  If you skip or delay a meal or snack.  If you are ill.  During and after exercise. You can prevent hypoglycemia by working with your health care provider to adjust your meal plan as needed and by taking other precautions. How can hypoglycemia affect me? Mild symptoms Mild hypoglycemia may not cause any symptoms. If you do have symptoms, they may include:  Hunger.  Anxiety.  Sweating and feeling clammy.  Dizziness or feeling light-headed.  Sleepiness.  Nausea.  Increased heart rate.  Headache.  Blurry vision.  Irritability.  Tingling or numbness around the mouth, lips, or tongue.  A change in coordination.  Restless sleep. If mild hypoglycemia is not recognized and treated, it can quickly become moderate or severe hypoglycemia. Moderate symptoms Moderate hypoglycemia can cause:  Mental confusion and poor judgment.  Behavior changes.  Weakness.  Irregular heartbeat. Severe symptoms Severe hypoglycemia is a medical emergency. It can cause:  Fainting.  Seizures.  Loss of consciousness (coma).  Death. What  nutrition changes can be made?  Work with your health care provider or diet and nutrition specialist (dietitian) to make a healthy meal plan that is right for you. Follow your meal plan carefully.  Eat meals at regular times.  If recommended by your health care provider, have snacks between meals.  Donot skip or delay meals or snacks. You can be at risk for hypoglycemia if you are not getting enough carbohydrates. What lifestyle changes can be made?   Work closely with your health care provider to manage your blood glucose. Make sure you know: ? Your goal blood glucose levels. ? How and when to check your blood glucose. ? The symptoms of hypoglycemia. It is important to treat it right away to keep it from becoming severe.  Do not drink alcohol on an empty stomach.  When you are ill, check your blood glucose more often than usual. Follow your sick day plan whenever you cannot eat or drink normally. Make this plan in advance with your health care provider.  Always check your blood glucose before, during, and after exercise. How is this treated? This condition can often be treated by immediately eating or drinking something that contains sugar, such as:  Fruit juice, 4-6 oz (120-150 mL).  Regular (not diet) soda, 4-6 oz (120-150 mL).  Low-fat milk, 4 oz (120 mL).  Several pieces of hard candy.  Sugar or honey, 1 Tbsp (15 mL). Treating hypoglycemia if you have diabetes If you are alert and able to swallow safely, follow the 15:15 rule:  Take 15 grams of a rapid-acting carbohydrate. Talk with your health care provider about how much you should take.  Rapid-acting options  Several pieces of hard candy.  · Sugar or honey, 1 Tbsp (15 mL).  Treating hypoglycemia if you have diabetes  If you are alert and able to swallow safely, follow the 15:15 rule:  · Take 15 grams of a rapid-acting carbohydrate. Talk with your health care provider about how much you should take.  · Rapid-acting options include:  ? Glucose pills (take 15 grams).  ? 6-8 pieces of hard candy.  ? 4-6 oz (120-150 mL) of fruit juice.  ? 4-6 oz (120-150 mL) of regular (not diet) soda.  · Check your blood glucose 15 minutes after you take the carbohydrate.  · If the repeat blood glucose level is still at or below 70 mg/dL (3.9 mmol/L),  take 15 grams of a carbohydrate again.  · If your blood glucose level does not increase above 70 mg/dL (3.9 mmol/L) after 3 tries, seek emergency medical care.  · After your blood glucose level returns to normal, eat a meal or a snack within 1 hour.  Treating severe hypoglycemia  Severe hypoglycemia is when your blood glucose level is at or below 54 mg/dL (3 mmol/L). Severe hypoglycemia is a medical emergency. Get medical help right away.  If you have severe hypoglycemia and you cannot eat or drink, you may need an injection of glucagon. A family member or close friend should learn how to check your blood glucose and how to give you a glucagon injection. Ask your health care provider if you need to have an emergency glucagon injection kit available.  Severe hypoglycemia may need to be treated in a hospital. The treatment may include getting glucose through an IV. You may also need treatment for the cause of your hypoglycemia.  Where to find more information  · American Diabetes Association: www.diabetes.org  · National Institute of Diabetes and Digestive and Kidney Diseases: www.niddk.nih.gov  Contact a health care provider if:  · You have problems keeping your blood glucose in your target range.  · You have frequent episodes of hypoglycemia.  Get help right away if:  · You continue to have hypoglycemia symptoms after eating or drinking something containing glucose.  · Your blood glucose level is at or below 54 mg/dL (3 mmol/L).  · You faint.  · You have a seizure.  These symptoms may represent a serious problem that is an emergency. Do not wait to see if the symptoms will go away. Get medical help right away. Call your local emergency services (911 in the [place]  Summary  · Know the symptoms of hypoglycemia, and when you are at risk for it (such as during exercise or when you are sick). Check your blood glucose often when you are at risk for hypoglycemia.  · Hypoglycemia can develop quickly, and it can be dangerous  if it is not treated right away. If you have a history of severe hypoglycemia, make sure you know how to use your glucagon injection kit.  · Make sure you know how to treat hypoglycemia. Keep a carbohydrate snack available when you may be at risk for hypoglycemia.  This information is not intended to replace advice given to you by your health care provider. Make sure you discuss any questions you have with your health care provider.  Document Released: 08/21/2016 Document Revised: 06/16/2017 Document Reviewed: 08/21/2016  Elsevier Interactive Patient Education © 2019 [employer]

## 2018-03-03 LAB — COMPREHENSIVE METABOLIC PANEL
ALT: 15 IU/L (ref 0–32)
AST: 16 IU/L (ref 0–40)
Albumin/Globulin Ratio: 2.1 (ref 1.2–2.2)
Albumin: 4.6 g/dL (ref 3.9–5.0)
Alkaline Phosphatase: 79 IU/L (ref 39–117)
BUN/Creatinine Ratio: 15 (ref 9–23)
BUN: 12 mg/dL (ref 6–20)
Bilirubin Total: 1 mg/dL (ref 0.0–1.2)
CO2: 20 mmol/L (ref 20–29)
Calcium: 9 mg/dL (ref 8.7–10.2)
Chloride: 104 mmol/L (ref 96–106)
Creatinine, Ser: 0.82 mg/dL (ref 0.57–1.00)
GFR calc Af Amer: 114 mL/min/{1.73_m2} (ref >59)
GFR calc non Af Amer: 99 mL/min/{1.73_m2} (ref >59)
Globulin, Total: 2.2 g/dL (ref 1.5–4.5)
Glucose: 75 mg/dL (ref 65–99)
POTASSIUM: 4.1 mmol/L (ref 3.5–5.2)
Sodium: 138 mmol/L (ref 134–144)
Total Protein: 6.8 g/dL (ref 6.0–8.5)

## 2018-03-09 ENCOUNTER — Ambulatory Visit: Payer: Self-pay | Admitting: Family Medicine

## 2018-03-27 ENCOUNTER — Encounter: Payer: Self-pay | Admitting: Family Medicine

## 2018-03-27 ENCOUNTER — Ambulatory Visit: Payer: 59 | Admitting: Family Medicine

## 2018-03-27 ENCOUNTER — Other Ambulatory Visit: Payer: Self-pay

## 2018-03-27 VITALS — BP 115/79 | HR 73 | Temp 98.4°F

## 2018-03-27 DIAGNOSIS — J029 Acute pharyngitis, unspecified: Secondary | ICD-10-CM | POA: Diagnosis not present

## 2018-03-27 MED ORDER — AMOXICILLIN 875 MG PO TABS: 875.0000 mg | ORAL_TABLET | Freq: Two times a day (BID) | ORAL | 0 refills | Status: DC

## 2018-03-27 NOTE — Progress Notes (Signed)
BP 115/79   Pulse 73   Temp 98.4 F (36.9 C) (Oral)   LMP 02/27/2018 (Exact Date)   SpO2 98%    Subjective:    Patient ID: Breanna Lamb, female    DOB: 1990-07-10, 28 y.o.   MRN: [id number]  HPI: Breanna Lamb is a 28 y.o. female  Chief Complaint  Patient presents with  . Sore Throat    pt states the left side of her throat and left ear have been hurting since yesterday morning. States she woke up with a headache yesterdat morning and a low grade temperature.   Slight nasal congestion, significant left sided sore throat radiating up into ear, headache, low grade fever, malaise x 1 day. Has been using OTC cold and flu medicines without relief. No body aches, sweats, chills, N/V/D, cough, chest tightness, SOB. No recent sick contacts or travel.   Relevant past medical, surgical, family and social history reviewed and updated as indicated. Interim medical history since our last visit reviewed. Allergies and medications reviewed and updated.  Review of Systems  Per HPI unless specifically indicated above     Objective:    BP 115/79   Pulse 73   Temp 98.4 F (36.9 C) (Oral)   LMP 02/27/2018 (Exact Date)   SpO2 98%   Wt Readings from Last 3 Encounters:  03/02/18 218 lb 12.8 oz (99.2 kg)  02/24/18 220 lb 2 oz (99.8 kg)  01/03/18 227 lb (103 kg)    Physical Exam Vitals signs and nursing note reviewed.  Constitutional:      Appearance: Normal appearance.  HENT:     Head: Atraumatic.     Right Ear: Tympanic membrane and external ear normal.     Left Ear: Tympanic membrane and external ear normal.     Mouth/Throat:     Mouth: Mucous membranes are moist.     Pharynx: Pharyngeal swelling (tonsillar edema, left, cryptic) and posterior oropharyngeal erythema present. No oropharyngeal exudate.  Eyes:     Extraocular Movements: Extraocular movements intact.     Conjunctiva/sclera: Conjunctivae normal.  Neck:     Musculoskeletal: Normal range of motion and neck  supple.  Cardiovascular:     Rate and Rhythm: Normal rate and regular rhythm.     Heart sounds: Normal heart sounds.  Pulmonary:     Effort: Pulmonary effort is normal.     Breath sounds: Normal breath sounds. No wheezing.  Abdominal:     General: Bowel sounds are normal.     Palpations: Abdomen is soft.     Tenderness: There is no abdominal tenderness. There is no right CVA tenderness, left CVA tenderness or guarding.  Musculoskeletal: Normal range of motion.  Lymphadenopathy:     Cervical: Cervical adenopathy (left) present.  Skin:    General: Skin is warm and dry.  Neurological:     Mental Status: She is alert and oriented to person, place, and time.  Psychiatric:        Mood and Affect: Mood normal.        Thought Content: Thought content normal.    Results for orders placed or performed in visit on 03/02/18  Bayer DCA Hb A1c Waived  Result Value Ref Range   HB A1C (BAYER DCA - WAIVED) 5.1 <7.0 %  Comprehensive metabolic panel  Result Value Ref Range   Glucose 75 65 - 99 mg/dL   BUN 12 6 - 20 mg/dL   Creatinine, Ser 0.82 0.57 - 1.00 mg/dL  GFR calc non Af Amer 99 >59 mL/min/1.73   GFR calc Af Amer 114 >59 mL/min/1.73   BUN/Creatinine Ratio 15 9 - 23   Sodium 138 134 - 144 mmol/L   Potassium 4.1 3.5 - 5.2 mmol/L   Chloride 104 96 - 106 mmol/L   CO2 20 20 - 29 mmol/L   Calcium 9.0 8.7 - 10.2 mg/dL   Total Protein 6.8 6.0 - 8.5 g/dL   Albumin 4.6 3.9 - 5.0 g/dL   Globulin, Total 2.2 1.5 - 4.5 g/dL   Albumin/Globulin Ratio 2.1 1.2 - 2.2   Bilirubin Total 1.0 0.0 - 1.2 mg/dL   Alkaline Phosphatase 79 39 - 117 IU/L   AST 16 0 - 40 IU/L   ALT 15 0 - 32 IU/L      Assessment & Plan:   Problem List Items Addressed This Visit    None    Visit Diagnoses    Sore throat    -  Primary   Rapid strep neg, await cx. Sxs and exam consistent with strep so will send abx in case worsening or not improving in next few days. Supportive care reviewed   Relevant Orders   Rapid  Strep screen([facility])       Follow up plan: Return if symptoms worsen or fail to improve.

## 2018-03-30 LAB — RAPID STREP SCREEN (MED CTR MEBANE ONLY): Strep Gp A Ag, IA W/Reflex: NEGATIVE

## 2018-03-30 LAB — CULTURE, GROUP A STREP

## 2018-04-28 ENCOUNTER — Other Ambulatory Visit: Payer: Self-pay | Admitting: Family Medicine

## 2018-04-28 NOTE — Telephone Encounter (Signed)
Requested Prescriptions  Pending Prescriptions Disp Refills  . FLUoxetine HCl 60 MG TABS [Pharmacy Med Name: FLUoxetine HCL 60 MG TABS 60 TAB] 30 tablet 0    Sig: TAKE 1 TABLET (60 MG) BY MOUTH DAILY.     Psychiatry:  Antidepressants - SSRI Passed - 04/28/2018  8:40 AM      Passed - Completed PHQ-2 or PHQ-9 in the last 360 days.      Passed - Valid encounter within last 6 months    Recent Outpatient Visits          1 month ago Sore throat   [place], [place]   1 month ago Hypoglycemia   [place], [place], DO   2 months ago Hypoglycemia   [facility] [place], Barbaraann Faster, NP   10 months ago Routine general medical examination at a health care facility   [facility], [place], DO   1 year ago Premenstrual dysphoric syndrome   [facility] [name], DO      Future Appointments            In 1 month Johnson, Barb Merino, DO [employer], [facility]

## 2018-06-02 ENCOUNTER — Other Ambulatory Visit: Payer: Self-pay | Admitting: Family Medicine

## 2018-06-24 ENCOUNTER — Encounter: Payer: Self-pay | Admitting: Family Medicine

## 2018-06-26 ENCOUNTER — Encounter: Payer: 59 | Admitting: Family Medicine

## 2018-06-29 NOTE — Progress Notes (Signed)
Gynecology Annual Exam  PCP: [name], DO  Chief Complaint:  Chief Complaint  Patient presents with  . Gynecologic Exam    no concerns    History of Present Illness: Patient is a 28 y.o. G2P2002 presents for annual exam. The patient has no complaints today.   LMP: Patient's last menstrual period was 06/26/2018 (exact date). Monthly, last 4-5 days, medium flow, no dysmenorrhea, no clots  The patient is sexually active. She currently uses natural family planning for contraception. She denies dyspareunia.    There is no notable family history of breast or ovarian cancer in her family.  The patient wears seatbelts: yes.  The patient has regular exercise: not asked.    The patient denies current symptoms of depression.     Patientis a 28 y.o. Y3K1601 female, who presents for the evaluation of weight gain. She has gained 40 pounds primarily over 2 years. The patient states the following issues have contributed to her weight problem: lifestyle, life stressors.  The patient has no additional symptoms. The patient specifically denies memory loss, muscle weakness, excessive thirst, and polyuria. Weight related co-morbidities include none.She has tried phentermine interventions in the past with good success (17lbs weight loss)   Review of Systems: Review of Systems  Constitutional: Negative for chills and fever.  HENT: Negative for congestion.   Respiratory: Negative for cough and shortness of breath.   Cardiovascular: Negative for chest pain and palpitations.  Gastrointestinal: Negative for abdominal pain, constipation, diarrhea, heartburn, nausea and vomiting.  Genitourinary: Negative for dysuria, frequency and urgency.  Skin: Negative for itching and rash.  Neurological: Negative for dizziness and headaches.  Endo/Heme/Allergies: Negative for polydipsia.  Psychiatric/Behavioral: Negative for depression.    Past Medical History:  Past Medical History:  Diagnosis Date   . Allergic rhinitis   . Asthma   . Benign positional vertigo   . Bunion   . Contraceptive management   . Depression   . Genital herpes   . Henoch-Schonlein purpura ([place])   . Lymphadenopathy   . Neck pain   . Obesity   . Ovarian cyst, right   . Poor concentration   . Premenstrual dysphoric syndrome   . Premenstrual dysphoric syndrome   . Screening for venereal disease   . Skin lesion     Past Surgical History:  Past Surgical History:  Procedure Laterality Date  . BUNIONECTOMY  2010 and 2011  . CESAREAN SECTION    . mirena IUD  01/2014  . OVARIAN CYST REMOVAL  August 2014    Gynecologic History:  Patient's last menstrual period was 06/26/2018 (exact date). Contraception: 12/2013 Mirena IUD  position confirmed on ultrasound 06/2017, removed 06/10/2017 currently using natural family planning Last Pap: Results were: 04/12/2016 no abnormalities   Obstetric History: [id number]  Family History:  Family History  Problem Relation Age of Onset  . Hypertension Mother   . Mental illness Mother        Depression, Anxiety  . Hypertension Father   . Hypertension Brother   . Hypertension Maternal Grandmother   . Diabetes Maternal Grandmother   . Hyperlipidemia Maternal Grandmother   . Heart disease Maternal Grandmother   . COPD Maternal Grandmother   . Kidney disease Maternal Grandmother   . Mental illness Maternal Grandmother   . Hypertension Sister   . Mental illness Sister   . Heart disease Maternal Grandfather   . Heart attack Maternal Grandfather   . Cancer Paternal Grandmother  Social History:  Social History   Socioeconomic History  . Marital status: Married    Spouse name: Not on file  . Number of children: Not on file  . Years of education: Not on file  . Highest education level: Not on file  Occupational History  . Not on file  Social Needs  . Financial resource strain: Not on file  . Food insecurity    Worry: Not on file    Inability: Not on file  .  Transportation needs    Medical: Not on file    Non-medical: Not on file  Tobacco Use  . Smoking status: Never Smoker  . Smokeless tobacco: Never Used  Substance and Sexual Activity  . Alcohol use: No  . Drug use: No  . Sexual activity: Yes    Birth control/protection: None  Lifestyle  . Physical activity    Days per week: Not on file    Minutes per session: Not on file  . Stress: Not on file  Relationships  . Social Herbalist on phone: Not on file    Gets together: Not on file    Attends religious service: Not on file    Active member of club or organization: Not on file    Attends meetings of clubs or organizations: Not on file    Relationship status: Not on file  . Intimate partner violence    Fear of current or ex partner: Not on file    Emotionally abused: Not on file    Physically abused: Not on file    Forced sexual activity: Not on file  Other Topics Concern  . Not on file  Social History Narrative  . Not on file    Allergies:  Allergies  Allergen Reactions  . Nickel     Medications: Prior to Admission medications   Medication Sig Start Date End Date Taking? Authorizing Provider  albuterol (PROVENTIL HFA;VENTOLIN HFA) 108 (90 Base) MCG/ACT inhaler Inhale 2 puffs into the lungs every 4 (four) hours as needed for wheezing. Patient not taking: Reported on 02/24/2018 01/03/18   Marylene Land, NP  amoxicillin (AMOXIL) 875 MG tablet Take 1 tablet (875 mg total) by mouth 2 (two) times daily. 03/27/18   Volney American, PA-C  FLUoxetine HCl 60 MG TABS TAKE 1 TABLET BY MOUTH DAILY. 06/02/18   [name], DO    Physical Exam Vitals: Blood pressure 110/70, height 5\' 1"  (1.549 m), weight 228 lb 6.4 oz (103.6 kg), last menstrual period 06/26/2018. Body mass index is 43.16 kg/m.   General: NAD HEENT: normocephalic, anicteric Thyroid: no enlargement, no palpable nodules Pulmonary: No increased work of breathing, CTAB Cardiovascular: RRR,  distal pulses 2+ Breast: Breast symmetrical, no tenderness, no palpable nodules or masses, no skin or nipple retraction present, no nipple discharge.  No axillary or supraclavicular lymphadenopathy. Abdomen: NABS, soft, non-tender, non-distended.  Umbilicus without lesions.  No hepatomegaly, splenomegaly or masses palpable. No evidence of hernia  Genitourinary:  External: Normal external female genitalia.  Normal urethral meatus, normal Bartholin's and Skene's glands.    Vagina: Normal vaginal mucosa, no evidence of prolapse.    Cervix: Grossly normal in appearance, no bleeding  Uterus: Non-enlarged, mobile, normal contour.  No CMT  Adnexa: ovaries non-enlarged, no adnexal masses  Rectal: deferred  Lymphatic: no evidence of inguinal lymphadenopathy Extremities: no edema, erythema, or tenderness Neurologic: Grossly intact Psychiatric: mood appropriate, affect full  Female chaperone present for pelvic and breast  portions of  the physical exam    Assessment: 28 y.o. G2P2002 routine annual exam, medical weight loss initiation  Plan: Problem List Items Addressed This Visit    None    Visit Diagnoses    Encounter for gynecological examination without abnormal finding    -  Primary   Breast screening       Encounter for surveillance of contraceptive device       Class 3 severe obesity without serious comorbidity with body mass index (BMI) of 40.0 to 44.9 in adult, unspecified obesity type ([place])       Relevant Medications   phentermine (ADIPEX-P) 37.5 MG tablet      1) STI screening  was notoffered and therefore not obtained  2)  ASCCP guidelines and rational discussed.  Patient opts for every 3 years screening interval  3) Contraception - the patient is currently using  rhythm method.  She is happy with her current form of contraception and plans to continue We discussed safe sex practices to reduce her furture risk of STI's.     1) 1500 Calorie ADA Diet  2) Patient education  given regarding appropriate lifestyle changes for weight loss including: regular physical activity, healthy coping strategies, caloric restriction and healthy eating patterns.  3) Patient will be started on weight loss medication. The risks and benefits and side effects of medication, such as Adipex (Phenteramine) ,  Tenuate (Diethylproprion), Belviq (lorcarsin), Contrave (buproprion/naltrexone), Qsymia (phentermine/topiramate), and Saxenda (liraglutide) is discussed. The pros and cons of suppressing appetite and boosting metabolism is discussed. Risks of tolerence and addiction is discussed for selected agents discussed. Use of medicine will ne short term, such as 3-4 months at a time followed by a period of time off of the medicine to avoid these risks and side effects for Adipex, Qsymia, and Tenuate discussed. Pt to call with any negative side effects and agrees to keep follow up appts.  4) Comorbidity Screening - not obtained  5) Encouraged weekly weight monitorig to track progress and sample 1 week food diary, discussed NOOM app  6) Follow up in 4 weeks to assess response     [name], MD, [place], [facility] 06/30/2018, 9:04 AM

## 2018-06-30 ENCOUNTER — Ambulatory Visit (INDEPENDENT_AMBULATORY_CARE_PROVIDER_SITE_OTHER): Payer: 59 | Admitting: Obstetrics and Gynecology

## 2018-06-30 ENCOUNTER — Encounter: Payer: Self-pay | Admitting: Obstetrics and Gynecology

## 2018-06-30 ENCOUNTER — Other Ambulatory Visit: Payer: Self-pay

## 2018-06-30 VITALS — BP 110/70 | Ht 61.0 in | Wt 228.4 lb

## 2018-06-30 DIAGNOSIS — Z01419 Encounter for gynecological examination (general) (routine) without abnormal findings: Secondary | ICD-10-CM

## 2018-06-30 DIAGNOSIS — Z1239 Encounter for other screening for malignant neoplasm of breast: Secondary | ICD-10-CM

## 2018-06-30 DIAGNOSIS — Z3040 Encounter for surveillance of contraceptives, unspecified: Secondary | ICD-10-CM

## 2018-06-30 MED ORDER — PHENTERMINE HCL 37.5 MG PO TABS: 37.5000 mg | ORAL_TABLET | Freq: Every day | ORAL | 0 refills | Status: DC

## 2018-07-02 ENCOUNTER — Other Ambulatory Visit: Payer: Self-pay | Admitting: Obstetrics and Gynecology

## 2018-07-02 DIAGNOSIS — N3000 Acute cystitis without hematuria: Secondary | ICD-10-CM

## 2018-07-02 MED ORDER — NITROFURANTOIN MONOHYD MACRO 100 MG PO CAPS: 100.0000 mg | ORAL_CAPSULE | Freq: Two times a day (BID) | ORAL | 0 refills | Status: AC

## 2018-07-03 ENCOUNTER — Other Ambulatory Visit: Payer: Self-pay

## 2018-07-03 ENCOUNTER — Ambulatory Visit: Payer: 59

## 2018-07-03 DIAGNOSIS — N3000 Acute cystitis without hematuria: Secondary | ICD-10-CM | POA: Diagnosis not present

## 2018-07-03 MED ORDER — AMOXICILLIN 875 MG PO TABS: 875.0000 mg | ORAL_TABLET | Freq: Two times a day (BID) | ORAL | 0 refills | Status: DC

## 2018-07-05 LAB — URINE CULTURE

## 2018-07-21 ENCOUNTER — Other Ambulatory Visit: Payer: Self-pay | Admitting: Family Medicine

## 2018-07-28 ENCOUNTER — Ambulatory Visit: Payer: 59 | Admitting: Obstetrics and Gynecology

## 2018-07-29 ENCOUNTER — Other Ambulatory Visit: Payer: Self-pay

## 2018-07-29 ENCOUNTER — Ambulatory Visit (INDEPENDENT_AMBULATORY_CARE_PROVIDER_SITE_OTHER): Payer: 59 | Admitting: Obstetrics and Gynecology

## 2018-07-29 ENCOUNTER — Encounter: Payer: Self-pay | Admitting: Obstetrics and Gynecology

## 2018-07-29 VITALS — Ht 61.0 in | Wt 223.0 lb

## 2018-07-29 DIAGNOSIS — O99210 Obesity complicating pregnancy, unspecified trimester: Secondary | ICD-10-CM | POA: Insufficient documentation

## 2018-07-29 DIAGNOSIS — A6009 Herpesviral infection of other urogenital tract: Secondary | ICD-10-CM

## 2018-07-29 DIAGNOSIS — O99211 Obesity complicating pregnancy, first trimester: Secondary | ICD-10-CM

## 2018-07-29 DIAGNOSIS — Z3A01 Less than 8 weeks gestation of pregnancy: Secondary | ICD-10-CM

## 2018-07-29 DIAGNOSIS — O98311 Other infections with a predominantly sexual mode of transmission complicating pregnancy, first trimester: Secondary | ICD-10-CM

## 2018-07-29 DIAGNOSIS — O09291 Supervision of pregnancy with other poor reproductive or obstetric history, first trimester: Secondary | ICD-10-CM

## 2018-07-29 DIAGNOSIS — Z3689 Encounter for other specified antenatal screening: Secondary | ICD-10-CM

## 2018-07-29 DIAGNOSIS — Z3480 Encounter for supervision of other normal pregnancy, unspecified trimester: Secondary | ICD-10-CM | POA: Insufficient documentation

## 2018-07-29 DIAGNOSIS — O0990 Supervision of high risk pregnancy, unspecified, unspecified trimester: Secondary | ICD-10-CM | POA: Insufficient documentation

## 2018-07-29 DIAGNOSIS — O98319 Other infections with a predominantly sexual mode of transmission complicating pregnancy, unspecified trimester: Secondary | ICD-10-CM

## 2018-07-29 DIAGNOSIS — Z8759 Personal history of other complications of pregnancy, childbirth and the puerperium: Secondary | ICD-10-CM | POA: Insufficient documentation

## 2018-07-29 DIAGNOSIS — O34219 Maternal care for unspecified type scar from previous cesarean delivery: Secondary | ICD-10-CM | POA: Insufficient documentation

## 2018-07-29 HISTORY — DX: Other infections with a predominantly sexual mode of transmission complicating pregnancy, unspecified trimester: O98.319

## 2018-07-29 HISTORY — DX: Herpesviral infection of other urogenital tract: A60.09

## 2018-07-29 MED ORDER — CONCEPT DHA 53.5-38-1 MG PO CAPS: 1.0000 | ORAL_CAPSULE | Freq: Every day | ORAL | 3 refills | Status: AC

## 2018-07-29 NOTE — Progress Notes (Signed)
I connected with Breanna Lamb on 07/29/18 at 11:30 AM EDT by telephone and verified that I am speaking with the correct person using two identifiers.   I discussed the limitations, risks, security and privacy concerns of performing an evaluation and management service by telephone and the availability of in person appointments. I also discussed with the patient that there may be a patient responsible charge related to this service. The patient expressed understanding and agreed to proceed.  The patient was at home I spoke with the patient from my workstation phone.  The names of people involved in this encounter were: Breanna Lamb , and Breanna Lamb  New Obstetric Patient H&P    Chief Complaint: "Desires prenatal care"   History of Present Illness: Patient is a 28 y.o. [id number] Not Hispanic or [place] female, presents with amenorrhea and positive home pregnancy test. Patient's last menstrual period was 06/26/2018 (exact date). and based on her  LMP, her EDD is Estimated Date of Delivery: 04/02/2019 and her EGA is 4 weeks 5 days.  Her last pap smear was 04/12/2016 and normal.    She had a urine pregnancy test which was positive 1 day(s)  ago. Her last menstrual period was normal Since her LMP she claims she has experienced fatigue. She denies vaginal bleeding. Her past medical history is noncontributory. Her prior pregnancies are notable for\ gestational hypertension, vaginal delivery G1, cesarean G2 for transverse lie.  Since her LMP, she admits to the use of tobacco products  no She claims she has gained   no  She admits close contact with children on a regular basis  yes  She has had chicken pox in the past yes She has had Tuberculosis exposures, symptoms, or previously tested positive for TB   no Current or past history of domestic violence. no  Genetic Screening/Teratology Counseling: (Includes patient, baby's father, or anyone in either family with:)   58. Patient's  age >/= 22 [facility]  no 2. Thalassemia ([place], [place], [place], or Asian background): MCV<80  no 3. Neural tube defect (meningomyelocele, spina bifida, anencephaly)  no 4. Congenital heart defect  no  5. Down syndrome  no 6. Tay-Sachs (Jewish, [place])  no 7. Canavan's Disease  no 8. Sickle cell disease or trait (African)  no  9. Hemophilia or other blood disorders  no  10. Muscular dystrophy  no  11. Cystic fibrosis  no  12. Huntington's Chorea  no  13. Mental retardation/autism  no 14. Other inherited genetic or chromosomal disorder  no 15. Maternal metabolic disorder (DM, PKU, etc)  no 16. Patient or FOB with a child with a birth defect not listed above no  16a. Patient or FOB with a birth defect themselves no 17. Recurrent pregnancy loss, or stillbirth  no  18. Any medications since LMP other than prenatal vitamins (include vitamins, supplements, OTC meds, drugs, alcohol)  no 19. Any other genetic/environmental exposure to discuss  no  Infection History:   1. Lives with someone with TB or TB exposed  no  2. Patient or partner has history of genital herpes  no 3. Rash or viral illness since LMP  no 4. History of STI (GC, CT, HPV, syphilis, HIV)  no 5. History of recent travel :  no  Other pertinent information:  Was on phentermine at time of conception    Review of Systems:10 point review of systems negative unless otherwise noted in HPI  Past Medical History:  Past Medical History:  Diagnosis Date  . Allergic rhinitis   . Asthma   . Benign positional vertigo   . Bunion   . Contraceptive management   . Depression   . Genital herpes   . Henoch-Schonlein purpura ([place])   . Lymphadenopathy   . Neck pain   . Obesity   . Ovarian cyst, right   . Poor concentration   . Premenstrual dysphoric syndrome   . Premenstrual dysphoric syndrome   . Screening for venereal disease   . Skin lesion     Past Surgical History:  Past Surgical History:  Procedure  Laterality Date  . BUNIONECTOMY  2010 and 2011  . CESAREAN SECTION    . mirena IUD  01/2014  . OVARIAN CYST REMOVAL  August 2014    Gynecologic History: Patient's last menstrual period was 06/26/2018 (exact date).  Obstetric History: [id number]  Family History:  Family History  Problem Relation Age of Onset  . Hypertension Mother   . Mental illness Mother        Depression, Anxiety  . Hypertension Father   . Hypertension Brother   . Hypertension Maternal Grandmother   . Diabetes Maternal Grandmother   . Hyperlipidemia Maternal Grandmother   . Heart disease Maternal Grandmother   . COPD Maternal Grandmother   . Kidney disease Maternal Grandmother   . Mental illness Maternal Grandmother   . Hypertension Sister   . Mental illness Sister   . Heart disease Maternal Grandfather   . Heart attack Maternal Grandfather   . Cancer Paternal Grandmother     Social History:  Social History   Socioeconomic History  . Marital status: Married    Spouse name: Not on file  . Number of children: Not on file  . Years of education: Not on file  . Highest education level: Not on file  Occupational History  . Not on file  Social Needs  . Financial resource strain: Not on file  . Food insecurity    Worry: Not on file    Inability: Not on file  . Transportation needs    Medical: Not on file    Non-medical: Not on file  Tobacco Use  . Smoking status: Never Smoker  . Smokeless tobacco: Never Used  Substance and Sexual Activity  . Alcohol use: No  . Drug use: No  . Sexual activity: Yes    Birth control/protection: None  Lifestyle  . Physical activity    Days per week: Not on file    Minutes per session: Not on file  . Stress: Not on file  Relationships  . Social Herbalist on phone: Not on file    Gets together: Not on file    Attends religious service: Not on file    Active member of club or organization: Not on file    Attends meetings of clubs or organizations: Not  on file    Relationship status: Not on file  . Intimate partner violence    Fear of current or ex partner: Not on file    Emotionally abused: Not on file    Physically abused: Not on file    Forced sexual activity: Not on file  Other Topics Concern  . Not on file  Social History Narrative  . Not on file    Allergies:  Allergies  Allergen Reactions  . Nickel     Medications: Prior to Admission medications   Medication Sig Start Date End Date Taking? Authorizing Provider  albuterol (PROVENTIL  HFA;VENTOLIN HFA) 108 (90 Base) MCG/ACT inhaler Inhale 2 puffs into the lungs every 4 (four) hours as needed for wheezing. 01/03/18  Yes Marylene Land, NP  FLUoxetine HCl 60 MG TABS TAKE 1 TABLET BY MOUTH DAILY. Patient not taking: Reported on 07/29/2018 07/21/18   Park Liter P, DO  Prenat-FeFum-FePo-FA-Omega 3 (CONCEPT DHA) 53.5-38-1 MG CAPS Take 1 tablet by mouth daily. 07/29/18 08/28/18  Breanna Mood, MD    Physical Exam Vitals: Height 5\' 1"  (1.549 m), weight 223 lb (101.2 kg), last menstrual period 06/26/2018.  No physical exam as this was a remote telephone visit to promote social distancing during the current COVID-19 Pandemic   Assessment: 28 y.o. [id number] at Unknown presenting to initiate prenatal care  Plan: 1) Avoid alcoholic beverages. 2) Patient encouraged not to smoke.  3) Discontinue the use of all non-medicinal drugs and chemicals.  4) Take prenatal vitamins daily.  5) Nutrition, food safety (fish, cheese advisories, and high nitrite foods) and exercise discussed. 6) Hospital and practice style discussed with cross coverage system.  7) Discussed COVID 19 implications for pregnant health care workers.  Patient work in pulmonology.  Breanna Mood, MD, [facility], [facility] 07/29/2018, 7:54 PM    lmp 06/26/2018 [redacted]w[redacted]d Fatigue proz  [id number]  rx PNV

## 2018-08-04 NOTE — Telephone Encounter (Signed)
Per Izora Gala to call patient to confirm questions. The Hospital setting is were patient's can have one visitor. As far a ambulatory setting are still patient only.

## 2018-08-12 ENCOUNTER — Ambulatory Visit (INDEPENDENT_AMBULATORY_CARE_PROVIDER_SITE_OTHER): Payer: 59

## 2018-08-12 ENCOUNTER — Other Ambulatory Visit: Payer: Self-pay

## 2018-08-12 ENCOUNTER — Encounter: Payer: Self-pay | Admitting: Obstetrics and Gynecology

## 2018-08-12 ENCOUNTER — Ambulatory Visit (INDEPENDENT_AMBULATORY_CARE_PROVIDER_SITE_OTHER): Payer: 59 | Admitting: Obstetrics and Gynecology

## 2018-08-12 VITALS — BP 112/78 | Wt 230.0 lb

## 2018-08-12 DIAGNOSIS — Z8759 Personal history of other complications of pregnancy, childbirth and the puerperium: Secondary | ICD-10-CM

## 2018-08-12 DIAGNOSIS — N8311 Corpus luteum cyst of right ovary: Secondary | ICD-10-CM

## 2018-08-12 DIAGNOSIS — Z113 Encounter for screening for infections with a predominantly sexual mode of transmission: Secondary | ICD-10-CM | POA: Diagnosis not present

## 2018-08-12 DIAGNOSIS — O3680X0 Pregnancy with inconclusive fetal viability, not applicable or unspecified: Secondary | ICD-10-CM | POA: Diagnosis not present

## 2018-08-12 DIAGNOSIS — O99210 Obesity complicating pregnancy, unspecified trimester: Secondary | ICD-10-CM | POA: Diagnosis not present

## 2018-08-12 DIAGNOSIS — Z3480 Encounter for supervision of other normal pregnancy, unspecified trimester: Secondary | ICD-10-CM

## 2018-08-12 DIAGNOSIS — O99211 Obesity complicating pregnancy, first trimester: Secondary | ICD-10-CM

## 2018-08-12 DIAGNOSIS — Z3A01 Less than 8 weeks gestation of pregnancy: Secondary | ICD-10-CM

## 2018-08-12 DIAGNOSIS — O09291 Supervision of pregnancy with other poor reproductive or obstetric history, first trimester: Secondary | ICD-10-CM

## 2018-08-12 DIAGNOSIS — O3481 Maternal care for other abnormalities of pelvic organs, first trimester: Secondary | ICD-10-CM | POA: Diagnosis not present

## 2018-08-12 DIAGNOSIS — Z3689 Encounter for other specified antenatal screening: Secondary | ICD-10-CM

## 2018-08-12 DIAGNOSIS — O34219 Maternal care for unspecified type scar from previous cesarean delivery: Secondary | ICD-10-CM

## 2018-08-12 NOTE — Progress Notes (Signed)
Routine Prenatal Care Visit  Subjective  Breanna Lamb is a 28 y.o. G3P2002 at [redacted]w[redacted]d being seen today for ongoing prenatal care.  She is currently monitored for the following issues for this low-risk pregnancy and has Premenstrual dysphoric syndrome; Chronic tension-type headache, not intractable; Hypoglycemia; Supervision of other normal pregnancy, antepartum; Maternal obesity, antepartum; History of cesarean section complicating pregnancy; History of gestational hypertension; and Genital herpes simplex virus (HSV) infection in mother affecting pregnancy on their problem list.  ----------------------------------------------------------------------------------- Patient reports no complaints.   Contractions: Not present. Vag. Bleeding: None.  Movement: Absent. Denies leaking of fluid.  ----------------------------------------------------------------------------------- The following portions of the patient's history were reviewed and updated as appropriate: allergies, current medications, past family history, past medical history, past social history, past surgical history and problem list. Problem list updated.   Objective  Blood pressure 112/78, weight 230 lb (104.3 kg), last menstrual period 06/26/2018. Pregravid weight 225 lb (102.1 kg) Total Weight Gain 5 lb (2.268 kg) Urinalysis:      Fetal Status: Fetal Heart Rate (bpm): a   Movement: Absent     General:  Alert, oriented and cooperative. Patient is in no acute distress.  Skin: Skin is warm and dry. No rash noted.   Cardiovascular: Normal heart rate noted  Respiratory: Normal respiratory effort, no problems with respiration noted  Abdomen: Soft, gravid, appropriate for gestational age. Pain/Pressure: Absent     Pelvic:  Cervical exam deferred        Extremities: Normal range of motion.     ental Status: Normal mood and affect. Normal behavior. Normal judgment and thought content.   US Ob Comp Less 14 Wks  Result Date:  08/12/2018 Patient Name: KALLYN DEMARCUS DOB: 01/15/1990 MRN: [id number] ULTRASOUND REPORT Location: [facility] Date of Service: 08/12/2018 Indications:dating Findings: There is a single gestational sac and yolk sac seen within the uterus. No fetal pole or heartbeat is seen at this time. Yolk sac is visualized and appears normal and early anatomy is normal. Amnion: is not visualized. Right Ovary is normal in appearance. Left Ovary is normal appearance. Corpus luteal cyst:  Right ovary Survey of the adnexa demonstrates no adnexal masses. There is no free peritoneal fluid in the cul de sac. Impression: There is a single gestational sac and yolk sac seen within the uterus. No fetal pole or heartbeat is seen at  this time. Recommendations: 1.Clinical correlation with the patient's History and Physical Exam. Gweneth Dimitri, RT There is a singleton gestation of undetermined viability.  The fetal biometry correlates with established dating. Detailed evaluation of the fetal anatomy is precluded by early gestational age.  It must be noted that a normal ultrasound particular at this early gestational age is unable to rule out fetal aneuploidy, risk of first trimester miscarriage, or anatomic birth defects. Recommend follow up ultrasound in 11 days from today.  "Society of Radiologyst in Ultrasound Guidelines for Transvaginal Ultrasonographic Diagnosis of Early Pregnancy Loss" and adopted in [place] Number 150, May 2015 (reaffirmed 2017) "Early Pregnancy Loss" [name], MD, [facility], [facility] 08/12/2018, 10:18 AM     Assessment   28 y.o. S3M1962 at [redacted]w[redacted]d by  04/12/2019, by Ultrasound presenting for routine prenatal visit  Plan   Pregnancy#3 Problems (from 06/26/18 to present)    Problem Noted Resolved   Supervision of other normal pregnancy, antepartum 07/29/2018 by [name], MD No   Maternal obesity, antepartum 07/29/2018 by [name], MD No  History of cesarean section complicating pregnancy 3/57/0177 by [name], MD No   History of gestational hypertension 07/29/2018 by [name], MD No      Gestational age appropriate obstetric precautions including but not limited to vaginal bleeding, contractions, leaking of fluid and fetal movement were reviewed in detail with the patient.    - [place] with gestational sac and yolk sac.  Recommend follow up ultrasound in 11 days - Will trend HCG in the meantime - NOB labs today  Return in 2 days (on 08/14/2018) for Labs visit 8/7, ROB and [place] 08/24/2018.  [name], MD, [facility], [facility] 08/12/2018, 12:48 PM

## 2018-08-12 NOTE — Progress Notes (Signed)
NOB Dating Ultrasound

## 2018-08-13 LAB — PROTEIN / CREATININE RATIO, URINE
Creatinine, Urine: 126.5 mg/dL
Protein, Ur: 27.3 mg/dL
Protein/Creat Ratio: 216 mg/g creat — ABNORMAL HIGH (ref 0–200)

## 2018-08-14 ENCOUNTER — Other Ambulatory Visit: Payer: Self-pay

## 2018-08-14 ENCOUNTER — Other Ambulatory Visit: Payer: 59

## 2018-08-14 DIAGNOSIS — O3680X0 Pregnancy with inconclusive fetal viability, not applicable or unspecified: Secondary | ICD-10-CM

## 2018-08-14 LAB — COMPREHENSIVE METABOLIC PANEL
ALT: 15 IU/L (ref 0–32)
AST: 17 IU/L (ref 0–40)
Albumin/Globulin Ratio: 1.9 (ref 1.2–2.2)
Albumin: 4.4 g/dL (ref 3.9–5.0)
Alkaline Phosphatase: 70 IU/L (ref 39–117)
BUN/Creatinine Ratio: 10 (ref 9–23)
BUN: 7 mg/dL (ref 6–20)
Bilirubin Total: 0.5 mg/dL (ref 0.0–1.2)
CO2: 20 mmol/L (ref 20–29)
Calcium: 8.9 mg/dL (ref 8.7–10.2)
Chloride: 102 mmol/L (ref 96–106)
Creatinine, Ser: 0.68 mg/dL (ref 0.57–1.00)
GFR calc Af Amer: 139 mL/min/{1.73_m2} (ref >59)
GFR calc non Af Amer: 120 mL/min/{1.73_m2} (ref >59)
Globulin, Total: 2.3 g/dL (ref 1.5–4.5)
Glucose: 82 mg/dL (ref 65–99)
Potassium: 3.8 mmol/L (ref 3.5–5.2)
Sodium: 138 mmol/L (ref 134–144)
Total Protein: 6.7 g/dL (ref 6.0–8.5)

## 2018-08-14 LAB — RPR+RH+ABO+RUB AB+AB SCR+CB...
Antibody Screen: NEGATIVE
HIV Screen 4th Generation wRfx: NONREACTIVE
Hematocrit: 37.9 % (ref 34.0–46.6)
Hemoglobin: 12.8 g/dL (ref 11.1–15.9)
Hepatitis B Surface Ag: NEGATIVE
MCH: 28.8 pg (ref 26.6–33.0)
MCHC: 33.8 g/dL (ref 31.5–35.7)
MCV: 85 fL (ref 79–97)
Platelets: 274 10*3/uL (ref 150–450)
RBC: 4.44 x10E6/uL (ref 3.77–5.28)
RDW: 12.4 % (ref 11.7–15.4)
RPR Ser Ql: NONREACTIVE
Rh Factor: POSITIVE
Rubella Antibodies, IGG: 5.49 index (ref >0.99)
Varicella zoster IgG: 623 index (ref >165)
WBC: 7.4 10*3/uL (ref 3.4–10.8)

## 2018-08-14 LAB — URINE CULTURE

## 2018-08-14 LAB — PROGESTERONE: Progesterone: 17.7 ng/mL

## 2018-08-14 LAB — BETA HCG QUANT (REF LAB): hCG Quant: 4490 m[IU]/mL

## 2018-08-15 LAB — BETA HCG QUANT (REF LAB): hCG Quant: 8410 m[IU]/mL

## 2018-08-17 LAB — GC/CHLAMYDIA PROBE AMP
Chlamydia trachomatis, NAA: NEGATIVE
Neisseria Gonorrhoeae by PCR: NEGATIVE

## 2018-08-24 ENCOUNTER — Other Ambulatory Visit: Payer: Self-pay

## 2018-08-24 ENCOUNTER — Ambulatory Visit (INDEPENDENT_AMBULATORY_CARE_PROVIDER_SITE_OTHER): Payer: 59 | Admitting: Obstetrics and Gynecology

## 2018-08-24 ENCOUNTER — Ambulatory Visit (INDEPENDENT_AMBULATORY_CARE_PROVIDER_SITE_OTHER): Payer: 59

## 2018-08-24 DIAGNOSIS — N8311 Corpus luteum cyst of right ovary: Secondary | ICD-10-CM | POA: Diagnosis not present

## 2018-08-24 DIAGNOSIS — Z3689 Encounter for other specified antenatal screening: Secondary | ICD-10-CM

## 2018-08-24 DIAGNOSIS — Z3480 Encounter for supervision of other normal pregnancy, unspecified trimester: Secondary | ICD-10-CM

## 2018-08-24 DIAGNOSIS — O09291 Supervision of pregnancy with other poor reproductive or obstetric history, first trimester: Secondary | ICD-10-CM

## 2018-08-24 DIAGNOSIS — O3680X0 Pregnancy with inconclusive fetal viability, not applicable or unspecified: Secondary | ICD-10-CM

## 2018-08-24 DIAGNOSIS — Z3A01 Less than 8 weeks gestation of pregnancy: Secondary | ICD-10-CM

## 2018-08-24 DIAGNOSIS — O34219 Maternal care for unspecified type scar from previous cesarean delivery: Secondary | ICD-10-CM

## 2018-08-24 DIAGNOSIS — O3481 Maternal care for other abnormalities of pelvic organs, first trimester: Secondary | ICD-10-CM

## 2018-08-24 NOTE — Progress Notes (Signed)
Routine Prenatal Care Visit  Subjective  Breanna Lamb Both is a 28 y.o. G3P2002 at [redacted]w[redacted]d being seen today for ongoing prenatal care.  She is currently monitored for the following issues for this high-risk pregnancy and has Premenstrual dysphoric syndrome; Chronic tension-type headache, not intractable; Hypoglycemia; Supervision of other normal pregnancy, antepartum; Maternal obesity, antepartum; History of cesarean section complicating pregnancy; History of gestational hypertension; and Genital herpes simplex virus (HSV) infection in mother affecting pregnancy on their problem list.  ----------------------------------------------------------------------------------- Patient reports no complaints.   Contractions: Not present. Vag. Bleeding: None.  Movement: Absent. Denies leaking of fluid.  ----------------------------------------------------------------------------------- The following portions of the patient's history were reviewed and updated as appropriate: allergies, current medications, past family history, past medical history, past social history, past surgical history and problem list. Problem list updated.   Objective  Blood pressure 108/68, weight 230 lb (104.3 kg), last menstrual period 06/26/2018. Pregravid weight 225 lb (102.1 kg) Total Weight Gain 5 lb (2.268 kg) Urinalysis:      Fetal Status:     Movement: Absent     General:  Alert, oriented and cooperative. Patient is in no acute distress.  Skin: Skin is warm and dry. No rash noted.   Cardiovascular: Normal heart rate noted  Respiratory: Normal respiratory effort, no problems with respiration noted  Abdomen: Soft, gravid, appropriate for gestational age. Pain/Pressure: Absent     Pelvic:  Cervical exam deferred        Extremities: Normal range of motion.     ental Status: Normal mood and affect. Normal behavior. Normal judgment and thought content.   US Ob Comp Less 14 Wks  Result Date: 08/24/2018 Patient Name:  Breanna Lamb DOB: 1990/02/14 MRN: [id number] ULTRASOUND REPORT Location: [facility] Date of Service: 08/24/2018 Indications:dating Findings: Nelda Marse[place] intrauterine pregnancy is visualized with a CRL consistent with [redacted]w[redacted]d gestation, giving an (U/S) EDD of 04/12/2019. The (U/S) EDD is not consistent with the clinically established EDD of 04/02/2019. FHR: 123 BPM CRL measurement: 9.7 mm Yolk sac is visualized and appears normal and early anatomy is normal. Amnion: visualized and appears normal Right Ovary is normal in appearance. Left Ovary is normal appearance. Corpus luteal cyst:  Right ovary Survey of the adnexa demonstrates no adnexal masses. There is no free peritoneal fluid in the cul de sac. Impression: 1. [redacted]w[redacted]d Viable Singleton Intrauterine pregnancy by U/S. 2. (U/S) EDD is not  consistent with Clinically established EDD of 04/02/2019. Recommendations: 1.Clinical correlation with the patient's History and Physical Exam. Gweneth Dimitri, RT There is a viable singleton gestation.  The fetal biometry does not correlate with established dating. Detailed evaluation of the fetal anatomy is precluded by early gestational age.  It must be noted that a normal ultrasound particular at this early gestational age is unable to rule out fetal aneuploidy, risk of first trimester miscarriage, or anatomic birth defects. Based on ultrasound examination today, EDC changed to 04/12/2019 [place] Criteria for Re-dating Pregnancy Dating Dating Discrepancy [redacted]w[redacted]d >5 days [redacted]w[redacted]d - [redacted]w[redacted]d >7 days [redacted]w[redacted]d - [redacted]w[redacted]d >7 days [redacted]w[redacted]d - [redacted]w[redacted]d >10 days [redacted]w[redacted]d - [redacted]w[redacted]d >14 days [redacted]w[redacted]d and above >21 days ACOG Committee Opinion 700 May 2017 "Methods for Estimating the Due Date" [name], MD, [facility], [facility] 08/24/2018, 10:06 AM   US Ob Comp Less 14 Wks  Result Date: 08/12/2018 Patient Name: Breanna Lamb DOB: 12-08-1990 MRN: [id number] ULTRASOUND REPORT Location: [facility] Date of Service: 08/12/2018  Indications:dating Findings: There is a  single gestational sac and yolk sac seen within the uterus. No fetal pole or heartbeat is seen at this time. Yolk sac is visualized and appears normal and early anatomy is normal. Amnion: is not visualized. Right Ovary is normal in appearance. Left Ovary is normal appearance. Corpus luteal cyst:  Right ovary Survey of the adnexa demonstrates no adnexal masses. There is no free peritoneal fluid in the cul de sac. Impression: There is a single gestational sac and yolk sac seen within the uterus. No fetal pole or heartbeat is seen at  this time. Recommendations: 1.Clinical correlation with the patient's History and Physical Exam. Gweneth Dimitri, RT There is a singleton gestation of undetermined viability.  The fetal biometry correlates with established dating. Detailed evaluation of the fetal anatomy is precluded by early gestational age.  It must be noted that a normal ultrasound particular at this early gestational age is unable to rule out fetal aneuploidy, risk of first trimester miscarriage, or anatomic birth defects. Recommend follow up ultrasound in 11 days from today.  "[place] Ultrasound Guidelines for Transvaginal Ultrasonographic Diagnosis of Early Pregnancy Loss" and adopted in [place] Number 150, May 2015 (reaffirmed 2017) "Early Pregnancy Loss" [name], MD, [facility], [facility] 08/12/2018, 10:18 AM     Assessment   28 y.o. [id number] at [redacted]w[redacted]d by  04/12/2019, by Ultrasound presenting for routine prenatal visit  Plan   Pregnancy#3 Problems (from 06/26/18 to present)    Problem Noted Resolved   Supervision of other normal pregnancy, antepartum 07/29/2018 by [name], MD No   Overview Addendum 08/25/2018  9:25 AM by [name], MD    Clinic [facility] Prenatal Labs  Dating 5 week [place] Blood type: A/Positive/-- (08/05 1047)   Genetic Screen 1 Screen:    AFP:     Quad:     NIPS:  Antibody:Negative (08/05 1047)  Anatomic [place]  Rubella: 5.49 (08/05 1047) Varicella: Immune  GTT Early:               Third trimester:  RPR: Non Reactive (08/05 1047)   Rhogam N/A HBsAg: Negative (08/05 1047)   TDaP vaccine                       Flu Shot: HIV: Non Reactive (08/05 1047)   Baby Food                                GBS:   Contraception  Pap: 04/13/2018 NIL  CBB     CS/VBAC ?   Support Person            Maternal obesity, antepartum 07/29/2018 by [name], MD No   History of cesarean section complicating pregnancy 5/88/5027 by [name], MD No   Overview Signed 08/25/2018  9:25 AM by [name], MD    Transverse lie G2      History of gestational hypertension 07/29/2018 by [name], MD No       Gestational age appropriate obstetric precautions including but not limited to vaginal bleeding, contractions, leaking of fluid and fetal movement were reviewed in detail with the patient.    Return in about 4 weeks (around 09/21/2018) for ROB.  [name], MD, [facility], [facility] 08/24/2018, 10:08 AM

## 2018-08-24 NOTE — Progress Notes (Signed)
ROB Dating scan

## 2018-09-09 ENCOUNTER — Other Ambulatory Visit: Payer: Self-pay | Admitting: Obstetrics and Gynecology

## 2018-09-09 MED ORDER — VALACYCLOVIR HCL 1 G PO TABS: 1000.0000 mg | ORAL_TABLET | Freq: Every day | ORAL | 6 refills | Status: AC

## 2018-09-17 ENCOUNTER — Ambulatory Visit (INDEPENDENT_AMBULATORY_CARE_PROVIDER_SITE_OTHER): Payer: 59 | Admitting: Advanced Practice Midwife

## 2018-09-17 ENCOUNTER — Other Ambulatory Visit: Payer: Self-pay

## 2018-09-17 ENCOUNTER — Encounter: Payer: Self-pay | Admitting: Advanced Practice Midwife

## 2018-09-17 VITALS — BP 118/74 | Wt 232.0 lb

## 2018-09-17 DIAGNOSIS — O99340 Other mental disorders complicating pregnancy, unspecified trimester: Secondary | ICD-10-CM

## 2018-09-17 DIAGNOSIS — F329 Major depressive disorder, single episode, unspecified: Secondary | ICD-10-CM

## 2018-09-17 DIAGNOSIS — O99341 Other mental disorders complicating pregnancy, first trimester: Secondary | ICD-10-CM

## 2018-09-17 DIAGNOSIS — Z3480 Encounter for supervision of other normal pregnancy, unspecified trimester: Secondary | ICD-10-CM | POA: Diagnosis not present

## 2018-09-17 DIAGNOSIS — O09291 Supervision of pregnancy with other poor reproductive or obstetric history, first trimester: Secondary | ICD-10-CM

## 2018-09-17 DIAGNOSIS — O34219 Maternal care for unspecified type scar from previous cesarean delivery: Secondary | ICD-10-CM

## 2018-09-17 DIAGNOSIS — Z3A10 10 weeks gestation of pregnancy: Secondary | ICD-10-CM

## 2018-09-17 MED ORDER — SERTRALINE HCL 50 MG PO TABS: 50.0000 mg | ORAL_TABLET | Freq: Every day | ORAL | 5 refills | Status: DC

## 2018-09-17 NOTE — Progress Notes (Signed)
No vb. No lof.  

## 2018-09-17 NOTE — Patient Instructions (Signed)

## 2018-09-17 NOTE — Progress Notes (Signed)
Routine Prenatal Care Visit  Subjective  Breanna Lamb is a 28 y.o. G3P2002 at [redacted]w[redacted]d being seen today for ongoing prenatal care.  She is currently monitored for the following issues for this low-risk pregnancy and has Premenstrual dysphoric syndrome; Chronic tension-type headache, not intractable; Hypoglycemia; Supervision of other normal pregnancy, antepartum; Maternal obesity, antepartum; History of cesarean section complicating pregnancy; History of gestational hypertension; and Genital herpes simplex virus (HSV) infection in mother affecting pregnancy on their problem list.  ----------------------------------------------------------------------------------- Patient reports has some tenderness when touching her abdomen. Discussed possible tense muscles and trying epsom salt soaks and OTC magnesium 250 mg. She has been off of Prozac since she found out she was pregnant. She would like Rx for Zoloft as her depression symptoms are worsening.   Contractions: Not present. Vag. Bleeding: None.   . Leaking Fluid denies.  ----------------------------------------------------------------------------------- The following portions of the patient's history were reviewed and updated as appropriate: allergies, current medications, past family history, past medical history, past social history, past surgical history and problem list. Problem list updated.  Objective  Blood pressure 118/74, weight 232 lb (105.2 kg), last menstrual period 06/26/2018. Pregravid weight 225 lb (102.1 kg) Total Weight Gain 7 lb (3.175 kg) Urinalysis: Urine Protein    Urine Glucose    Fetal Status: Fetal Heart Rate (bpm): 168         General:  Alert, oriented and cooperative. Patient is in no acute distress.  Skin: Skin is warm and dry. No rash noted.   Cardiovascular: Normal heart rate noted  Respiratory: Normal respiratory effort, no problems with respiration noted  Abdomen: Soft, gravid, appropriate for gestational  age. Pain/Pressure: Absent     Pelvic:  Cervical exam deferred        Extremities: Normal range of motion.     Mental Status: Normal mood and affect. Normal behavior. Normal judgment and thought content.   Assessment   28 y.o. G3P2002 at [redacted]w[redacted]d by  04/12/2019, by Ultrasound presenting for routine prenatal visit  Plan   Pregnancy#3 Problems (from 06/26/18 to present)    Problem Noted Resolved   Supervision of other normal pregnancy, antepartum 07/29/2018 by [name], MD No   Overview Addendum 08/25/2018  9:25 AM by [name], MD    Clinic [facility] Prenatal Labs  Dating 5 week [place] Blood type: A/Positive/-- (08/05 1047)   Genetic Screen 1 Screen:    AFP:     Quad:     NIPS: Antibody:Negative (08/05 1047)  Anatomic [place]  Rubella: 5.49 (08/05 1047) Varicella: Immune  GTT Early:               Third trimester:  RPR: Non Reactive (08/05 1047)   Rhogam N/A HBsAg: Negative (08/05 1047)   TDaP vaccine                       Flu Shot: HIV: Non Reactive (08/05 1047)   Baby Food                                GBS:   Contraception  Pap: 04/13/2018 NIL  CBB     CS/VBAC ?   Support Person            Maternal obesity, antepartum 07/29/2018 by [name], MD No   History of cesarean section complicating pregnancy [id number] by [name], MD No   Overview Signed 08/25/2018  9:25 AM by  [name], MD    Transverse lie G2      History of gestational hypertension 07/29/2018 by [name], MD No       Preterm labor symptoms and general obstetric precautions including but not limited to vaginal bleeding, contractions, leaking of fluid and fetal movement were reviewed in detail with the patient. Please refer to After Visit Summary for other counseling recommendations.  Rx zoloft 50 mg, increase dose as needed.  Return in about 4 weeks (around 10/15/2018) for rob.  Rod Can, CNM 09/17/2018 4:41 PM

## 2018-09-18 ENCOUNTER — Encounter: Payer: 59 | Admitting: Certified Nurse Midwife

## 2018-09-23 LAB — MATERNIT 21 PLUS CORE, BLOOD
Fetal Fraction: 5
Result (T21): NEGATIVE
Trisomy 13 (Patau syndrome): NEGATIVE
Trisomy 18 (Edwards syndrome): NEGATIVE
Trisomy 21 (Down syndrome): NEGATIVE

## 2018-09-25 ENCOUNTER — Encounter: Payer: 59 | Admitting: Advanced Practice Midwife

## 2018-10-01 ENCOUNTER — Emergency Department
Admission: EM | Admit: 2018-10-01 | Discharge: 2018-10-01 | Disposition: A | Discharge status: Home / Self Care | Payer: 59 | Attending: Emergency Medicine | Admitting: Emergency Medicine

## 2018-10-01 ENCOUNTER — Encounter: Payer: Self-pay | Admitting: Emergency Medicine

## 2018-10-01 ENCOUNTER — Other Ambulatory Visit: Payer: Self-pay

## 2018-10-01 ENCOUNTER — Telehealth: Payer: Self-pay | Admitting: Obstetrics and Gynecology

## 2018-10-01 ENCOUNTER — Emergency Department: Payer: 59

## 2018-10-01 DIAGNOSIS — Z3A12 12 weeks gestation of pregnancy: Secondary | ICD-10-CM | POA: Diagnosis not present

## 2018-10-01 DIAGNOSIS — R350 Frequency of micturition: Secondary | ICD-10-CM | POA: Insufficient documentation

## 2018-10-01 DIAGNOSIS — Z3492 Encounter for supervision of normal pregnancy, unspecified, second trimester: Secondary | ICD-10-CM

## 2018-10-01 DIAGNOSIS — Z79899 Other long term (current) drug therapy: Secondary | ICD-10-CM | POA: Diagnosis not present

## 2018-10-01 DIAGNOSIS — J45909 Unspecified asthma, uncomplicated: Secondary | ICD-10-CM | POA: Insufficient documentation

## 2018-10-01 DIAGNOSIS — R1030 Lower abdominal pain, unspecified: Secondary | ICD-10-CM | POA: Diagnosis not present

## 2018-10-01 DIAGNOSIS — O26891 Other specified pregnancy related conditions, first trimester: Secondary | ICD-10-CM | POA: Diagnosis not present

## 2018-10-01 DIAGNOSIS — O26851 Spotting complicating pregnancy, first trimester: Secondary | ICD-10-CM | POA: Diagnosis not present

## 2018-10-01 DIAGNOSIS — O99511 Diseases of the respiratory system complicating pregnancy, first trimester: Secondary | ICD-10-CM | POA: Insufficient documentation

## 2018-10-01 DIAGNOSIS — R1084 Generalized abdominal pain: Secondary | ICD-10-CM

## 2018-10-01 LAB — URINALYSIS, COMPLETE (UACMP) WITH MICROSCOPIC
Bacteria, UA: NONE SEEN
Bilirubin Urine: NEGATIVE
Glucose, UA: NEGATIVE mg/dL
Hgb urine dipstick: NEGATIVE
Ketones, ur: NEGATIVE mg/dL
Leukocytes,Ua: NEGATIVE
Nitrite: NEGATIVE
Protein, ur: NEGATIVE mg/dL
Specific Gravity, Urine: 1.017 (ref 1.005–1.030)
pH: 7 (ref 5.0–8.0)

## 2018-10-01 LAB — HCG, QUANTITATIVE, PREGNANCY: hCG, Beta Chain, Quant, S: 56503 m[IU]/mL — ABNORMAL HIGH (ref <5)

## 2018-10-01 LAB — INHERITEST CORE(CF97,SMA,FRAX)

## 2018-10-01 LAB — ABO/RH: ABO/RH(D): A POS

## 2018-10-01 MED ORDER — ONDANSETRON 4 MG PO TBDP: 4.0000 mg | ORAL_TABLET | Freq: Three times a day (TID) | ORAL | 0 refills | Status: DC | PRN

## 2018-10-01 NOTE — ED Provider Notes (Signed)
[facility] Emergency Department Provider Note  ____________________________________________  Time seen: Approximately 5:41 PM  I have reviewed the triage vital signs and the nursing notes.   HISTORY  Chief Complaint Threatened Miscarriage    HPI Breanna Lamb is a 28 y.o. female with a history of vertigo, ovarian cyst, currently about [redacted] weeks pregnant who complains of generalized abdominal cramping and a small amount of blood spotting after urinating today.  Not sure if she is having vaginal bleeding or if it is related to her urinary tract.  To her it feels like a UTI which she has had in the past.  Denies fevers chills sweats body aches.  No vomiting.  She sees [facility].  Symptoms are waxing and waning without aggravating or alleviating factors.  She does note urinary frequency lately.      Past Medical History:  Diagnosis Date  . Allergic rhinitis   . Asthma   . Benign positional vertigo   . Bunion   . Contraceptive management   . Depression   . Genital herpes   . Henoch-Schonlein purpura ([place])   . Lymphadenopathy   . Neck pain   . Obesity   . Ovarian cyst, right   . Poor concentration   . Premenstrual dysphoric syndrome   . Premenstrual dysphoric syndrome   . Screening for venereal disease   . Skin lesion      Patient Active Problem List   Diagnosis Date Noted  . Supervision of other normal pregnancy, antepartum 07/29/2018  . Maternal obesity, antepartum 07/29/2018  . History of cesarean section complicating pregnancy [id number]  . History of gestational hypertension 07/29/2018  . Genital herpes simplex virus (HSV) infection in mother affecting pregnancy 07/29/2018  . Hypoglycemia 02/24/2018  . Chronic tension-type headache, not intractable 01/31/2016  . Premenstrual dysphoric syndrome      Past Surgical History:  Procedure Laterality Date  . BUNIONECTOMY  2010 and 2011  . CESAREAN SECTION    . mirena IUD  01/2014  .  OVARIAN CYST REMOVAL  August 2014     Prior to Admission medications   Medication Sig Start Date End Date Taking? Authorizing Provider  albuterol (PROVENTIL HFA;VENTOLIN HFA) 108 (90 Base) MCG/ACT inhaler Inhale 2 puffs into the lungs every 4 (four) hours as needed for wheezing. 01/03/18   Marylene Land, NP  ondansetron (ZOFRAN ODT) 4 MG disintegrating tablet Take 1 tablet (4 mg total) by mouth every 8 (eight) hours as needed for nausea or vomiting. 10/01/18   Carrie Mew, MD  sertraline (ZOLOFT) 50 MG tablet Take 1 tablet (50 mg total) by mouth daily. 09/17/18   Rod Can, CNM     Allergies Nickel   Family History  Problem Relation Age of Onset  . Hypertension Mother   . Mental illness Mother        Depression, Anxiety  . Hypertension Father   . Hypertension Brother   . Hypertension Maternal Grandmother   . Diabetes Maternal Grandmother   . Hyperlipidemia Maternal Grandmother   . Heart disease Maternal Grandmother   . COPD Maternal Grandmother   . Kidney disease Maternal Grandmother   . Mental illness Maternal Grandmother   . Hypertension Sister   . Mental illness Sister   . Heart disease Maternal Grandfather   . Heart attack Maternal Grandfather   . Cancer Paternal Grandmother     Social History Social History   Tobacco Use  . Smoking status: Never Smoker  . Smokeless tobacco: Never Used  Substance Use Topics  . Alcohol use: No  . Drug use: No    Review of Systems  Constitutional:   No fever or chills.  ENT:   No sore throat. No rhinorrhea. Cardiovascular:   No chest pain or syncope. Respiratory:   No dyspnea or cough. Gastrointestinal:   Positive for generalized abdominal pain/cramping.  No vomiting diarrhea or constipation..  Musculoskeletal:   Negative for focal pain or swelling All other systems reviewed and are negative except as documented above in ROS and HPI.  ____________________________________________   PHYSICAL EXAM:  VITAL  SIGNS: ED Triage Vitals  Enc Vitals Group     BP 10/01/18 1612 (!) 131/54     Pulse Rate 10/01/18 1612 86     Resp 10/01/18 1612 16     Temp 10/01/18 1612 99 F (37.2 C)     Temp Source 10/01/18 1612 Oral     SpO2 10/01/18 1612 98 %     Weight 10/01/18 1613 228 lb (103.4 kg)     Height 10/01/18 1613 5\' 1"  (1.549 m)     Head Circumference --      Peak Flow --      Pain Score 10/01/18 1613 2     Pain Loc --      Pain Edu? --      Excl. in [place]? --     Vital signs reviewed, nursing assessments reviewed.   Constitutional:   Alert and oriented. Non-toxic appearance. Eyes:   Conjunctivae are normal. EOMI. PERRL. ENT      Head:   Normocephalic and atraumatic.      Nose:   Wearing a mask.      Mouth/Throat:   Wearing a mask.      Neck:   No meningismus. Full ROM. Hematological/Lymphatic/Immunilogical:   No cervical lymphadenopathy. Cardiovascular:   RRR. Symmetric bilateral radial and DP pulses.  No murmurs. Cap refill less than 2 seconds. Respiratory:   Normal respiratory effort without tachypnea/retractions. Breath sounds are clear and equal bilaterally. No wheezes/rales/rhonchi. Gastrointestinal:   Soft with mild generalized tenderness, more pronounced over the suprapubic area. Non distended, nongravid. There is no CVA tenderness.  No rebound, rigidity, or guarding.  Musculoskeletal:   Normal range of motion in all extremities. No joint effusions.  No lower extremity tenderness.  No edema. Neurologic:   Normal speech and language.  Motor grossly intact. No acute focal neurologic deficits are appreciated.  Skin:    Skin is warm, dry and intact. No rash noted.  No petechiae, purpura, or bullae.  ____________________________________________    LABS (pertinent positives/negatives) (all labs ordered are listed, but only abnormal results are displayed) Labs Reviewed  HCG, QUANTITATIVE, PREGNANCY - Abnormal; Notable for the following components:      Result Value   hCG, Beta  Chain, Quant, S 56,503 (*)    All other components within normal limits  URINALYSIS, COMPLETE (UACMP) WITH MICROSCOPIC - Abnormal; Notable for the following components:   Color, Urine YELLOW (*)    APPearance CLOUDY (*)    All other components within normal limits  URINE CULTURE  ABO/RH   ____________________________________________   EKG    ____________________________________________    RADIOLOGY  US Ob Comp < 14 Wks  Result Date: 10/01/2018 CLINICAL DATA:  Cramping, vaginal spotting EXAM: OBSTETRIC <14 WK ULTRASOUND TECHNIQUE: Transabdominal ultrasound was performed for evaluation of the gestation as well as the maternal uterus and adnexal regions. COMPARISON:  08/24/2018 FINDINGS: Intrauterine gestational sac: Single Yolk sac:  Not visualized Embryo:  Visualized Cardiac Activity: Visualized Heart Rate: 154 bpm MSD:    mm    w     d CRL:   62.4 mm   12 w 5 d                  [place] EDC: 04/10/2019 Subchorionic hemorrhage:  None visualized. Maternal uterus/adnexae: No adnexal mass or free fluid. IMPRESSION: Twelve week 5 day intrauterine pregnancy. Fetal heart rate 154 beats per minute. No acute maternal findings. Electronically Signed   By: Rolm Baptise M.D.   On: 10/01/2018 17:36    ____________________________________________   PROCEDURES Procedures  ____________________________________________  DIFFERENTIAL DIAGNOSIS   Cystitis, threatened miscarriage, viral syndrome  CLINICAL IMPRESSION / ASSESSMENT AND PLAN / ED COURSE  Medications ordered in the ED: Medications - No data to display  Pertinent labs & imaging results that were available during my care of the patient were reviewed by me and considered in my medical decision making (see chart for details).  Martin A Blankenship was evaluated in Emergency Department on 10/01/2018 for the symptoms described in the history of present illness. She was evaluated in the context of the global COVID-19 pandemic, which  necessitated consideration that the patient might be at risk for infection with the SARS-CoV-2 virus that causes COVID-19. Institutional protocols and algorithms that pertain to the evaluation of patients at risk for COVID-19 are in a state of rapid change based on information released by regulatory bodies including the CDC and federal and state organizations. These policies and algorithms were followed during the patient's care in the ED.   Patient presents with crampy abdominal pain and possible vaginal spotting that started today.  Presentation is not very consistent with threatened miscarriage although possible.  More likely cystitis versus viral syndrome.  Vital signs are normal, exam overall is reassuring.  Blood type is a positive, hCG is 56,500 which seems appropriate to her gestational age of roughly 62 weeks.  She has a previous ultrasound demonstrating an IUP, but I offered ultrasound today as well which she he was eager for.  Will obtain this, check labs, urinalysis.  Counseled her on threatened miscarriage and unfortunately no way to know what will happen in the for the next few days, that the fetus is currently previable without any available interventions to prevent miscarriage.  Highly doubt STI PID or TOA or torsion.  Doubt appendicitis biliary disease pancreatitis bowel obstruction perforation.Marland Kitchen   ----------------------------------------- 7:07 PM on 10/01/2018 -----------------------------------------  Pelvic ultrasound appears normal.  Urinalysis is negative, hCG is appropriate to gestational age.  Rh+.  Patient is very well-appearing, nontoxic.  Will do watchful waiting, symptom monitoring over the next few days.  Zofran to take as needed, follow-up with obstetrics on Monday in 4 days for continued monitoring.     ____________________________________________   FINAL CLINICAL IMPRESSION(S) / ED DIAGNOSES    Final diagnoses:  Generalized abdominal pain  Second trimester  pregnancy     ED Discharge Orders         Ordered    ondansetron (ZOFRAN ODT) 4 MG disintegrating tablet  Every 8 hours PRN     10/01/18 1905          Portions of this note were generated with dragon dictation software. Dictation errors may occur despite best attempts at proofreading.   Carrie Mew, MD 10/01/18 Einar Crow

## 2018-10-01 NOTE — Telephone Encounter (Signed)
Patient is calling with vaginal bleeding from when she wiped from using the restroom. Attempt to schedule appointment with [facility] tomorrow at 3 :30 patient is wanting to know if she needs to wait that long or if she need to go to the ER. Please advise

## 2018-10-01 NOTE — Telephone Encounter (Signed)
Pt having a lot of cramping and some bleeding when she used the bathroom. I advised pt some spotting in first trimester can be normal, but if she is having a lot of cramping she should go be evaluated at [facility]. Pt going to ER now and will call back and let me know what they say or do because they will more than likely have her follow up with [place].

## 2018-10-01 NOTE — Discharge Instructions (Signed)
Your urine test and pelvic ultrasound were okay today.  HCG level was 56,500.

## 2018-10-01 NOTE — ED Triage Notes (Signed)
Pt in via POV, reports abdominal cramping and spotting x one day.  NAD noted at this time.

## 2018-10-02 LAB — URINE CULTURE

## 2018-10-06 ENCOUNTER — Ambulatory Visit: Payer: 59

## 2018-10-06 ENCOUNTER — Other Ambulatory Visit: Payer: Self-pay | Admitting: Obstetrics and Gynecology

## 2018-10-06 ENCOUNTER — Other Ambulatory Visit: Payer: Self-pay

## 2018-10-06 DIAGNOSIS — O26899 Other specified pregnancy related conditions, unspecified trimester: Secondary | ICD-10-CM | POA: Diagnosis not present

## 2018-10-06 DIAGNOSIS — R109 Unspecified abdominal pain: Secondary | ICD-10-CM | POA: Diagnosis not present

## 2018-10-06 NOTE — Progress Notes (Signed)
Pt dropped off urine - front desk states they gave pt wipes for a clean catch which was drawn up to send to lab for culture.

## 2018-10-06 NOTE — Telephone Encounter (Signed)
I put order in for urine culture if she want to come by and drop one off

## 2018-10-06 NOTE — Addendum Note (Signed)
Addended by: [place], Deara Bober B on: 10/06/2018 03:03 PM   Modules accepted: Orders

## 2018-10-08 LAB — URINE CULTURE

## 2018-10-16 ENCOUNTER — Other Ambulatory Visit: Payer: Self-pay

## 2018-10-16 ENCOUNTER — Ambulatory Visit (INDEPENDENT_AMBULATORY_CARE_PROVIDER_SITE_OTHER): Payer: 59 | Admitting: Obstetrics and Gynecology

## 2018-10-16 VITALS — BP 122/62 | Wt 233.0 lb

## 2018-10-16 DIAGNOSIS — Z3A14 14 weeks gestation of pregnancy: Secondary | ICD-10-CM

## 2018-10-16 DIAGNOSIS — Z3480 Encounter for supervision of other normal pregnancy, unspecified trimester: Secondary | ICD-10-CM

## 2018-10-16 DIAGNOSIS — Z363 Encounter for antenatal screening for malformations: Secondary | ICD-10-CM

## 2018-10-16 DIAGNOSIS — O99210 Obesity complicating pregnancy, unspecified trimester: Secondary | ICD-10-CM

## 2018-10-16 DIAGNOSIS — O34219 Maternal care for unspecified type scar from previous cesarean delivery: Secondary | ICD-10-CM

## 2018-10-16 DIAGNOSIS — O99212 Obesity complicating pregnancy, second trimester: Secondary | ICD-10-CM

## 2018-10-16 LAB — POCT URINALYSIS DIPSTICK OB
Glucose, UA: NEGATIVE
POC,PROTEIN,UA: NEGATIVE

## 2018-10-16 NOTE — Progress Notes (Signed)
Routine Prenatal Care Visit  Subjective  Breanna Lamb is a 28 y.o. G3P2002 at [redacted]w[redacted]d being seen today for ongoing prenatal care.  She is currently monitored for the following issues for this high-risk pregnancy and has Premenstrual dysphoric syndrome; Chronic tension-type headache, not intractable; Hypoglycemia; Supervision of other normal pregnancy, antepartum; Maternal obesity, antepartum; History of cesarean section complicating pregnancy; History of gestational hypertension; and Genital herpes simplex virus (HSV) infection in mother affecting pregnancy on their problem list.  ----------------------------------------------------------------------------------- Patient reports no complaints.   Contractions: Not present. Vag. Bleeding: None.  Movement: Absent. Denies leaking of fluid.  ----------------------------------------------------------------------------------- The following portions of the patient's history were reviewed and updated as appropriate: allergies, current medications, past family history, past medical history, past social history, past surgical history and problem list. Problem list updated.   Objective  Blood pressure 122/62, weight 233 lb (105.7 kg), last menstrual period 06/26/2018. Pregravid weight 225 lb (102.1 kg) Total Weight Gain 8 lb (3.629 kg) Urinalysis:      Fetal Status: Fetal Heart Rate (bpm): 160   Movement: Absent     General:  Alert, oriented and cooperative. Patient is in no acute distress.  Skin: Skin is warm and dry. No rash noted.   Cardiovascular: Normal heart rate noted  Respiratory: Normal respiratory effort, no problems with respiration noted  Abdomen: Soft, gravid, appropriate for gestational age. Pain/Pressure: Absent     Pelvic:  Cervical exam deferred        Extremities: Normal range of motion.     ental Status: Normal mood and affect. Normal behavior. Normal judgment and thought content.     Assessment   28 y.o. [id number] at  [redacted]w[redacted]d by  04/12/2019, by Ultrasound presenting for routine prenatal visit  Plan   Pregnancy#3 Problems (from 06/26/18 to present)    Problem Noted Resolved   Supervision of other normal pregnancy, antepartum 07/29/2018 by [name], MD No   Overview Addendum 08/25/2018  9:25 AM by [name], MD    [facility]inic [facility] Prenatal Labs  Dating 5 week [place] Blood type: A/Positive/-- (08/05 1047)   Genetic Screen 1 Screen:    AFP:     Quad:     NIPS: Antibody:Negative (08/05 1047)  Anatomic [place]  Rubella: 5.49 (08/05 1047) Varicella: Immune  GTT Early:               Third trimester:  RPR: Non Reactive (08/05 1047)   Rhogam N/A HBsAg: Negative (08/05 1047)   TDaP vaccine                       Flu Shot: HIV: Non Reactive (08/05 1047)   Baby Food                                GBS:   Contraception  Pap: 04/13/2018 NIL  CBB     CS/VBAC ?   Support Person            Maternal obesity, antepartum 07/29/2018 by [name], MD No   History of cesarean section complicating pregnancy [id number] by [name], MD No   Overview Signed 08/25/2018  9:25 AM by [name], MD    Transverse lie G2      History of gestational hypertension 07/29/2018 by [name], MD No       Gestational age appropriate obstetric precautions including but not limited to vaginal bleeding, contractions,  leaking of fluid and fetal movement were reviewed in detail with the patient.    Return in about 1 week (around 10/23/2018) for 1 week 1-hr OGTT, 4 weeks ROB and anatomy scan.  [name], MD, [facility], [facility] 10/26/2018, 9:02 PM

## 2018-10-16 NOTE — Progress Notes (Signed)
ROB

## 2018-10-22 ENCOUNTER — Other Ambulatory Visit: Payer: Self-pay

## 2018-10-22 ENCOUNTER — Encounter: Payer: Self-pay | Admitting: Family Medicine

## 2018-10-22 ENCOUNTER — Ambulatory Visit (INDEPENDENT_AMBULATORY_CARE_PROVIDER_SITE_OTHER): Payer: 59 | Admitting: Family Medicine

## 2018-10-22 VITALS — Temp 98.7°F

## 2018-10-22 DIAGNOSIS — J029 Acute pharyngitis, unspecified: Secondary | ICD-10-CM | POA: Diagnosis not present

## 2018-10-22 MED ORDER — AMOXICILLIN 875 MG PO TABS: 875.0000 mg | ORAL_TABLET | Freq: Two times a day (BID) | ORAL | 0 refills | Status: DC

## 2018-10-22 NOTE — Progress Notes (Signed)
Temp 98.7 F (37.1 C)   LMP 06/26/2018 (Exact Date)    Subjective:    Patient ID: Breanna Lamb, female    DOB: 1990-03-11, 28 y.o.   MRN: [id number]  HPI: Breanna Lamb is a 28 y.o. female  Chief Complaint  Patient presents with  . Sore Throat    . This visit was completed via WebEx due to the restrictions of the COVID-19 pandemic. All issues as above were discussed and addressed. Physical exam was done as above through visual confirmation on WebEx. If it was felt that the patient should be evaluated in the office, they were directed there. The patient verbally consented to this visit. . Location of the patient: home . Location of the provider: work . Those involved with this call:  . Provider: Merrie Roof, PA-C . CMA: Tiffany Reel, CMA . Front Desk/Registration: Jill Side  . Time spent on call: 15 minutes with patient face to face via video conference. More than 50% of this time was spent in counseling and coordination of care. 5 minutes total spent in review of patient's record and preparation of their chart. I verified patient identity using two factors (patient name and date of birth). Patient consents verbally to being seen via telemedicine visit today.   Left sided lymphadenopathy, ttp, and sore swollen throat x 1 day. Denies fever, chills, sweats, N/V/D, cough, SOB. Tends to get strep infections often . Both of her daughters are also sick with similar sxs and being treated for strep.   Relevant past medical, surgical, family and social history reviewed and updated as indicated. Interim medical history since our last visit reviewed. Allergies and medications reviewed and updated.  Review of Systems  Per HPI unless specifically indicated above     Objective:    Temp 98.7 F (37.1 C)   LMP 06/26/2018 (Exact Date)   Wt Readings from Last 3 Encounters:  10/16/18 233 lb (105.7 kg)  10/01/18 228 lb (103.4 kg)  09/17/18 232 lb (105.2 kg)    Physical  Exam Vitals signs and nursing note reviewed.  Constitutional:      General: She is not in acute distress.    Appearance: Normal appearance.  HENT:     Head: Atraumatic.     Right Ear: External ear normal.     Left Ear: External ear normal.     Nose: Nose normal. No congestion.     Mouth/Throat:     Mouth: Mucous membranes are moist.     Pharynx: Posterior oropharyngeal erythema present.  Eyes:     Extraocular Movements: Extraocular movements intact.     Conjunctiva/sclera: Conjunctivae normal.  Neck:     Musculoskeletal: Normal range of motion.  Cardiovascular:     Comments: Unable to assess via virtual visit Pulmonary:     Effort: Pulmonary effort is normal. No respiratory distress.  Musculoskeletal: Normal range of motion.  Skin:    General: Skin is dry.     Findings: No erythema.  Neurological:     Mental Status: She is alert and oriented to person, place, and time.  Psychiatric:        Mood and Affect: Mood normal.        Thought Content: Thought content normal.        Judgment: Judgment normal.    Results for orders placed or performed in visit on 10/16/18  POC Urinalysis Dipstick OB  Result Value Ref Range   Color, UA     Clarity, UA  Glucose, UA Negative Negative   Bilirubin, UA     Ketones, UA     Spec Grav, UA     Blood, UA     pH, UA     POC,PROTEIN,UA Negative Negative, Trace, Small (1+), Moderate (2+), Large (3+), 4+   Urobilinogen, UA     Nitrite, UA     Leukocytes, UA     Appearance     Odor        Assessment & Plan:   Problem List Items Addressed This Visit    None    Visit Diagnoses    Pharyngitis, unspecified etiology    -  Primary   Sxs and hx consistent. Will tx for strep, f/u in person if not better after medications       Follow up plan: Return if symptoms worsen or fail to improve.

## 2018-10-30 ENCOUNTER — Other Ambulatory Visit: Payer: Self-pay

## 2018-10-30 ENCOUNTER — Other Ambulatory Visit: Payer: 59

## 2018-10-30 DIAGNOSIS — Z3480 Encounter for supervision of other normal pregnancy, unspecified trimester: Secondary | ICD-10-CM | POA: Diagnosis not present

## 2018-10-30 DIAGNOSIS — Z363 Encounter for antenatal screening for malformations: Secondary | ICD-10-CM

## 2018-10-30 DIAGNOSIS — O99210 Obesity complicating pregnancy, unspecified trimester: Secondary | ICD-10-CM

## 2018-10-31 LAB — GLUCOSE, 1 HOUR GESTATIONAL: Gestational Diabetes Screen: 99 mg/dL (ref 65–139)

## 2018-11-16 ENCOUNTER — Ambulatory Visit (INDEPENDENT_AMBULATORY_CARE_PROVIDER_SITE_OTHER): Payer: 59 | Admitting: Obstetrics and Gynecology

## 2018-11-16 ENCOUNTER — Ambulatory Visit (INDEPENDENT_AMBULATORY_CARE_PROVIDER_SITE_OTHER): Payer: 59

## 2018-11-16 ENCOUNTER — Other Ambulatory Visit: Payer: Self-pay

## 2018-11-16 VITALS — BP 120/82 | Wt 234.0 lb

## 2018-11-16 DIAGNOSIS — Z363 Encounter for antenatal screening for malformations: Secondary | ICD-10-CM

## 2018-11-16 DIAGNOSIS — O99212 Obesity complicating pregnancy, second trimester: Secondary | ICD-10-CM

## 2018-11-16 DIAGNOSIS — O4440 Low lying placenta NOS or without hemorrhage, unspecified trimester: Secondary | ICD-10-CM

## 2018-11-16 DIAGNOSIS — O34219 Maternal care for unspecified type scar from previous cesarean delivery: Secondary | ICD-10-CM

## 2018-11-16 DIAGNOSIS — O98319 Other infections with a predominantly sexual mode of transmission complicating pregnancy, unspecified trimester: Secondary | ICD-10-CM

## 2018-11-16 DIAGNOSIS — Z8759 Personal history of other complications of pregnancy, childbirth and the puerperium: Secondary | ICD-10-CM

## 2018-11-16 DIAGNOSIS — Z3480 Encounter for supervision of other normal pregnancy, unspecified trimester: Secondary | ICD-10-CM

## 2018-11-16 DIAGNOSIS — O99210 Obesity complicating pregnancy, unspecified trimester: Secondary | ICD-10-CM

## 2018-11-16 DIAGNOSIS — Z0489 Encounter for examination and observation for other specified reasons: Secondary | ICD-10-CM

## 2018-11-16 DIAGNOSIS — O98312 Other infections with a predominantly sexual mode of transmission complicating pregnancy, second trimester: Secondary | ICD-10-CM

## 2018-11-16 DIAGNOSIS — O09292 Supervision of pregnancy with other poor reproductive or obstetric history, second trimester: Secondary | ICD-10-CM

## 2018-11-16 DIAGNOSIS — Z3A19 19 weeks gestation of pregnancy: Secondary | ICD-10-CM

## 2018-11-16 DIAGNOSIS — IMO0002 Reserved for concepts with insufficient information to code with codable children: Secondary | ICD-10-CM

## 2018-11-16 DIAGNOSIS — A6009 Herpesviral infection of other urogenital tract: Secondary | ICD-10-CM

## 2018-11-16 DIAGNOSIS — O4442 Low lying placenta NOS or without hemorrhage, second trimester: Secondary | ICD-10-CM

## 2018-11-16 NOTE — Progress Notes (Signed)
ROB  Anatomy scan/ It is a BOY!! 

## 2018-11-19 DIAGNOSIS — O4440 Low lying placenta NOS or without hemorrhage, unspecified trimester: Secondary | ICD-10-CM

## 2018-11-19 HISTORY — DX: Low lying placenta nos or without hemorrhage, unspecified trimester: O44.40

## 2018-11-19 NOTE — Progress Notes (Signed)
Routine Prenatal Care Visit  Subjective  Breanna Lamb is a 28 y.o. G3P2002 at [redacted]w[redacted]d being seen today for ongoing prenatal care.  She is currently monitored for the following issues for this high-risk pregnancy and has Premenstrual dysphoric syndrome; Chronic tension-type headache, not intractable; Hypoglycemia; Supervision of other normal pregnancy, antepartum; Maternal obesity, antepartum; History of cesarean section complicating pregnancy; History of gestational hypertension; Genital herpes simplex virus (HSV) infection in mother affecting pregnancy; and Low-lying placenta on their problem list.  ----------------------------------------------------------------------------------- Patient reports no complaints.   Contractions: Not present. Vag. Bleeding: None.  Movement: Present. Denies leaking of fluid.  ----------------------------------------------------------------------------------- The following portions of the patient's history were reviewed and updated as appropriate: allergies, current medications, past family history, past medical history, past social history, past surgical history and problem list. Problem list updated.   Objective  Blood pressure 120/82, weight 234 lb (106.1 kg), last menstrual period 06/26/2018. Pregravid weight 225 lb (102.1 kg) Total Weight Gain 9 lb (4.082 kg) Urinalysis:      Fetal Status: Fetal Heart Rate (bpm): 145   Movement: Present     General:  Alert, oriented and cooperative. Patient is in no acute distress.  Skin: Skin is warm and dry. No rash noted.   Cardiovascular: Normal heart rate noted  Respiratory: Normal respiratory effort, no problems with respiration noted  Abdomen: Soft, gravid, appropriate for gestational age. Pain/Pressure: Absent     Pelvic:  Cervical exam deferred        Extremities: Normal range of motion.     ental Status: Normal mood and affect. Normal behavior. Normal judgment and thought content.   US Ob Comp + 14  Wk  Result Date: 11/16/2018 Patient Name: Breanna Lamb DOB: [birth year] MRN: [id number] ULTRASOUND REPORT Location: [facility] Date of Service: 11/16/2018 Indications:Anatomy Ultrasound Findings: Nelda Marseille intrauterine pregnancy is visualized with FHR at 145 BPM. Biometrics give an (U/S) Gestational age of [redacted]w[redacted]d and an (U/S) EDD of 04/09/2019; this correlates with the clinically established Estimated Date of Delivery: 04/12/19 Fetal presentation is Breech. EFW: 290 g ( 10 oz ). Placenta: posterior. Grade: 0 AFI: subjectively normal. Anatomic survey is incomplete for nose/lips, 4CH, RVOT, 3VV, DA, bilateral kidneys(transverse), bilateral foot position , placenta edge to internal cervical os (was measured at 1.7 cm away) and normal; Gender - female.  Impression: 1. [redacted]w[redacted]d Viable Singleton Intrauterine pregnancy by U/S. 2. (U/S) EDD is consistent with Clinically established Estimated Date of Delivery: 04/12/19 . 3. The anatomy scan is incomplete for the structures mentioned above. Recommendations: 1.Clinical correlation with the patient's History and Physical Exam. Gweneth Dimitri, RT  There is a singleton gestation with subjectively normal amniotic fluid volume. The fetal biometry correlates with established dating. Detailed evaluation of the fetal anatomy was performed.The fetal anatomical survey appears within normal limits within the resolution of ultrasound as described above, with above view incomplete.  The placenta is low lying and should be followed up at 28 weeks.  It must be noted that a normal ultrasound is unable to rule out fetal aneuploidy, subtle defects such as small ASD or VDS may also not be visible on imaging.  [name], MD, [facility], [facility] 11/16/2018, 4:26 PM     Assessment   28 y.o. [id number] at [redacted]w[redacted]d by  04/12/2019, by Ultrasound presenting for routine prenatal visit  Plan   Pregnancy#3 Problems (from 06/26/18 to present)    Problem Noted  Resolved   Supervision of other normal pregnancy,  antepartum 07/29/2018 by [name], MD No   Overview Addendum 10/31/2018  9:10 AM by [name], MD    [facility]inic [facility] Prenatal Labs  Dating 5 week [place] Blood type: A/Positive/-- (08/05 1047)   Genetic Screen Inheritest:SMA neg, CF neg, Fragile-X neg  NIPS: XY Antibody:Negative (08/05 1047)  Anatomic [place]  Rubella: 5.49 (08/05 1047) Varicella: Immune  GTT Early:99 Third trimester:  RPR: Non Reactive (08/05 1047)   Rhogam N/A HBsAg: Negative (08/05 1047)   TDaP vaccine                       Flu Shot: HIV: Non Reactive (08/05 1047)   Baby Food                                GBS:   Contraception  Pap: 04/13/2018 NIL  CBB     CS/VBAC ?   Support Person            Maternal obesity, antepartum 07/29/2018 by [name], MD No   History of cesarean section complicating pregnancy [id number] by [name], MD No   Overview Signed 08/25/2018  9:25 AM by [name], MD    Transverse lie G2      History of gestational hypertension 07/29/2018 by [name], MD No       Gestational age appropriate obstetric precautions including but not limited to vaginal bleeding, contractions, leaking of fluid and fetal movement were reviewed in detail with the patient.    - anatomy scan incomplete, follow up in 4 weeks for missing views and completion  Return in about 4 weeks (around 12/14/2018) for ROB and follow up anatomy scan.  [name], MD, [facility], [place]

## 2018-11-20 ENCOUNTER — Ambulatory Visit: Payer: Self-pay

## 2018-11-23 ENCOUNTER — Ambulatory Visit: Payer: Self-pay

## 2018-11-25 ENCOUNTER — Ambulatory Visit (INDEPENDENT_AMBULATORY_CARE_PROVIDER_SITE_OTHER): Payer: 59

## 2018-11-25 ENCOUNTER — Other Ambulatory Visit: Payer: Self-pay

## 2018-11-25 DIAGNOSIS — Z23 Encounter for immunization: Secondary | ICD-10-CM

## 2018-12-14 ENCOUNTER — Other Ambulatory Visit: Payer: Self-pay

## 2018-12-14 ENCOUNTER — Ambulatory Visit (INDEPENDENT_AMBULATORY_CARE_PROVIDER_SITE_OTHER): Payer: 59 | Admitting: Obstetrics and Gynecology

## 2018-12-14 ENCOUNTER — Ambulatory Visit (INDEPENDENT_AMBULATORY_CARE_PROVIDER_SITE_OTHER): Payer: 59

## 2018-12-14 VITALS — BP 124/86 | Wt 237.0 lb

## 2018-12-14 DIAGNOSIS — IMO0002 Reserved for concepts with insufficient information to code with codable children: Secondary | ICD-10-CM

## 2018-12-14 DIAGNOSIS — O99212 Obesity complicating pregnancy, second trimester: Secondary | ICD-10-CM

## 2018-12-14 DIAGNOSIS — O4442 Low lying placenta NOS or without hemorrhage, second trimester: Secondary | ICD-10-CM

## 2018-12-14 DIAGNOSIS — Z3480 Encounter for supervision of other normal pregnancy, unspecified trimester: Secondary | ICD-10-CM

## 2018-12-14 DIAGNOSIS — Z362 Encounter for other antenatal screening follow-up: Secondary | ICD-10-CM | POA: Diagnosis not present

## 2018-12-14 DIAGNOSIS — O99210 Obesity complicating pregnancy, unspecified trimester: Secondary | ICD-10-CM

## 2018-12-14 DIAGNOSIS — Z3A23 23 weeks gestation of pregnancy: Secondary | ICD-10-CM

## 2018-12-14 DIAGNOSIS — Z8759 Personal history of other complications of pregnancy, childbirth and the puerperium: Secondary | ICD-10-CM

## 2018-12-14 DIAGNOSIS — O09292 Supervision of pregnancy with other poor reproductive or obstetric history, second trimester: Secondary | ICD-10-CM

## 2018-12-14 DIAGNOSIS — O34219 Maternal care for unspecified type scar from previous cesarean delivery: Secondary | ICD-10-CM

## 2018-12-14 DIAGNOSIS — Z0489 Encounter for examination and observation for other specified reasons: Secondary | ICD-10-CM

## 2018-12-14 DIAGNOSIS — O4440 Low lying placenta NOS or without hemorrhage, unspecified trimester: Secondary | ICD-10-CM

## 2018-12-14 LAB — POCT URINALYSIS DIPSTICK OB
Glucose, UA: NEGATIVE
POC,PROTEIN,UA: NEGATIVE

## 2018-12-14 NOTE — Progress Notes (Signed)
ROB Anatomy scan follow up

## 2018-12-14 NOTE — Progress Notes (Signed)
Routine Prenatal Care Visit  Subjective  Breanna Lamb is a 28 y.o. G3P2002 at [redacted]w[redacted]d being seen today for ongoing prenatal care.  She is currently monitored for the following issues for this low-risk pregnancy and has Premenstrual dysphoric syndrome; Chronic tension-type headache, not intractable; Hypoglycemia; Supervision of other normal pregnancy, antepartum; Maternal obesity, antepartum; History of cesarean section complicating pregnancy; History of gestational hypertension; Genital herpes simplex virus (HSV) infection in mother affecting pregnancy; and Low-lying placenta on their problem list.  ----------------------------------------------------------------------------------- Patient reports no complaints.   Contractions: Not present. Vag. Bleeding: None.  Movement: Present. Denies leaking of fluid.  ----------------------------------------------------------------------------------- The following portions of the patient's history were reviewed and updated as appropriate: allergies, current medications, past family history, past medical history, past social history, past surgical history and problem list. Problem list updated.   Objective  Blood pressure 124/86, weight 237 lb (107.5 kg), last menstrual period 06/26/2018. Pregravid weight 225 lb (102.1 kg) Total Weight Gain 12 lb (5.443 kg)  Body mass index is 44.78 kg/m.  Urinalysis:      Fetal Status: Fetal Heart Rate (bpm): 140 Fundal Height: 24 cm Movement: Present     General:  Alert, oriented and cooperative. Patient is in no acute distress.  Skin: Skin is warm and dry. No rash noted.   Cardiovascular: Normal heart rate noted  Respiratory: Normal respiratory effort, no problems with respiration noted  Abdomen: Soft, gravid, appropriate for gestational age. Pain/Pressure: Absent     Pelvic:  Cervical exam deferred        Extremities: Normal range of motion.     ental Status: Normal mood and affect. Normal behavior.  Normal judgment and thought content.   US Transvaginal Non-OB  Result Date: 12/14/2018 Patient Name: NORVELLE KULAGA DOB: March 20, 1990 MRN: [id number] ULTRASOUND REPORT Location: [facility] Date of Service: 12/14/2018 Indications: Anatomy follow up ultrasound Findings: Breanna Lamb intrauterine pregnancy is visualized with FHR at 146 BPM. Fetal presentation is Breech. Placenta: posterior. Grade: 1 AFI: subjectively normal. Anatomic survey is complete. The nose/lips, 4CH, RVOT, 3VV, DA, kidneys, heel x 2, placental edge were seen today and appear normal. There is no free peritoneal fluid in the cul de sac. Impression: 1. [redacted]w[redacted]d Viable Singleton Intrauterine pregnancy previously established criteria. 2. Normal Anatomy Scan is now complete. 3. The placental edge is 8 cm away form the internal os of the cervix. Recommendations: 1.Clinical correlation with the patient's History and Physical Exam. Gweneth Dimitri, RT There is a singleton gestation with subjectively normal amniotic fluid volume.  Limited evaluation of the fetal anatomy was performed today, focusing on on anatomic structures not fully visualized at the time of prior study.The visualized fetal anatomical survey appears within normal limits within the resolution of ultrasound as described above, and the anatomic survey is now complete.  It must be noted that a normal ultrasound is unable to rule out fetal aneuploidy, subtle defects such as small ASD or VDS may also not be visible on imaging. [name], MD, [facility], [facility] 12/14/2018, 4:29 PM     Assessment   28 y.o. G3P2002 at [redacted]w[redacted]d by  04/12/2019, by Ultrasound presenting for routine prenatal visit  Plan   Pregnancy#3 Problems (from 06/26/18 to present)    Problem Noted Resolved   Low-lying placenta 11/19/2018 by [name], MD No   Supervision of other normal pregnancy, antepartum 07/29/2018 by [name], MD No   Overview Addendum  10/31/2018  9:10 AM by [name],  MD    [facility] Prenatal Labs  Dating 5 week [place] Blood type: A/Positive/-- (08/05 1047)   Genetic Screen Inheritest:SMA neg, CF neg, Fragile-X neg  NIPS: XY Antibody:Negative (08/05 1047)  Anatomic [place] Normal completed today Rubella: 5.49 (08/05 1047) Varicella: Immune  GTT Early:99 Third trimester:  RPR: Non Reactive (08/05 1047)   Rhogam N/A HBsAg: Negative (08/05 1047)   TDaP vaccine                       Flu Shot: HIV: Non Reactive (08/05 1047)   Baby Food                                GBS:   Contraception  Pap: 04/13/2018 NIL  CBB     CS/VBAC ?   Support Person            Maternal obesity, antepartum 07/29/2018 by [name], MD No   History of cesarean section complicating pregnancy [id number] by [name], MD No   Overview Signed 08/25/2018  9:25 AM by [name], MD    Transverse lie G2      History of gestational hypertension 07/29/2018 by [name], MD No       Gestational age appropriate obstetric precautions including but not limited to vaginal bleeding, contractions, leaking of fluid and fetal movement were reviewed in detail with the patient.    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    Return in about 4 weeks (around 01/11/2019) for ROB and 28 week labs, growth scan.  [name], MD, [facility], [facility] 12/14/2018, 4:29 PM

## 2019-01-09 ENCOUNTER — Observation Stay
Admission: EM | Admit: 2019-01-09 | Discharge: 2019-01-09 | Disposition: A | Discharge status: Home / Self Care | Payer: No Typology Code available for payment source | Attending: Obstetrics and Gynecology | Admitting: Obstetrics and Gynecology

## 2019-01-09 ENCOUNTER — Encounter: Payer: Self-pay | Admitting: Obstetrics and Gynecology

## 2019-01-09 ENCOUNTER — Other Ambulatory Visit: Payer: Self-pay

## 2019-01-09 DIAGNOSIS — R030 Elevated blood-pressure reading, without diagnosis of hypertension: Secondary | ICD-10-CM | POA: Diagnosis not present

## 2019-01-09 DIAGNOSIS — O4440 Low lying placenta NOS or without hemorrhage, unspecified trimester: Secondary | ICD-10-CM

## 2019-01-09 DIAGNOSIS — Z8759 Personal history of other complications of pregnancy, childbirth and the puerperium: Secondary | ICD-10-CM

## 2019-01-09 DIAGNOSIS — O34211 Maternal care for low transverse scar from previous cesarean delivery: Secondary | ICD-10-CM | POA: Diagnosis not present

## 2019-01-09 DIAGNOSIS — O99210 Obesity complicating pregnancy, unspecified trimester: Secondary | ICD-10-CM

## 2019-01-09 DIAGNOSIS — Z3A26 26 weeks gestation of pregnancy: Secondary | ICD-10-CM

## 2019-01-09 DIAGNOSIS — O26892 Other specified pregnancy related conditions, second trimester: Secondary | ICD-10-CM | POA: Diagnosis not present

## 2019-01-09 DIAGNOSIS — O34219 Maternal care for unspecified type scar from previous cesarean delivery: Secondary | ICD-10-CM

## 2019-01-09 DIAGNOSIS — Z3480 Encounter for supervision of other normal pregnancy, unspecified trimester: Secondary | ICD-10-CM

## 2019-01-09 LAB — PROTEIN / CREATININE RATIO, URINE
Creatinine, Urine: 244 mg/dL
Protein Creatinine Ratio: 0.06 mg/mg{Cre} (ref 0.00–0.15)
Total Protein, Urine: 14 mg/dL

## 2019-01-09 LAB — COMPREHENSIVE METABOLIC PANEL
ALT: 10 U/L (ref 0–44)
AST: 15 U/L (ref 15–41)
Albumin: 3.3 g/dL — ABNORMAL LOW (ref 3.5–5.0)
Alkaline Phosphatase: 74 U/L (ref 38–126)
Anion gap: 12 (ref 5–15)
BUN: 7 mg/dL (ref 6–20)
CO2: 19 mmol/L — ABNORMAL LOW (ref 22–32)
Calcium: 8.8 mg/dL — ABNORMAL LOW (ref 8.9–10.3)
Chloride: 108 mmol/L (ref 98–111)
Creatinine, Ser: 0.48 mg/dL (ref 0.44–1.00)
GFR calc Af Amer: >60 mL/min (ref >60)
GFR calc non Af Amer: >60 mL/min (ref >60)
Glucose, Bld: 84 mg/dL (ref 70–99)
Potassium: 3.7 mmol/L (ref 3.5–5.1)
Sodium: 139 mmol/L (ref 135–145)
Total Bilirubin: 0.7 mg/dL (ref 0.3–1.2)
Total Protein: 6.9 g/dL (ref 6.5–8.1)

## 2019-01-09 LAB — CBC
HCT: 35.3 % — ABNORMAL LOW (ref 36.0–46.0)
Hemoglobin: 12.2 g/dL (ref 12.0–15.0)
MCH: 27.7 pg (ref 26.0–34.0)
MCHC: 34.6 g/dL (ref 30.0–36.0)
MCV: 80 fL (ref 80.0–100.0)
Platelets: 262 10*3/uL (ref 150–400)
RBC: 4.41 MIL/uL (ref 3.87–5.11)
RDW: 13.1 % (ref 11.5–15.5)
WBC: 11.6 10*3/uL — ABNORMAL HIGH (ref 4.0–10.5)
nRBC: 0 % (ref 0.0–0.2)

## 2019-01-09 MED ORDER — ACETAMINOPHEN 325 MG PO TABS
650.0000 mg | ORAL_TABLET | ORAL | Status: DC | PRN
  Administered 2019-01-09: 650 mg via ORAL
  Filled 2019-01-09: qty 2

## 2019-01-09 NOTE — Discharge Summary (Signed)
Physician Final Progress Note  Patient ID: Breanna Lamb MRN: [id number] DOB/AGE: January 01, 1991 29 y.o.  Admit date: 01/09/2019 Admitting provider: [name], MD Discharge date: 01/09/2019   Admission Diagnoses: Elevated home BP reading  Discharge Diagnoses:  Active Problems:   Labor and delivery indication for care or intervention  29 y.o. [id number] at [redacted]w[redacted]d by Estimated Date of Delivery: 04/12/19 presenting with elevated home BP reading.  150-156/72-82.  First reading in ER 150/82.  All reading on labor and delivery normotensive.  Preeclampsia panel was sent with no lab abnormalities noted.  Patient with a history of gestational hypertension and LTCS for transverse lie with G2.  +FM, no LOF, no VB  Blood pressure (!) 118/49, pulse 92, temperature 98.4 F (36.9 C), temperature source Oral, resp. rate 18, height 5\' 1"  (1.549 m), weight 108 kg, last menstrual period 06/26/2018, SpO2 100 %.  Pregnancy#3 Problems (from 06/26/18 to present)    Problem Noted Resolved   Low-lying placenta 11/19/2018 by [name], MD No   Overview Signed 12/14/2018  4:49 PM by [name], MD    Resolved 23 week anatomy follow up      Supervision of other normal pregnancy, antepartum 07/29/2018 by [name], MD No   Overview Addendum 12/18/2018 10:45 AM by [name], MD    [facility] Prenatal Labs  Dating 5 week [place] Blood type: A/Positive/-- (08/05 1047)   Genetic Screen Inheritest:SMA neg, CF neg, Fragile-X neg  NIPS: XY Antibody:Negative (08/05 1047)  Anatomic [place] Complete 12/7 Rubella: 5.49 (08/05 1047) Varicella: Immune  GTT Early:99 Third trimester:  RPR: Non Reactive (08/05 1047)   Rhogam N/A HBsAg: Negative (08/05 1047)   TDaP vaccine                       Flu Shot: HIV: Non Reactive (08/05 1047)   Baby Food                                GBS:   Contraception  Pap: 04/13/2018 NIL  CBB     CS/VBAC ?   Support Person            Maternal obesity, antepartum  07/29/2018 by [name], MD No   History of cesarean section complicating pregnancy [id number] by [name], MD No   Overview Signed 08/25/2018  9:25 AM by [name], MD    Transverse lie G2      History of gestational hypertension 07/29/2018 by [name], MD No       Consults: None  Significant Findings/ Diagnostic Studies:  Results for orders placed or performed during the hospital encounter of 01/09/19 (from the past 24 hour(s))  Protein / creatinine ratio, urine     Status: None   Collection Time: 01/09/19  7:15 PM  Result Value Ref Range   Creatinine, Urine 244 mg/dL   Total Protein, Urine 14 mg/dL   Protein Creatinine Ratio 0.06 0.00 - 0.15 mg/mg[Cre]  Comprehensive metabolic panel     Status: Abnormal   Collection Time: 01/09/19  7:16 PM  Result Value Ref Range   Sodium 139 135 - 145 mmol/L   Potassium 3.7 3.5 - 5.1 mmol/L   Chloride 108 98 - 111 mmol/L   CO2 19 (L) 22 - 32 mmol/L   Glucose, Bld 84 70 - 99 mg/dL   BUN 7 6 - 20 mg/dL   Creatinine, Ser 0.48 0.44 -  1.00 mg/dL   Calcium 8.8 (L) 8.9 - 10.3 mg/dL   Total Protein 6.9 6.5 - 8.1 g/dL   Albumin 3.3 (L) 3.5 - 5.0 g/dL   AST 15 15 - 41 U/L   ALT 10 0 - 44 U/L   Alkaline Phosphatase 74 38 - 126 U/L   Total Bilirubin 0.7 0.3 - 1.2 mg/dL   GFR calc non Af Amer >60 >60 mL/min   GFR calc Af Amer >60 >60 mL/min   Anion gap 12 5 - 15  CBC     Status: Abnormal   Collection Time: 01/09/19  7:16 PM  Result Value Ref Range   WBC 11.6 (H) 4.0 - 10.5 K/uL   RBC 4.41 3.87 - 5.11 MIL/uL   Hemoglobin 12.2 12.0 - 15.0 g/dL   HCT 35.3 (L) 36.0 - 46.0 %   MCV 80.0 80.0 - 100.0 fL   MCH 27.7 26.0 - 34.0 pg   MCHC 34.6 30.0 - 36.0 g/dL   RDW 13.1 11.5 - 15.5 %   Platelets 262 150 - 400 K/uL   nRBC 0.0 0.0 - 0.2 %     Procedures:  Baseline: 135 Variability: moderate Accelerations: present Decelerations: absent Tocometry: none The patient was monitored for 30 minutes, fetal heart rate  tracing was deemed reactive, category I tracing,   Discharge Condition: good  Disposition: Discharge disposition: 01-Home or Self Care       Diet: Regular diet  Discharge Activity: Activity as tolerated  Discharge Instructions    Discharge activity:  No Restrictions   Complete by: As directed    Discharge diet:  No restrictions   Complete by: As directed    No sexual activity restrictions   Complete by: As directed    Notify physician for a general feeling that "something is not right"   Complete by: As directed    Notify physician for increase or change in vaginal discharge   Complete by: As directed    Notify physician for intestinal cramps, with or without diarrhea, sometimes described as "gas pain"   Complete by: As directed    Notify physician for leaking of fluid   Complete by: As directed    Notify physician for low, dull backache, unrelieved by heat or Tylenol   Complete by: As directed    Notify physician for menstrual like cramps   Complete by: As directed    Notify physician for pelvic pressure   Complete by: As directed    Notify physician for uterine contractions.  These may be painless and feel like the uterus is tightening or the baby is  "balling up"   Complete by: As directed    Notify physician for vaginal bleeding   Complete by: As directed    PRETERM LABOR:  Includes any of the follwing symptoms that occur between 20 - [redacted] weeks gestation.  If these symptoms are not stopped, preterm labor can result in preterm delivery, placing your baby at risk   Complete by: As directed      Allergies as of 01/09/2019      Reactions   Nickel Rash      Medication List    TAKE these medications   multivitamin-prenatal 27-0.8 MG Tabs tablet Take 1 tablet by mouth daily at 12 noon.        Total time spent taking care of this patient: 30 minutes  Signed: [name] 01/09/2019, 8:10 PM

## 2019-01-09 NOTE — OB Triage Note (Addendum)
Patient to OBS 3 with complaints of having blood pressures at home 158/72 and 150/82 in ED.  She states that she has felt dizzy and had blurriness in her Left eye, and had a dull headache.  She also states that she has pain under her right rib with normal movements that she rates 4/10.  She has a rash that is reddened on her inner right elbow.  First BP on arrival was 137/65.  Will cycle blood pressures.

## 2019-01-09 NOTE — OB Triage Note (Signed)
Discharge instructions provided and reviewed.  Follow up care discussed.  Pt verbalizes understanding.

## 2019-01-11 ENCOUNTER — Ambulatory Visit (INDEPENDENT_AMBULATORY_CARE_PROVIDER_SITE_OTHER): Payer: No Typology Code available for payment source | Admitting: Obstetrics and Gynecology

## 2019-01-11 ENCOUNTER — Other Ambulatory Visit: Payer: Self-pay

## 2019-01-11 ENCOUNTER — Ambulatory Visit (INDEPENDENT_AMBULATORY_CARE_PROVIDER_SITE_OTHER): Payer: No Typology Code available for payment source

## 2019-01-11 VITALS — BP 130/80 | Wt 244.0 lb

## 2019-01-11 DIAGNOSIS — Z3480 Encounter for supervision of other normal pregnancy, unspecified trimester: Secondary | ICD-10-CM

## 2019-01-11 DIAGNOSIS — O365920 Maternal care for other known or suspected poor fetal growth, second trimester, not applicable or unspecified: Secondary | ICD-10-CM | POA: Diagnosis not present

## 2019-01-11 DIAGNOSIS — Z3A27 27 weeks gestation of pregnancy: Secondary | ICD-10-CM

## 2019-01-11 DIAGNOSIS — Z8759 Personal history of other complications of pregnancy, childbirth and the puerperium: Secondary | ICD-10-CM

## 2019-01-11 DIAGNOSIS — O99210 Obesity complicating pregnancy, unspecified trimester: Secondary | ICD-10-CM

## 2019-01-11 DIAGNOSIS — Z362 Encounter for other antenatal screening follow-up: Secondary | ICD-10-CM | POA: Diagnosis not present

## 2019-01-11 DIAGNOSIS — O4440 Low lying placenta NOS or without hemorrhage, unspecified trimester: Secondary | ICD-10-CM

## 2019-01-11 DIAGNOSIS — O99212 Obesity complicating pregnancy, second trimester: Secondary | ICD-10-CM

## 2019-01-11 DIAGNOSIS — O09292 Supervision of pregnancy with other poor reproductive or obstetric history, second trimester: Secondary | ICD-10-CM

## 2019-01-11 DIAGNOSIS — O4442 Low lying placenta NOS or without hemorrhage, second trimester: Secondary | ICD-10-CM

## 2019-01-11 DIAGNOSIS — O34219 Maternal care for unspecified type scar from previous cesarean delivery: Secondary | ICD-10-CM

## 2019-01-11 LAB — POCT URINALYSIS DIPSTICK OB
Glucose, UA: NEGATIVE
POC,PROTEIN,UA: NEGATIVE

## 2019-01-11 NOTE — Progress Notes (Signed)
Routine Prenatal Care Visit  Subjective  Breanna Lamb is a 29 y.o. G3P2002 at [redacted]w[redacted]d being seen today for ongoing prenatal care.  She is currently monitored for the following issues for this high-risk pregnancy and has Premenstrual dysphoric syndrome; Chronic tension-type headache, not intractable; Hypoglycemia; Supervision of other normal pregnancy, antepartum; Maternal obesity, antepartum; History of cesarean section complicating pregnancy; History of gestational hypertension; Genital herpes simplex virus (HSV) infection in mother affecting pregnancy; and Low-lying placenta on their problem list.  ----------------------------------------------------------------------------------- Patient reports no complaints.   Contractions: Not present. Vag. Bleeding: None.  Movement: Present. Denies leaking of fluid.  ----------------------------------------------------------------------------------- The following portions of the patient's history were reviewed and updated as appropriate: allergies, current medications, past family history, past medical history, past social history, past surgical history and problem list. Problem list updated.   Objective  Blood pressure 130/80, weight 244 lb (110.7 kg), last menstrual period 06/26/2018. Pregravid weight 225 lb (102.1 kg) Total Weight Gain 19 lb (8.618 kg) Urinalysis:      Fetal Status: Fetal Heart Rate (bpm): 135   Movement: Present     General:  Alert, oriented and cooperative. Patient is in no acute distress.  Skin: Skin is warm and dry. No rash noted.   Cardiovascular: Normal heart rate noted  Respiratory: Normal respiratory effort, no problems with respiration noted  Abdomen: Soft, gravid, appropriate for gestational age. Pain/Pressure: Absent     Pelvic:  Cervical exam deferred        Extremities: Normal range of motion.     ental Status: Normal Lamb and affect. Normal behavior. Normal judgment and thought content.   US OB Follow  Up  Result Date: 01/11/2019 Patient Name: Breanna Lamb DOB: 26-Mar-1990 MRN: [id number] ULTRASOUND REPORT Location: [facility] Date of Service: 01/11/2019 Indications:growth/afi Findings: Breanna Lamb intrauterine pregnancy is visualized.  Biometrics give an (U/S) Gestational age of [redacted]w[redacted]d and an (U/S) EDD of 04/09/2019; this correlates with the clinically established Estimated Date of Delivery: 04/12/19. Fetal presentation is transverse. Placenta: posterior. Grade: 1 AFI: 17.8 cm Growth percentile is 61.3%.  AC percentile is 74.6%. EFW: 1108 g  ( 2 lb 7 oz ) Impression: 1. [redacted]w[redacted]d Viable Singleton Intrauterine pregnancy previously established criteria. 2. Growth is 61.3 %ile.  AFI is 17.8 cm. Recommendations: 1.Clinical correlation with the patient's History and Physical Exam. Breanna Lamb, RT There is a singleton gestation with normal amniotic fluid volume. The fetal biometry correlates with established dating.  Limited fetal anatomy was performed.The visualized fetal anatomical survey appears within normal limits within the resolution of ultrasound as described above.  It must be noted that a normal ultrasound is unable to rule out fetal aneuploidy.  Breanna Mood, MD, [facility], [facility] 01/11/2019, 3:27 PM   US Transvaginal Non-OB  Result Date: 12/14/2018 Patient Name: Breanna Lamb DOB: [birth year] MRN: [id number] ULTRASOUND REPORT Location: [facility] Date of Service: 12/14/2018 Indications: Anatomy follow up ultrasound Findings: Breanna Lamb intrauterine pregnancy is visualized with FHR at 146 BPM. Fetal presentation is Breech. Placenta: posterior. Grade: 1 AFI: subjectively normal. Anatomic survey is complete. The nose/lips, 4CH, RVOT, 3VV, DA, kidneys, heel x 2, placental edge were seen today and appear normal. There is no free peritoneal fluid in the cul de sac. Impression: 1. [redacted]w[redacted]d Viable Singleton Intrauterine pregnancy previously established criteria. 2.  Normal Anatomy Scan is now complete. 3. The placental edge is 8 cm away form the internal os of the cervix. Recommendations: 1.Clinical correlation with the  patient's History and Physical Exam. Breanna Lamb, RT There is a singleton gestation with subjectively normal amniotic fluid volume.  Limited evaluation of the fetal anatomy was performed today, focusing on on anatomic structures not fully visualized at the time of prior study.The visualized fetal anatomical survey appears within normal limits within the resolution of ultrasound as described above, and the anatomic survey is now complete.  It must be noted that a normal ultrasound is unable to rule out fetal aneuploidy, subtle defects such as small ASD or VDS may also not be visible on imaging. Breanna Mood, MD, [facility], [facility] 12/14/2018, 4:29 PM     Assessment   29 y.o. G3P2002 at [redacted]w[redacted]d by  04/12/2019, by Ultrasound presenting for routine prenatal visit  Plan   Pregnancy#3 Problems (from 06/26/18 to present)    Problem Noted Resolved   Low-lying placenta 11/19/2018 by Breanna Mood, MD No   Overview Signed 12/14/2018  4:49 PM by Breanna Mood, MD    Resolved 23 week anatomy follow up      Supervision of other normal pregnancy, antepartum 07/29/2018 by Breanna Mood, MD No   Overview Addendum 12/18/2018 10:45 AM by Breanna Mood, MD    [facility] Prenatal Labs  Dating 5 week [place] Blood type: A/Positive/-- (08/05 1047)   Genetic Screen Inheritest:SMA neg, CF neg, Fragile-X neg  NIPS: XY Antibody:Negative (08/05 1047)  Anatomic [place] Complete 12/7 Rubella: 5.49 (08/05 1047) Varicella: Immune  GTT Early:99 Third trimester:  RPR: Non Reactive (08/05 1047)   Rhogam N/A HBsAg: Negative (08/05 1047)   TDaP vaccine                       Flu Shot: HIV: Non Reactive (08/05 1047)   Baby Food                                GBS:   Contraception  Pap: 04/13/2018 NIL  CBB     CS/VBAC ?   Support  Person            Maternal obesity, antepartum 07/29/2018 by Breanna Mood, MD No   History of cesarean section complicating pregnancy [id number] by Breanna Mood, MD No   Overview Signed 08/25/2018  9:25 AM by Breanna Mood, MD    Transverse lie G2      History of gestational hypertension 07/29/2018 by Breanna Mood, MD No       Gestational age appropriate obstetric precautions including but not limited to vaginal bleeding, contractions, leaking of fluid and fetal movement were reviewed in detail with the patient.    - growth scan appropriate growth c/w with gestational age - normotensive today was seen over the weekend for elevated BP on home readings - 28 week labs collected today  Return in about 2 weeks (around 01/25/2019) for [place].  Breanna Mood, MD, [facility], [facility] 01/11/2019, 3:50 PM

## 2019-01-12 LAB — 28 WEEK RH+PANEL
Basophils Absolute: 0 10*3/uL (ref 0.0–0.2)
Basos: 0 %
EOS (ABSOLUTE): 0.2 10*3/uL (ref 0.0–0.4)
Eos: 2 %
Gestational Diabetes Screen: 79 mg/dL (ref 65–139)
HIV Screen 4th Generation wRfx: NONREACTIVE
Hematocrit: 35.5 % (ref 34.0–46.6)
Hemoglobin: 12 g/dL (ref 11.1–15.9)
Immature Grans (Abs): 0.1 10*3/uL (ref 0.0–0.1)
Immature Granulocytes: 1 %
Lymphocytes Absolute: 2.4 10*3/uL (ref 0.7–3.1)
Lymphs: 24 %
MCH: 28.2 pg (ref 26.6–33.0)
MCHC: 33.8 g/dL (ref 31.5–35.7)
MCV: 84 fL (ref 79–97)
Monocytes Absolute: 0.7 10*3/uL (ref 0.1–0.9)
Monocytes: 7 %
Neutrophils Absolute: 6.6 10*3/uL (ref 1.4–7.0)
Neutrophils: 66 %
Platelets: 261 10*3/uL (ref 150–450)
RBC: 4.25 x10E6/uL (ref 3.77–5.28)
RDW: 12.9 % (ref 11.7–15.4)
RPR Ser Ql: NONREACTIVE
WBC: 10 10*3/uL (ref 3.4–10.8)

## 2019-01-25 ENCOUNTER — Other Ambulatory Visit: Payer: Self-pay

## 2019-01-25 ENCOUNTER — Ambulatory Visit (INDEPENDENT_AMBULATORY_CARE_PROVIDER_SITE_OTHER): Payer: No Typology Code available for payment source | Admitting: Obstetrics and Gynecology

## 2019-01-25 VITALS — BP 122/68 | Wt 246.0 lb

## 2019-01-25 DIAGNOSIS — Z3A29 29 weeks gestation of pregnancy: Secondary | ICD-10-CM

## 2019-01-25 DIAGNOSIS — O4443 Low lying placenta NOS or without hemorrhage, third trimester: Secondary | ICD-10-CM

## 2019-01-25 DIAGNOSIS — O99213 Obesity complicating pregnancy, third trimester: Secondary | ICD-10-CM

## 2019-01-25 DIAGNOSIS — O98313 Other infections with a predominantly sexual mode of transmission complicating pregnancy, third trimester: Secondary | ICD-10-CM

## 2019-01-25 DIAGNOSIS — A6009 Herpesviral infection of other urogenital tract: Secondary | ICD-10-CM

## 2019-01-25 DIAGNOSIS — O4440 Low lying placenta NOS or without hemorrhage, unspecified trimester: Secondary | ICD-10-CM

## 2019-01-25 DIAGNOSIS — O34219 Maternal care for unspecified type scar from previous cesarean delivery: Secondary | ICD-10-CM

## 2019-01-25 DIAGNOSIS — O99210 Obesity complicating pregnancy, unspecified trimester: Secondary | ICD-10-CM

## 2019-01-25 DIAGNOSIS — Z3480 Encounter for supervision of other normal pregnancy, unspecified trimester: Secondary | ICD-10-CM

## 2019-01-25 DIAGNOSIS — Z8759 Personal history of other complications of pregnancy, childbirth and the puerperium: Secondary | ICD-10-CM

## 2019-01-25 LAB — POCT URINALYSIS DIPSTICK OB
Glucose, UA: NEGATIVE
POC,PROTEIN,UA: NEGATIVE

## 2019-01-25 NOTE — Progress Notes (Signed)
ROB Started baby Aspirin

## 2019-01-25 NOTE — Progress Notes (Signed)
Routine Prenatal Care Visit  Subjective  Breanna Lamb Both is a 29 y.o. G3P2002 at [redacted]w[redacted]d being seen today for ongoing prenatal care.  She is currently monitored for the following issues for this low-risk pregnancy and has Premenstrual dysphoric syndrome; Chronic tension-type headache, not intractable; Hypoglycemia; Supervision of other normal pregnancy, antepartum; Maternal obesity, antepartum; History of cesarean section complicating pregnancy; History of gestational hypertension; Genital herpes simplex virus (HSV) infection in mother affecting pregnancy; and Low-lying placenta on their problem list.  ----------------------------------------------------------------------------------- Patient reports no complaints.   Contractions: Not present. Vag. Bleeding: None.  Movement: Present. Denies leaking of fluid.  ----------------------------------------------------------------------------------- The following portions of the patient's history were reviewed and updated as appropriate: allergies, current medications, past family history, past medical history, past social history, past surgical history and problem list. Problem list updated.   Objective  Blood pressure 122/68, weight 246 lb (111.6 kg), last menstrual period 06/26/2018. Pregravid weight 225 lb (102.1 kg) Total Weight Gain 21 lb (9.526 kg)  Body mass index is 46.48 kg/m.  Urinalysis:      Fetal Status: Fetal Heart Rate (bpm): 140 Fundal Height: 33 cm Movement: Present     General:  Alert, oriented and cooperative. Patient is in no acute distress.  Skin: Skin is warm and dry. No rash noted.   Cardiovascular: Normal heart rate noted  Respiratory: Normal respiratory effort, no problems with respiration noted  Abdomen: Soft, gravid, appropriate for gestational age. Pain/Pressure: Absent     Pelvic:  Cervical exam deferred        Extremities: Normal range of motion.     ental Status: Normal mood and affect. Normal behavior.  Normal judgment and thought content.     Assessment   29 y.o. G3P2002 at [redacted]w[redacted]d by  04/12/2019, by Ultrasound presenting for routine prenatal visit  Plan   Pregnancy#3 Problems (from 06/26/18 to present)    Problem Noted Resolved   Low-lying placenta 11/19/2018 by [name], MD No   Overview Signed 12/14/2018  4:49 PM by [name], MD    Resolved 23 week anatomy follow up      Supervision of other normal pregnancy, antepartum 07/29/2018 by [name], MD No   Overview Addendum 01/12/2019 11:00 AM by [name], MD    [facility] Prenatal Labs  Dating 5 week [place] Blood type: A/Positive/-- (08/05 1047)   Genetic Screen Inheritest:SMA neg, CF neg, Fragile-X neg  NIPS: XY Antibody:Negative (08/05 1047)  Anatomic [place] Complete 12/7 Rubella: 5.49 (08/05 1047) Varicella: Immune  GTT Early:99 Third trimester: 79 RPR: Non Reactive (08/05 1047)   Rhogam N/A HBsAg: Negative (08/05 1047)   TDaP vaccine                       Flu Shot: HIV: Non Reactive (08/05 1047)   Baby Food                                GBS:   Contraception  Pap: 04/13/2018 NIL  CBB     CS/VBAC ?   Support Person            Maternal obesity, antepartum 07/29/2018 by [name], MD No   History of cesarean section complicating pregnancy [id number] by [name], MD No   Overview Signed 08/25/2018  9:25 AM by [name], MD    Transverse lie G2      History of gestational hypertension  07/29/2018 by [name], MD No       Gestational age appropriate obstetric precautions including but not limited to vaginal bleeding, contractions, leaking of fluid and fetal movement were reviewed in detail with the patient.    - C-section date 3/31/2021but considering TOLAC  29 y.o. G3P2002 at [redacted]w[redacted]d with Estimated Date of Delivery: 04/12/19 was seen today in office to discuss trial of labor after cesarean section (TOLAC) versus elective repeat cesarean delivery (ERCD). The following  risks were discussed with the patient.  Risk of uterine rupture at term is 0.78 percent with TOLAC and 0.22 percent with ERCD. 1 in 10 uterine ruptures will result in neonatal death or neurological injury. The benefits of a trial of labor after cesarean (TOLAC) resulting in a vaginal birth after cesarean (VBAC) include the following: shorter length of hospital stay and postpartum recovery (in most cases); fewer complications, such as postpartum fever, wound or uterine infection, thromboembolism (blood clots in the leg or lung), need for blood transfusion and fewer neonatal breathing problems. The risks of an attempted VBAC or TOLAC include the following: Risk of failed trial of labor after cesarean (TOLAC) without a vaginal birth after cesarean (VBAC) resulting in repeat cesarean delivery (RCD) in about 20 to 39 percent of women who attempt VBAC.  Her individualized success rate using the MFMU VBAC risk calculator is 69.6%.   Risk of rupture of uterus resulting in an emergency cesarean delivery. The risk of uterine rupture may be related in part to the type of uterine incision made during the first cesarean delivery. A previous transverse uterine incision has the lowest risk of rupture (0.2 to 1.5 percent risk). Vertical or T-shaped uterine incisions have a higher risk of uterine rupture (4 to 9 percent risk)The risk of fetal death is very low with both VBAC and elective repeat cesarean delivery (ERCD), but the likelihood of fetal death is higher with VBAC than with ERCD. Maternal death is very rare with either type of delivery. The risks of an elective repeat cesarean delivery (ERCD) were reviewed with the patient including but not limited to: 02/998 risk of uterine rupture which could have serious consequences, bleeding which may require transfusion; infection which may require antibiotics; injury to bowel, bladder or other surrounding organs (bowel, bladder, ureters); injury to the fetus; need for  additional procedures including hysterectomy in the event of a life-threatening hemorrhage; thromboembolic phenomenon; abnormal placentation; incisional problems; death and other postoperative or anesthesia complications.    In addition we discussed that our collective office practice is to allow patient's who desire to attempt TOLAC to go into labor naturally.  There is some limited data that rupture rate may increase past [redacted] weeks gestation, but it is reasonable for women who are strongly committed to [facility] to continue pregnancy into the 41st week.  Medical indications necessetating early delivery may arise during the course of any pregnancy.  Given the contraindication on the use of prostaglandins for use in cervical ripening,  recommendation would be to proceed with repeat cesarean for delivery for patient's with unfavorable cervix (low Bishops score) who reach 41 weeks or who otherwise have a medical indication for early delivery.   These risks and benefits are summarized on the consent form, which was reviewed with the patient during the visit.  All her questions answered and she signed a consent indicating a preference for TOLAC/ERCD. A copy of the consent was given to the patient.  MFMU VBAC calculator success rate of 69.6 with 95% CI  of 64.1% to 74.6%.  The patient was counseled regarding recommendation for elective repeat cesarean section (ERCS) given that she has failed to go into spontaneous labor by [redacted]w[redacted]d gestation and has an unfavorable cervix.  Previous studies examining have noted an increased risk or maternal and fetal morbidity for patient attempting a trial of labor whose success rated is <70% compared to [facility], while morbidity was similar for patient attempting a trial of labor vs ERCS with success rates >70. ("Can a prediction model for vaginal birth after cesarean section also predict the probablity of  Morbidity related to a trial of labor?" American Jourcal of Obstetric and Gynecology  2009  January)    Return in about 2 weeks (around 02/08/2019) for ROB and growth.  [name], MD, [facility], [facility] 01/25/2019, 12:11 PM

## 2019-01-26 ENCOUNTER — Other Ambulatory Visit: Payer: Self-pay | Admitting: Obstetrics and Gynecology

## 2019-02-02 ENCOUNTER — Telehealth: Payer: Self-pay | Admitting: Obstetrics and Gynecology

## 2019-02-02 NOTE — Telephone Encounter (Signed)
-----   Message from [name], MD sent at 01/26/2019  7:47 AM EST ----- Regarding: surgery (c-section) Surgery Booking Request Patient Full Name:  Breanna Lamb  MRN: [id number]  DOB: 03/15/1990  Surgeon: [name], MD  Requested Surgery Date and Time: 04/07/2019 8 AM (will have midwife available for assist after 8:00) Primary Diagnosis AND Code: Repeat cesarean section Secondary Diagnosis and Code:  Surgical Procedure: Cesarean Section L&D Notification: Yes Admission Status: surgery admit Length of Surgery: 1hr Special Case Needs: No H&P: Yes Phone Interview???:  No Interpreter: No Language:  Medical Clearance:  No Special Scheduling Instructions: none Any known health/anesthesia issues, diabetes, sleep apnea, latex allergy, defibrillator/pacemaker?: No Acuity: P1   (P1 highest, P2 delay may cause harm, P3 low, elective gyn, P4 lowest)

## 2019-02-02 NOTE — Telephone Encounter (Signed)
Patient is aware of H&P at [facility] on 3/24 @ 11:30am w/ Dr. Georgianne Fick, Pre-admit testing to be scheduled, COVID testing on 3/22, and OR on 03/31/19.

## 2019-02-08 ENCOUNTER — Ambulatory Visit (INDEPENDENT_AMBULATORY_CARE_PROVIDER_SITE_OTHER): Payer: No Typology Code available for payment source | Admitting: Obstetrics and Gynecology

## 2019-02-08 ENCOUNTER — Ambulatory Visit (INDEPENDENT_AMBULATORY_CARE_PROVIDER_SITE_OTHER): Payer: No Typology Code available for payment source

## 2019-02-08 ENCOUNTER — Other Ambulatory Visit: Payer: Self-pay

## 2019-02-08 VITALS — BP 126/64 | Wt 247.0 lb

## 2019-02-08 DIAGNOSIS — Z3480 Encounter for supervision of other normal pregnancy, unspecified trimester: Secondary | ICD-10-CM

## 2019-02-08 DIAGNOSIS — Z3A31 31 weeks gestation of pregnancy: Secondary | ICD-10-CM

## 2019-02-08 DIAGNOSIS — O98313 Other infections with a predominantly sexual mode of transmission complicating pregnancy, third trimester: Secondary | ICD-10-CM

## 2019-02-08 DIAGNOSIS — O99210 Obesity complicating pregnancy, unspecified trimester: Secondary | ICD-10-CM

## 2019-02-08 DIAGNOSIS — O34219 Maternal care for unspecified type scar from previous cesarean delivery: Secondary | ICD-10-CM

## 2019-02-08 DIAGNOSIS — Z8759 Personal history of other complications of pregnancy, childbirth and the puerperium: Secondary | ICD-10-CM

## 2019-02-08 DIAGNOSIS — Z23 Encounter for immunization: Secondary | ICD-10-CM

## 2019-02-08 DIAGNOSIS — O99213 Obesity complicating pregnancy, third trimester: Secondary | ICD-10-CM

## 2019-02-08 DIAGNOSIS — A6009 Herpesviral infection of other urogenital tract: Secondary | ICD-10-CM

## 2019-02-08 DIAGNOSIS — O4443 Low lying placenta NOS or without hemorrhage, third trimester: Secondary | ICD-10-CM

## 2019-02-08 DIAGNOSIS — Z3483 Encounter for supervision of other normal pregnancy, third trimester: Secondary | ICD-10-CM

## 2019-02-08 DIAGNOSIS — O4440 Low lying placenta NOS or without hemorrhage, unspecified trimester: Secondary | ICD-10-CM

## 2019-02-08 DIAGNOSIS — O98319 Other infections with a predominantly sexual mode of transmission complicating pregnancy, unspecified trimester: Secondary | ICD-10-CM

## 2019-02-08 NOTE — Progress Notes (Signed)
Routine Prenatal Care Visit  Subjective  Breanna Lamb is a 29 y.o. G3P2002 at [redacted]w[redacted]d being seen today for ongoing prenatal care.  She is currently monitored for the following issues for this low-risk pregnancy and has Premenstrual dysphoric syndrome; Chronic tension-type headache, not intractable; Hypoglycemia; Supervision of other normal pregnancy, antepartum; Maternal obesity, antepartum; History of cesarean section complicating pregnancy; History of gestational hypertension; Genital herpes simplex virus (HSV) infection in mother affecting pregnancy; and Low-lying placenta on their problem list.  ----------------------------------------------------------------------------------- Patient reports no complaints.   Contractions: Not present. Vag. Bleeding: None.  Movement: Present. Denies leaking of fluid.  ----------------------------------------------------------------------------------- The following portions of the patient's history were reviewed and updated as appropriate: allergies, current medications, past family history, past medical history, past social history, past surgical history and problem list. Problem list updated.   Objective  Blood pressure 126/64, weight 247 lb (112 kg), last menstrual period 06/26/2018. Pregravid weight 225 lb (102.1 kg) Total Weight Gain 22 lb (9.979 kg) Urinalysis:      Fetal Status: Fetal Heart Rate (bpm): 145 Fundal Height: 33 cm Movement: Present  Presentation: Vertex  General:  Alert, oriented and cooperative. Patient is in no acute distress.  Skin: Skin is warm and dry. No rash noted.   Cardiovascular: Normal heart rate noted  Respiratory: Normal respiratory effort, no problems with respiration noted  Abdomen: Soft, gravid, appropriate for gestational age. Pain/Pressure: Absent     Pelvic:  Cervical exam deferred        Extremities: Normal range of motion.     ental Status: Normal mood and affect. Normal behavior. Normal judgment and  thought content.   US OB Follow Up  Result Date: 02/08/2019 Patient Name: HOWARD MESSNER DOB: [birth year] MRN: [id number] ULTRASOUND REPORT Location: [facility] Date of Service: 02/08/2019 Indications:growth/afi Findings: Nelda Marseille intrauterine pregnancy is visualized with FHR at 126 BPM. Biometrics give an (U/S) Gestational age of [redacted]w[redacted]d and an (U/S) EDD of 04/09/2019; this correlates with the clinically established Estimated Date of Delivery: 04/12/19. Fetal presentation is Cephalic. Placenta: posterior. Grade: 2 AFI: 15.7 cm Growth percentile is 56.5%.  AC percentile is 81.7%. EFW: 1785 g ( 3 lb 15 oz ) Impression: 1. [redacted]w[redacted]d Viable Singleton Intrauterine pregnancy previously established criteria. 2. Growth is 56.5 %ile.  AFI is 15.7 cm. Recommendations: 1.Clinical correlation with the patient's History and Physical Exam. Gweneth Dimitri, RT There is a singleton gestation with normal amniotic fluid volume. The fetal biometry correlates with established dating.  Limited fetal anatomy was performed.The visualized fetal anatomical survey appears within normal limits within the resolution of ultrasound as described above.  It must be noted that a normal ultrasound is unable to rule out fetal aneuploidy.  [name], MD, [facility]   US OB Follow Up  Result Date: 01/11/2019 Patient Name: DARNIKA SOBEL DOB: 10/03/1990 MRN: [id number] ULTRASOUND REPORT Location: [facility] Date of Service: 01/11/2019 Indications:growth/afi Findings: Nelda Marse[place] intrauterine pregnancy is visualized.  Biometrics give an (U/S) Gestational age of [redacted]w[redacted]d and an (U/S) EDD of 04/09/2019; this correlates with the clinically established Estimated Date of Delivery: 04/12/19. Fetal presentation is transverse. Placenta: posterior. Grade: 1 AFI: 17.8 cm Growth percentile is 61.3%.  AC percentile is 74.6%. EFW: 1108 g  ( 2 lb 7 oz ) Impression: 1. [redacted]w[redacted]d Viable Singleton Intrauterine pregnancy  previously established criteria. 2. Growth is 61.3 %ile.  AFI is 17.8 cm. Recommendations: 1.Clinical correlation with the patient's History and Physical Exam. Thad Ranger  S [place], RT There is a singleton gestation with normal amniotic fluid volume. The fetal biometry correlates with established dating.  Limited fetal anatomy was performed.The visualized fetal anatomical survey appears within normal limits within the resolution of ultrasound as described above.  It must be noted that a normal ultrasound is unable to rule out fetal aneuploidy.  [name], MD, [facility], [facility] 01/11/2019, 3:27 PM     Assessment   29 y.o. G3P2002 at [redacted]w[redacted]d by  04/12/2019, by Ultrasound presenting for routine prenatal visit  Plan   Pregnancy#3 Problems (from 06/26/18 to present)    Problem Noted Resolved   Low-lying placenta 11/19/2018 by [name], MD No   Overview Signed 12/14/2018  4:49 PM by [name], MD    Resolved 23 week anatomy follow up      Supervision of other normal pregnancy, antepartum 07/29/2018 by [name], MD No   Overview Addendum 01/12/2019 11:00 AM by [name], MD    [facility] Prenatal Labs  Dating 5 week [place] Blood type: A/Positive/-- (08/05 1047)   Genetic Screen Inheritest:SMA neg, CF neg, Fragile-X neg  NIPS: XY Antibody:Negative (08/05 1047)  Anatomic [place] Complete 12/7 Rubella: 5.49 (08/05 1047) Varicella: Immune  GTT Early:99 Third trimester: 79 RPR: Non Reactive (08/05 1047)   Rhogam N/A HBsAg: Negative (08/05 1047)   TDaP vaccine                       Flu Shot: HIV: Non Reactive (08/05 1047)   Baby Food                                GBS:   Contraception  Pap: 04/13/2018 NIL  CBB     CS/VBAC ?   Support Person            Maternal obesity, antepartum 07/29/2018 by [name], MD No   History of cesarean section complicating pregnancy [id number] by [name], MD No   Overview Signed 08/25/2018  9:25  AM by [name], MD    Transverse lie G2      History of gestational hypertension 07/29/2018 by [name], MD No       Gestational age appropriate obstetric precautions including but not limited to vaginal bleeding, contractions, leaking of fluid and fetal movement were reviewed in detail with the patient.    Return in about 2 weeks (around 02/22/2019) for ROB.  [name], MD, [facility], [facility] 02/08/2019, 11:43 AM

## 2019-02-08 NOTE — Progress Notes (Signed)
ROB Growth scan today TDAP given

## 2019-02-22 ENCOUNTER — Other Ambulatory Visit: Payer: Self-pay

## 2019-02-22 ENCOUNTER — Ambulatory Visit (INDEPENDENT_AMBULATORY_CARE_PROVIDER_SITE_OTHER): Payer: No Typology Code available for payment source | Admitting: Obstetrics and Gynecology

## 2019-02-22 VITALS — BP 122/72 | Wt 249.0 lb

## 2019-02-22 DIAGNOSIS — O0993 Supervision of high risk pregnancy, unspecified, third trimester: Secondary | ICD-10-CM

## 2019-02-22 DIAGNOSIS — Z3480 Encounter for supervision of other normal pregnancy, unspecified trimester: Secondary | ICD-10-CM

## 2019-02-22 DIAGNOSIS — O99210 Obesity complicating pregnancy, unspecified trimester: Secondary | ICD-10-CM

## 2019-02-22 DIAGNOSIS — Z8759 Personal history of other complications of pregnancy, childbirth and the puerperium: Secondary | ICD-10-CM

## 2019-02-22 DIAGNOSIS — Z3A33 33 weeks gestation of pregnancy: Secondary | ICD-10-CM

## 2019-02-22 DIAGNOSIS — O34219 Maternal care for unspecified type scar from previous cesarean delivery: Secondary | ICD-10-CM

## 2019-02-22 NOTE — Progress Notes (Signed)
ROB Dizziness

## 2019-02-22 NOTE — Progress Notes (Signed)
Routine Prenatal Care Visit  Subjective  Breanna Lamb is a 29 y.o. G3P2002 at [redacted]w[redacted]d being seen today for ongoing prenatal care.  She is currently monitored for the following issues for this high-risk pregnancy and has Premenstrual dysphoric syndrome; Chronic tension-type headache, not intractable; Hypoglycemia; Supervision of other normal pregnancy, antepartum; Maternal obesity, antepartum; History of cesarean section complicating pregnancy; History of gestational hypertension; Genital herpes simplex virus (HSV) infection in mother affecting pregnancy; and Low-lying placenta on their problem list.  ----------------------------------------------------------------------------------- Patient reports no complaints.  Some dizzyness Contractions: Not present. Vag. Bleeding: None.  Movement: Present. Denies leaking of fluid.  ----------------------------------------------------------------------------------- The following portions of the patient's history were reviewed and updated as appropriate: allergies, current medications, past family history, past medical history, past social history, past surgical history and problem list. Problem list updated.   Objective  Blood pressure 122/72, weight 249 lb (112.9 kg), last menstrual period 06/26/2018. Pregravid weight 225 lb (102.1 kg) Total Weight Gain 24 lb (10.9 kg)  Body mass index is 47.05 kg/m.\  Urinalysis:      Fetal Status: Fetal Heart Rate (bpm): 135 Fundal Height: 34 cm Movement: Present  Presentation: Vertex  General:  Alert, oriented and cooperative. Patient is in no acute distress.  Skin: Skin is warm and dry. No rash noted.   Cardiovascular: Normal heart rate noted  Respiratory: Normal respiratory effort, no problems with respiration noted  Abdomen: Soft, gravid, appropriate for gestational age. Pain/Pressure: Absent     Pelvic:  Cervical exam deferred        Extremities: Normal range of motion.     ental Status: Normal mood and  affect. Normal behavior. Normal judgment and thought content.     Assessment   29 y.o. G3P2002 at [redacted]w[redacted]d by  04/12/2019, by Ultrasound presenting for routine prenatal visit  Plan   Pregnancy#3 Problems (from 06/26/18 to present)    Problem Noted Resolved   Low-lying placenta 11/19/2018 by [name], MD No   Overview Signed 12/14/2018  4:49 PM by [name], MD    Resolved 23 week anatomy follow up      Supervision of other normal pregnancy, antepartum 07/29/2018 by [name], MD No   Overview Addendum 01/12/2019 11:00 AM by [name], MD    [facility] Prenatal Labs  Dating 5 week [place] Blood type: A/Positive/-- (08/05 1047)   Genetic Screen Inheritest:SMA neg, CF neg, Fragile-X neg  NIPS: XY Antibody:Negative (08/05 1047)  Anatomic [place] Complete 12/7 Rubella: 5.49 (08/05 1047) Varicella: Immune  GTT Early:99 Third trimester: 79 RPR: Non Reactive (08/05 1047)   Rhogam N/A HBsAg: Negative (08/05 1047)   TDaP vaccine                       Flu Shot: HIV: Non Reactive (08/05 1047)   Baby Food                                GBS:   Contraception  Pap: 04/13/2018 NIL  CBB     CS/VBAC ?   Support Person            Maternal obesity, antepartum 07/29/2018 by [name], MD No   History of cesarean section complicating pregnancy [id number] by [name], MD No   Overview Signed 08/25/2018  9:25 AM by [name], MD    Transverse lie G2      History of gestational  hypertension 07/29/2018 by [name], MD No       Gestational age appropriate obstetric precautions including but not limited to vaginal bleeding, contractions, leaking of fluid and fetal movement were reviewed in detail with the patient.    Caval compression syndrome - discussed etiology and possible means of decreasing symptoms  Return in about 2 weeks (around 03/08/2019) for ROB.  [name], MD, [facility], [facility] 02/22/2019, 5:11 PM

## 2019-03-01 ENCOUNTER — Other Ambulatory Visit: Payer: Self-pay

## 2019-03-01 ENCOUNTER — Ambulatory Visit (INDEPENDENT_AMBULATORY_CARE_PROVIDER_SITE_OTHER): Payer: No Typology Code available for payment source | Admitting: Certified Nurse Midwife

## 2019-03-01 ENCOUNTER — Encounter: Payer: Self-pay | Admitting: Certified Nurse Midwife

## 2019-03-01 VITALS — BP 120/70

## 2019-03-01 DIAGNOSIS — B373 Candidiasis of vulva and vagina: Secondary | ICD-10-CM

## 2019-03-01 DIAGNOSIS — Z3A34 34 weeks gestation of pregnancy: Secondary | ICD-10-CM

## 2019-03-01 DIAGNOSIS — N898 Other specified noninflammatory disorders of vagina: Secondary | ICD-10-CM

## 2019-03-01 DIAGNOSIS — B3731 Acute candidiasis of vulva and vagina: Secondary | ICD-10-CM

## 2019-03-01 DIAGNOSIS — O26893 Other specified pregnancy related conditions, third trimester: Secondary | ICD-10-CM

## 2019-03-01 LAB — POCT URINALYSIS DIPSTICK OB
Glucose, UA: NEGATIVE
POC,PROTEIN,UA: NEGATIVE

## 2019-03-01 MED ORDER — FLUCONAZOLE 150 MG PO TABS: ORAL_TABLET | ORAL | 0 refills | Status: DC

## 2019-03-01 NOTE — Telephone Encounter (Signed)
Patient is schedule 03/01/19 with [facility]

## 2019-03-01 NOTE — Progress Notes (Signed)
Acute work in with brown discharge at [redacted] weeks gestation. Has been having brown discharge for a month, but on Friday/ Saturday the brown discharge and clear fluid with it covered most of a large Kotex. Had cramping on Saturday, but also had an ultrasound at [place] which showed the baby to be in breech presentation. Baby has been moving well. Has also felt pressure on her bladder like she has to urinate all the time, but she is unable to urinate at times.  No recent IC. No vulvovaginal itching.  FHTs 150s, breech/transverse with fetal head on maternal right  Pelvic exam: External/BUS: no lesions or inflammation Vagina: off white mucoepithelial discharge, no bleeding Cervix: Closed/ thick/OOP Wet prep positive for hyphae and negative for clue cells and Trich  A: Monilial vaginitis  P: Diflucan 150 mgm every 3 days x 2 doses Follow up next week as scheduled.

## 2019-03-01 NOTE — Progress Notes (Signed)
C/o leaking fluid; doesn't feel right; brown d/c.rj

## 2019-03-09 ENCOUNTER — Other Ambulatory Visit: Payer: Self-pay

## 2019-03-09 ENCOUNTER — Ambulatory Visit (INDEPENDENT_AMBULATORY_CARE_PROVIDER_SITE_OTHER): Payer: No Typology Code available for payment source | Admitting: Advanced Practice Midwife

## 2019-03-09 ENCOUNTER — Encounter: Payer: Self-pay | Admitting: Advanced Practice Midwife

## 2019-03-09 VITALS — BP 124/78 | Wt 250.0 lb

## 2019-03-09 DIAGNOSIS — O09893 Supervision of other high risk pregnancies, third trimester: Secondary | ICD-10-CM

## 2019-03-09 DIAGNOSIS — O99213 Obesity complicating pregnancy, third trimester: Secondary | ICD-10-CM

## 2019-03-09 DIAGNOSIS — O0990 Supervision of high risk pregnancy, unspecified, unspecified trimester: Secondary | ICD-10-CM

## 2019-03-09 DIAGNOSIS — O34211 Maternal care for low transverse scar from previous cesarean delivery: Secondary | ICD-10-CM

## 2019-03-09 DIAGNOSIS — Z0289 Encounter for other administrative examinations: Secondary | ICD-10-CM

## 2019-03-09 DIAGNOSIS — Z3A35 35 weeks gestation of pregnancy: Secondary | ICD-10-CM

## 2019-03-09 NOTE — Progress Notes (Signed)
Routine Prenatal Care Visit  Subjective  Breanna Lamb is a 29 y.o. G3P2002 at [redacted]w[redacted]d being seen today for ongoing prenatal care.  Breanna Lamb is currently monitored for the following issues for this high-risk pregnancy and has Premenstrual dysphoric syndrome; Chronic tension-type headache, not intractable; Hypoglycemia; Supervision of high-risk pregnancy; Maternal obesity, antepartum; History of cesarean section complicating pregnancy; History of gestational hypertension; and Genital herpes simplex virus (HSV) infection in mother affecting pregnancy on their problem list.  ----------------------------------------------------------------------------------- Patient reports some lower extremity swelling. Breanna Lamb has had some lightheaded feeling and wonders if her blood sugar is low. Recommended Breanna Lamb stay well hydrated and have protein with each meal/snack. Breanna Lamb is open to the idea of VBAC if Breanna Lamb goes into labor before c/s and otherwise will keep scheduled c/s date.    Contractions: Not present. Vag. Bleeding: None.  Movement: Present. Leaking Fluid denies.  ----------------------------------------------------------------------------------- The following portions of the patient's history were reviewed and updated as appropriate: allergies, current medications, past family history, past medical history, past social history, past surgical history and problem list. Problem list updated.  Objective  Blood pressure 124/78, weight 250 lb (113.4 kg), last menstrual period 06/26/2018. Pregravid weight 225 lb (102.1 kg) Total Weight Gain 25 lb (11.3 kg) Urinalysis: Urine Protein negative  Urine Glucose negative  Fetal Status: Fetal Heart Rate (bpm): 130 Fundal Height: 39 cm Movement: Present  Heart tones in upper right quadrant (patient reports baby is breech)  General:  Alert, oriented and cooperative. Patient is in no acute distress.  Skin: Skin is warm and dry. No rash noted.   Cardiovascular: Normal heart rate  noted  Respiratory: Normal respiratory effort, no problems with respiration noted  Abdomen: Soft, gravid, appropriate for gestational age. Pain/Pressure: Absent     Pelvic:  Cervical exam deferred        Extremities: Normal range of motion.     Mental Status: Normal mood and affect. Normal behavior. Normal judgment and thought content.   Assessment   29 y.o. G3P2002 at [redacted]w[redacted]d by  04/12/2019, by Ultrasound presenting for routine prenatal visit  Plan   Pregnancy#3 Problems (from 06/26/18 to present)    Problem Noted Resolved   Supervision of high-risk pregnancy 07/29/2018 by [name], MD No   Overview Addendum 01/12/2019 11:00 AM by [name], MD    [facility] Prenatal Labs  Dating 5 week [place] Blood type: A/Positive/-- (08/05 1047)   Genetic Screen Inheritest:SMA neg, CF neg, Fragile-X neg  NIPS: XY Antibody:Negative (08/05 1047)  Anatomic [place] Complete 12/7 Rubella: 5.49 (08/05 1047) Varicella: Immune  GTT Early:99 Third trimester: 79 RPR: Non Reactive (08/05 1047)   Rhogam N/A HBsAg: Negative (08/05 1047)   TDaP vaccine                       Flu Shot: HIV: Non Reactive (08/05 1047)   Baby Food                                GBS:   Contraception  Pap: 04/13/2018 NIL  CBB     CS/VBAC ?   Support Person            Maternal obesity, antepartum 07/29/2018 by [name], MD No   History of cesarean section complicating pregnancy [id number] by [name], MD No   Overview Signed 08/25/2018  9:25 AM by [name], MD    Transverse lie G2  History of gestational hypertension 07/29/2018 by [name], MD No   Low-lying placenta 11/19/2018 by [name], MD 03/01/2019 by Dalia Heading, CNM   Overview Signed 12/14/2018  4:49 PM by [name], MD    Resolved 23 week anatomy follow up          Preterm labor symptoms and general obstetric precautions including but not limited to vaginal bleeding, contractions, leaking of fluid  and fetal movement were reviewed in detail with the patient.   Return in about 1 week (around 03/16/2019) for growth/afi/nst/rob.  Rod Can, CNM 03/09/2019 12:07 PM

## 2019-03-09 NOTE — Progress Notes (Signed)
No vb. No lof.  

## 2019-03-16 ENCOUNTER — Ambulatory Visit (INDEPENDENT_AMBULATORY_CARE_PROVIDER_SITE_OTHER): Payer: No Typology Code available for payment source | Admitting: Obstetrics and Gynecology

## 2019-03-16 ENCOUNTER — Other Ambulatory Visit: Payer: Self-pay

## 2019-03-16 ENCOUNTER — Ambulatory Visit (INDEPENDENT_AMBULATORY_CARE_PROVIDER_SITE_OTHER): Payer: No Typology Code available for payment source

## 2019-03-16 VITALS — BP 118/74 | Wt 251.0 lb

## 2019-03-16 DIAGNOSIS — O0990 Supervision of high risk pregnancy, unspecified, unspecified trimester: Secondary | ICD-10-CM

## 2019-03-16 DIAGNOSIS — O34219 Maternal care for unspecified type scar from previous cesarean delivery: Secondary | ICD-10-CM

## 2019-03-16 DIAGNOSIS — Z362 Encounter for other antenatal screening follow-up: Secondary | ICD-10-CM | POA: Diagnosis not present

## 2019-03-16 DIAGNOSIS — O99210 Obesity complicating pregnancy, unspecified trimester: Secondary | ICD-10-CM

## 2019-03-16 DIAGNOSIS — Z8759 Personal history of other complications of pregnancy, childbirth and the puerperium: Secondary | ICD-10-CM

## 2019-03-16 DIAGNOSIS — Z3A36 36 weeks gestation of pregnancy: Secondary | ICD-10-CM

## 2019-03-16 DIAGNOSIS — O99213 Obesity complicating pregnancy, third trimester: Secondary | ICD-10-CM | POA: Diagnosis not present

## 2019-03-16 DIAGNOSIS — O09893 Supervision of other high risk pregnancies, third trimester: Secondary | ICD-10-CM

## 2019-03-16 DIAGNOSIS — Z3685 Encounter for antenatal screening for Streptococcus B: Secondary | ICD-10-CM

## 2019-03-16 LAB — FETAL NONSTRESS TEST

## 2019-03-16 MED ORDER — VALACYCLOVIR HCL 500 MG PO TABS: 500.0000 mg | ORAL_TABLET | Freq: Two times a day (BID) | ORAL | 6 refills | Status: DC

## 2019-03-16 NOTE — Progress Notes (Signed)
ROB Growth scan today GBS NST

## 2019-03-16 NOTE — Lactation Note (Signed)
Lactation Consultation Note  Patient Name: Breanna Lamb [id number] Date: 03/16/2019      Lactation student discussed benefits of breastfeeding per the [employer] curriculum. [name] encouraged to review breastfeeding information on Ready, set, [employer] site and given information for virtual breastfeeding classes.     Consult Status      [name] 03/16/2019, 3:38 PM

## 2019-03-16 NOTE — Progress Notes (Signed)
Routine Prenatal Care Visit  Subjective  Breanna Lamb is a 29 y.o. G3P2002 at [redacted]w[redacted]d being seen today for ongoing prenatal care.  She is currently monitored for the following issues for this high-risk pregnancy and has Premenstrual dysphoric syndrome; Chronic tension-type headache, not intractable; Hypoglycemia; Supervision of high-risk pregnancy; Maternal obesity, antepartum; History of cesarean section complicating pregnancy; History of gestational hypertension; and Genital herpes simplex virus (HSV) infection in mother affecting pregnancy on their problem list.  ----------------------------------------------------------------------------------- Patient reports no complaints.   Contractions: Not present. Vag. Bleeding: None.  Movement: Present. Denies leaking of fluid.  ----------------------------------------------------------------------------------- The following portions of the patient's history were reviewed and updated as appropriate: allergies, current medications, past family history, past medical history, past social history, past surgical history and problem list. Problem list updated.   Objective  Blood pressure 118/74, weight 251 lb (113.9 kg), last menstrual period 06/26/2018. Pregravid weight 225 lb (102.1 kg) Total Weight Gain 26 lb (11.8 kg) Urinalysis:      Fetal Status: Fetal Heart Rate (bpm): 140   Movement: Present  Presentation: Transverse  General:  Alert, oriented and cooperative. Patient is in no acute distress.  Skin: Skin is warm and dry. No rash noted.   Cardiovascular: Normal heart rate noted  Respiratory: Normal respiratory effort, no problems with respiration noted  Abdomen: Soft, gravid, appropriate for gestational age. Pain/Pressure: Absent     Pelvic:  Cervical exam deferred        Extremities: Normal range of motion.     ental Status: Normal mood and affect. Normal behavior. Normal judgment and thought content.   Baseline: 140 Variability:  moderate Accelerations: present Decelerations: absent Tocometry: N/A The patient was monitored for 30 minutes, fetal heart rate tracing was deemed reactive, category I tracing,  US OB Follow Up  Result Date: 03/16/2019 Patient Name: CHRISTOPHER GIANG DOB: Feb 26, 1990 MRN: [id number] ULTRASOUND REPORT Location: [facility] Date of Service: 03/16/2019 Indications:growth/afi Findings: Nelda [place]se[place] intrauterine pregnancy is visualized with FHR at 153 BPM. Biometrics give an (U/S) Gestational age of [redacted]w[redacted]d and an (U/S) EDD of 04/12/2019; this correlates with the clinically established Estimated Date of Delivery: 04/12/19. Fetal presentation is transverse. Placenta: posterior. Grade: 2 AFI: 19.8 cm Growth percentile is 49.0%.  AC percentile is 76.9%. EFW: 2866 g  ( 6 lb 5 oz ) Impression: 1. [redacted]w[redacted]d Viable Singleton Intrauterine pregnancy previously established criteria. 2. Growth is 49.0 %ile.  AFI is 19.8 cm. Recommendations: 1.Clinical correlation with the patient's History and Physical Exam. Gweneth Dimitri, RT There is a singleton gestation with normal amniotic fluid volume. The fetal biometry correlates with established dating.  Limited fetal anatomy was performed.The visualized fetal anatomical survey appears within normal limits within the resolution of ultrasound as described above.  It must be noted that a normal ultrasound is unable to rule out fetal aneuploidy.  [name], MD, [facility], [facility] 03/16/2019, 3:32 PM     Assessment   29 y.o. G3P2002 at [redacted]w[redacted]d by  04/12/2019, by Ultrasound presenting for routine prenatal visit  Plan   Pregnancy#3 Problems (from 06/26/18 to present)    Problem Noted Resolved   Supervision of high-risk pregnancy 07/29/2018 by [name], MD No   Overview Addendum 01/12/2019 11:00 AM by [name], MD    [facility] Prenatal Labs  Dating 5 week [place] Blood type: A/Positive/-- (08/05 1047)   Genetic Screen Inheritest:SMA neg,  CF neg, Fragile-X neg  NIPS: XY Antibody:Negative (08/05 1047)  Anatomic  [place] Complete 12/7 Rubella: 5.49 (08/05 1047) Varicella: Immune  GTT Early:99 Third trimester: 79 RPR: Non Reactive (08/05 1047)   Rhogam N/A HBsAg: Negative (08/05 1047)   TDaP vaccine                       Flu Shot: HIV: Non Reactive (08/05 1047)   Baby Food                                GBS:   Contraception  Pap: 04/13/2018 NIL  CBB     CS/VBAC ?   Support Person            Maternal obesity, antepartum 07/29/2018 by [name], MD No   History of cesarean section complicating pregnancy [id number] by [name], MD No   Overview Signed 08/25/2018  9:25 AM by [name], MD    Transverse lie G2      History of gestational hypertension 07/29/2018 by [name], MD No   Low-lying placenta 11/19/2018 by [name], MD 03/01/2019 by Dalia Heading, CNM   Overview Signed 12/14/2018  4:49 PM by [name], MD    Resolved 23 week anatomy follow up          Gestational age appropriate obstetric precautions including but not limited to vaginal bleeding, contractions, leaking of fluid and fetal movement were reviewed in detail with the patient.    Return in about 1 week (around 03/23/2019) for ROB NST/AFI.  [name], MD, [facility], [facility] 03/16/2019, 3:29 PM

## 2019-03-18 ENCOUNTER — Observation Stay
Admission: EM | Admit: 2019-03-18 | Discharge: 2019-03-18 | Disposition: A | Discharge status: Home / Self Care | Payer: No Typology Code available for payment source | Attending: Obstetrics and Gynecology | Admitting: Obstetrics and Gynecology

## 2019-03-18 ENCOUNTER — Encounter: Payer: Self-pay | Admitting: Obstetrics and Gynecology

## 2019-03-18 ENCOUNTER — Ambulatory Visit: Payer: No Typology Code available for payment source

## 2019-03-18 ENCOUNTER — Other Ambulatory Visit: Payer: Self-pay

## 2019-03-18 ENCOUNTER — Encounter: Payer: No Typology Code available for payment source | Admitting: Advanced Practice Midwife

## 2019-03-18 DIAGNOSIS — Z3A36 36 weeks gestation of pregnancy: Secondary | ICD-10-CM | POA: Insufficient documentation

## 2019-03-18 DIAGNOSIS — O368130 Decreased fetal movements, third trimester, not applicable or unspecified: Principal | ICD-10-CM | POA: Insufficient documentation

## 2019-03-18 DIAGNOSIS — Z8759 Personal history of other complications of pregnancy, childbirth and the puerperium: Secondary | ICD-10-CM

## 2019-03-18 DIAGNOSIS — O34219 Maternal care for unspecified type scar from previous cesarean delivery: Secondary | ICD-10-CM

## 2019-03-18 DIAGNOSIS — O99210 Obesity complicating pregnancy, unspecified trimester: Secondary | ICD-10-CM

## 2019-03-18 DIAGNOSIS — O0990 Supervision of high risk pregnancy, unspecified, unspecified trimester: Secondary | ICD-10-CM

## 2019-03-18 DIAGNOSIS — O368190 Decreased fetal movements, unspecified trimester, not applicable or unspecified: Secondary | ICD-10-CM | POA: Diagnosis present

## 2019-03-18 LAB — STREP GP B NAA: Strep Gp B NAA: POSITIVE — AB

## 2019-03-18 LAB — OB RESULTS CONSOLE GBS: GBS: POSITIVE

## 2019-03-18 MED ORDER — ACETAMINOPHEN 325 MG PO TABS: 650.0000 mg | ORAL_TABLET | ORAL | Status: DC | PRN

## 2019-03-18 NOTE — Discharge Summary (Signed)
Physician Final Progress Note  Patient ID: Breanna Lamb MRN: [id number] DOB/AGE: [birth year] 29 y.o.  Admit date: 03/18/2019 Admitting provider: [name], MD Discharge date: 03/18/2019   Admission Diagnoses: Decreased fetal movement  Discharge Diagnoses:  Active Problems:   Decreased fetal movement  29 y.o. G3P2002 at [redacted]w[redacted]d by Estimated Date of Delivery: 04/12/19 presenting for decreased fetal movement.  The patient fetus is known to be in the transverse back up position on most recent imaging 03/16/2019, she also had a reactive NST at that visit.  No contraction, no LOF, no VB.  NST was reactive.    Her pregnancy has been uncomplicated other than a history of of C-section for transverse positioning as well as Body mass index is 47.24 kg/m.    Pregnancy#3 Problems (from 06/26/18 to present)    Problem Noted Resolved   Supervision of high-risk pregnancy 07/29/2018 by [name], MD No   Overview Addendum 01/12/2019 11:00 AM by [name], MD    [facility] Prenatal Labs  Dating 5 week [place] Blood type: A/Positive/-- (08/05 1047)   Genetic Screen Inheritest:SMA neg, CF neg, Fragile-X neg  NIPS: XY Antibody:Negative (08/05 1047)  Anatomic [place] Complete 12/7 Rubella: 5.49 (08/05 1047) Varicella: Immune  GTT Early:99 Third trimester: 79 RPR: Non Reactive (08/05 1047)   Rhogam N/A HBsAg: Negative (08/05 1047)   TDaP vaccine                       Flu Shot: HIV: Non Reactive (08/05 1047)   Baby Food                                GBS:   Contraception  Pap: 04/13/2018 NIL  CBB     CS/VBAC ?   Support Person            Maternal obesity, antepartum 07/29/2018 by [name], MD No   History of cesarean section complicating pregnancy [id number] by [name], MD No   Overview Signed 08/25/2018  9:25 AM by [name], MD    Transverse lie G2      History of gestational hypertension 07/29/2018 by [name], MD No   Low-lying placenta 11/19/2018 by  [name], MD 03/01/2019 by Dalia Heading, CNM   Overview Signed 12/14/2018  4:49 PM by [name], MD    Resolved 23 week anatomy follow up         Consults: None  Significant Findings/ Diagnostic Studies: no concerns  Procedures:  Baseline: 120 Variability: moderate Accelerations: present Decelerations: absent Tocometry: none The patient was monitored for 30 minutes, fetal heart rate tracing was deemed reactive, category I tracing,  CPT [phone]   Discharge Condition: good  Disposition:  Discharge disposition: 01-Home or Self Care       Diet: Regular diet  Discharge Activity: Activity as tolerated  Discharge Instructions    Discharge activity:  No Restrictions   Complete by: As directed    Discharge diet:  No restrictions   Complete by: As directed    Fetal Kick Count:  Lie on our left side for one hour after a meal, and count the number of times your baby kicks.  If it is less than 5 times, get up, move around and drink some juice.  Repeat the test 30 minutes later.  If it is still less than 5 kicks in an hour, notify your doctor.   Complete by:  As directed    LABOR:  When conractions begin, you should start to time them from the beginning of one contraction to the beginning  of the next.  When contractions are 5 - 10 minutes apart or less and have been regular for at least an hour, you should call your health care provider.   Complete by: As directed    No sexual activity restrictions   Complete by: As directed    Notify physician for bleeding from the vagina   Complete by: As directed    Notify physician for blurring of vision or spots before the eyes   Complete by: As directed    Notify physician for chills or fever   Complete by: As directed    Notify physician for fainting spells, "black outs" or loss of consciousness   Complete by: As directed    Notify physician for increase in vaginal discharge   Complete by: As directed    Notify  physician for leaking of fluid   Complete by: As directed    Notify physician for pain or burning when urinating   Complete by: As directed    Notify physician for pelvic pressure (sudden increase)   Complete by: As directed    Notify physician for severe or continued nausea or vomiting   Complete by: As directed    Notify physician for sudden gushing of fluid from the vagina (with or without continued leaking)   Complete by: As directed    Notify physician for sudden, constant, or occasional abdominal pain   Complete by: As directed    Notify physician if baby moving less than usual   Complete by: As directed      Allergies as of 03/18/2019      Reactions   Nickel Rash      Medication List    TAKE these medications   aspirin 81 MG chewable tablet Chew by mouth daily.   fluconazole 150 MG tablet Commonly known as: DIFLUCAN Take one every 3 days x 2 doses   multivitamin-prenatal 27-0.8 MG Tabs tablet Take 1 tablet by mouth daily at 12 noon.   valACYclovir 500 MG tablet Commonly known as: VALTREX Take 1 tablet (500 mg total) by mouth 2 (two) times daily.        Total time spent taking care of this patient: 45 minutes  Signed: [name] 03/18/2019, 8:36 PM

## 2019-03-18 NOTE — OB Triage Note (Signed)
Pt is a 28y/o G3P2002 at 110w3d with c/o decreased fetal movement since she woke up this morning. Pt states she has felt the baby move, just not as much as normal. Pt denies LOF, and CTX, but states some on and off back pain. Pt also mentioned dark discharged but was checked recently in the office and MD is aware. Monitors applied and assessing. Initial FHT 125.

## 2019-03-22 ENCOUNTER — Other Ambulatory Visit: Payer: Self-pay

## 2019-03-22 ENCOUNTER — Ambulatory Visit (INDEPENDENT_AMBULATORY_CARE_PROVIDER_SITE_OTHER): Payer: No Typology Code available for payment source

## 2019-03-22 ENCOUNTER — Ambulatory Visit (INDEPENDENT_AMBULATORY_CARE_PROVIDER_SITE_OTHER): Payer: No Typology Code available for payment source | Admitting: Obstetrics and Gynecology

## 2019-03-22 VITALS — BP 116/58 | Wt 252.0 lb

## 2019-03-22 DIAGNOSIS — O99213 Obesity complicating pregnancy, third trimester: Secondary | ICD-10-CM | POA: Diagnosis not present

## 2019-03-22 DIAGNOSIS — O322XX0 Maternal care for transverse and oblique lie, not applicable or unspecified: Secondary | ICD-10-CM | POA: Diagnosis not present

## 2019-03-22 DIAGNOSIS — Z3A37 37 weeks gestation of pregnancy: Secondary | ICD-10-CM

## 2019-03-22 DIAGNOSIS — O34219 Maternal care for unspecified type scar from previous cesarean delivery: Secondary | ICD-10-CM

## 2019-03-22 DIAGNOSIS — O09893 Supervision of other high risk pregnancies, third trimester: Secondary | ICD-10-CM

## 2019-03-22 DIAGNOSIS — O99210 Obesity complicating pregnancy, unspecified trimester: Secondary | ICD-10-CM

## 2019-03-22 DIAGNOSIS — R42 Dizziness and giddiness: Secondary | ICD-10-CM | POA: Diagnosis not present

## 2019-03-22 DIAGNOSIS — O0990 Supervision of high risk pregnancy, unspecified, unspecified trimester: Secondary | ICD-10-CM

## 2019-03-22 DIAGNOSIS — Z8759 Personal history of other complications of pregnancy, childbirth and the puerperium: Secondary | ICD-10-CM

## 2019-03-22 DIAGNOSIS — A6009 Herpesviral infection of other urogenital tract: Secondary | ICD-10-CM

## 2019-03-22 DIAGNOSIS — O98313 Other infections with a predominantly sexual mode of transmission complicating pregnancy, third trimester: Secondary | ICD-10-CM

## 2019-03-22 DIAGNOSIS — O98319 Other infections with a predominantly sexual mode of transmission complicating pregnancy, unspecified trimester: Secondary | ICD-10-CM

## 2019-03-22 LAB — POCT URINALYSIS DIPSTICK OB
Glucose, UA: NEGATIVE
POC,PROTEIN,UA: NEGATIVE

## 2019-03-22 NOTE — Progress Notes (Signed)
Routine Prenatal Care Visit  Subjective  Breanna Lamb is a 29 y.o. G3P2002 at [redacted]w[redacted]d being seen today for ongoing prenatal care.  She is currently monitored for the following issues for this high-risk pregnancy and has Premenstrual dysphoric syndrome; Chronic tension-type headache, not intractable; Hypoglycemia; Supervision of high-risk pregnancy; Maternal obesity, antepartum; History of cesarean section complicating pregnancy; History of gestational hypertension; Genital herpes simplex virus (HSV) infection in mother affecting pregnancy; and Decreased fetal movement on their problem list.  ----------------------------------------------------------------------------------- Patient reports increased dizziness noted by patient Contractions: Irregular. Vag. Bleeding: None.  Movement: Present. Denies leaking of fluid.  ----------------------------------------------------------------------------------- The following portions of the patient's history were reviewed and updated as appropriate: allergies, current medications, past family history, past medical history, past social history, past surgical history and problem list. Problem list updated.   Objective  Blood pressure (!) 116/58, weight 252 lb (114.3 kg), last menstrual period 06/26/2018. Pregravid weight 225 lb (102.1 kg) Total Weight Gain 27 lb (12.2 kg) Urinalysis:      Fetal Status: Fetal Heart Rate (bpm): 130   Movement: Present  Presentation: Transverse  General:  Alert, oriented and cooperative. Patient is in no acute distress.  Skin: Skin is warm and dry. No rash noted.   Cardiovascular: Normal heart rate noted  Respiratory: Normal respiratory effort, no problems with respiration noted  Abdomen: Soft, gravid, appropriate for gestational age. Pain/Pressure: Present     Pelvic:  Cervical exam performed Dilation: Fingertip Effacement (%): 50 Station: -3  Extremities: Normal range of motion.     ental Status: Normal mood and  affect. Normal behavior. Normal judgment and thought content.   Baseline: 130 Variability: moderate Accelerations: present Decelerations: absent Tocometry: n/a The patient was monitored for 30 minutes, fetal heart rate tracing was deemed reactive, category I tracing,  CPT 59025   US OB Limited  Result Date: 03/22/2019 Patient Name: AANYA KOFRON DOB: 1990-04-15 MRN: [id number] ULTRASOUND REPORT Location: [facility] Date of Service: 03/22/2019 Indications:AFI Findings: Nelda [place]se[place] intrauterine pregnancy is visualized with FHR at 122 BPM. Fetal presentation is Transverse. Placenta: fundal. Grade: 2 AFI: 10.2 cm Impression: 1. [redacted]w[redacted]d Viable Singleton Intrauterine pregnancy dated by previously established criteria. 2. AFI is 10.2 cm. Recommendations: 1.Clinical correlation with the patient's History and Physical Exam. Gweneth Dimitri, RT There is a singleton gestation with normal amniotic fluid volume. The visualized fetal anatomical survey appears within normal limits within the resolution of ultrasound as described above.  It must be noted that a normal ultrasound is unable to rule out fetal aneuploidy.  [name], MD, [facility], [facility] 03/22/2019, 8:55 AM   US OB Follow Up  Result Date: 03/16/2019 Patient Name: ALSIE WHEATLEY DOB: January 10, 1990 MRN: [id number] ULTRASOUND REPORT Location: [facility] Date of Service: 03/16/2019 Indications:growth/afi Findings: Nelda Marse[place] intrauterine pregnancy is visualized with FHR at 153 BPM. Biometrics give an (U/S) Gestational age of [redacted]w[redacted]d and an (U/S) EDD of 04/12/2019; this correlates with the clinically established Estimated Date of Delivery: 04/12/19. Fetal presentation is transverse. Placenta: posterior. Grade: 2 AFI: 19.8 cm Growth percentile is 49.0%.  AC percentile is 76.9%. EFW: 2866 g  ( 6 lb 5 oz ) Impression: 1. [redacted]w[redacted]d Viable Singleton Intrauterine pregnancy previously established criteria. 2. Growth is 49.0 %ile.  AFI  is 19.8 cm. Recommendations: 1.Clinical correlation with the patient's History and Physical Exam. Gweneth Dimitri, RT There is a singleton gestation with normal amniotic fluid volume. The fetal biometry correlates with established dating.  Limited fetal anatomy was performed.The visualized fetal anatomical survey appears within normal limits within the resolution of ultrasound as described above.  It must be noted that a normal ultrasound is unable to rule out fetal aneuploidy.  [name], MD, [facility], [facility] 03/16/2019, 3:32 PM     Assessment   29 y.o. G3P2002 at [redacted]w[redacted]d by  04/12/2019, by Ultrasound presenting for routine prenatal visit  Plan   Pregnancy#3 Problems (from 06/26/18 to present)    Problem Noted Resolved   Supervision of high-risk pregnancy 07/29/2018 by [name], MD No   Overview Addendum 01/12/2019 11:00 AM by [name], MD    Clinic [facility] Prenatal Labs  Dating 5 week [place] Blood type: A/Positive/-- (08/05 1047)   Genetic Screen Inheritest:SMA neg, CF neg, Fragile-X neg  NIPS: XY Antibody:Negative (08/05 1047)  Anatomic [place] Complete 12/7 Rubella: 5.49 (08/05 1047) Varicella: Immune  GTT Early:99 Third trimester: 79 RPR: Non Reactive (08/05 1047)   Rhogam N/A HBsAg: Negative (08/05 1047)   TDaP vaccine                       Flu Shot: HIV: Non Reactive (08/05 1047)   Baby Food                                GBS:   Contraception  Pap: 04/13/2018 NIL  CBB     CS/VBAC ?   Support Person            Maternal obesity, antepartum 07/29/2018 by [name], MD No   History of cesarean section complicating pregnancy [id number] by [name], MD No   Overview Signed 08/25/2018  9:25 AM by [name], MD    Transverse lie G2      History of gestational hypertension 07/29/2018 by [name], MD No   Low-lying placenta 11/19/2018 by [name], MD 03/01/2019 by Dalia Heading, CNM   Overview  Signed 12/14/2018  4:49 PM by [name], MD    Resolved 23 week anatomy follow up          Gestational age appropriate obstetric precautions including but not limited to vaginal bleeding, contractions, leaking of fluid and fetal movement were reviewed in detail with the patient.    1) Dizziness - discussed caval compression its pathophysiology as well as supportive measures.  May need to consider out of work early for improvement of symptoms if persist prior to delivery  Return in about 1 week (around 03/29/2019) for [place].  [name], MD, [facility], [facility] 03/22/2019, 9:19 AM

## 2019-03-22 NOTE — Progress Notes (Signed)
ROB AFI/NST 

## 2019-03-23 ENCOUNTER — Observation Stay
Admission: EM | Admit: 2019-03-23 | Discharge: 2019-03-23 | Disposition: A | Discharge status: Home / Self Care | Payer: No Typology Code available for payment source | Attending: Obstetrics and Gynecology | Admitting: Obstetrics and Gynecology

## 2019-03-23 ENCOUNTER — Other Ambulatory Visit: Payer: Self-pay

## 2019-03-23 ENCOUNTER — Other Ambulatory Visit: Payer: Self-pay | Admitting: Obstetrics and Gynecology

## 2019-03-23 ENCOUNTER — Encounter: Payer: Self-pay | Admitting: Obstetrics and Gynecology

## 2019-03-23 DIAGNOSIS — O34219 Maternal care for unspecified type scar from previous cesarean delivery: Secondary | ICD-10-CM

## 2019-03-23 DIAGNOSIS — Z3A00 Weeks of gestation of pregnancy not specified: Secondary | ICD-10-CM | POA: Insufficient documentation

## 2019-03-23 DIAGNOSIS — R42 Dizziness and giddiness: Secondary | ICD-10-CM | POA: Insufficient documentation

## 2019-03-23 DIAGNOSIS — O0990 Supervision of high risk pregnancy, unspecified, unspecified trimester: Secondary | ICD-10-CM

## 2019-03-23 DIAGNOSIS — O99210 Obesity complicating pregnancy, unspecified trimester: Secondary | ICD-10-CM

## 2019-03-23 DIAGNOSIS — R002 Palpitations: Secondary | ICD-10-CM | POA: Insufficient documentation

## 2019-03-23 DIAGNOSIS — O2690 Pregnancy related conditions, unspecified, unspecified trimester: Secondary | ICD-10-CM | POA: Diagnosis present

## 2019-03-23 DIAGNOSIS — R0602 Shortness of breath: Secondary | ICD-10-CM | POA: Diagnosis not present

## 2019-03-23 DIAGNOSIS — Z8759 Personal history of other complications of pregnancy, childbirth and the puerperium: Secondary | ICD-10-CM

## 2019-03-23 DIAGNOSIS — Z3A37 37 weeks gestation of pregnancy: Secondary | ICD-10-CM

## 2019-03-23 NOTE — Discharge Summary (Signed)
RN went to give patient discharge instructions and patient had already left.

## 2019-03-23 NOTE — Discharge Summary (Signed)
Patient presented to L&D with complaint of shortness of breath, even at rest. She also has been having heart palpitations.  She notes feeling dizzy for the last month or two. She was interviewed by the L&D nurse.  She had a slightly low diastolic blood pressure. Otherwise, her vitals were normal.   BP (!) 123/48 (BP Location: Left Arm)   Pulse 99   Temp 98.3 F (36.8 C) (Oral)   Resp 18   Ht 5\' 1"  (1.549 m)   Wt 113.4 kg   LMP 06/26/2018 (Exact Date)   SpO2 99%   BMI 47.24 kg/m    Fetal tracing reviewed by me. Baseline: 135 bpm Variability: moderate Accelerations: present Decelerations: absent Toco: quiet  Given the above constellation of symptoms, she was to be sent to the ER for further workup.  However, when the RN returned to her room for discharge, the patient had already left. To my knowledge, she did not go to the ER.  Prentice Docker, MD, [facility], [facility] 03/23/2019 11:39 PM

## 2019-03-23 NOTE — Progress Notes (Signed)
Spoke with provider about patient chief complaint. Provider stated patient is cleared obstetrically and suggest that the patient go down to the ER to be worked up for her symptoms of SOB, and dizziness. RN communicated the provider's suggestions to the patient and will discharge the patient from birthplace. Pt removed from fetal monitor

## 2019-03-23 NOTE — Discharge Instructions (Signed)
Report to the Emergency Department for shortness of breath and dizziness work-up.

## 2019-03-23 NOTE — OB Triage Note (Signed)
Pt presents c/o "dizziness for the last month or two". Pt also c/o "SOB even at rest". Pt states she feels like she is "having heart palpitations as well". Pt states she has been having braxton hicks ctx for the last week. Pt denies bleeding or LOF. Reports positive fetal movement. Vitals WNL, with slightly lower diastolic blood pressure. Will continue to monitor.

## 2019-03-24 ENCOUNTER — Ambulatory Visit (INDEPENDENT_AMBULATORY_CARE_PROVIDER_SITE_OTHER): Payer: No Typology Code available for payment source | Admitting: Cardiovascular Disease

## 2019-03-24 ENCOUNTER — Encounter: Payer: Self-pay | Admitting: Cardiovascular Disease

## 2019-03-24 ENCOUNTER — Ambulatory Visit
Admission: RE | Admit: 2019-03-24 | Discharge: 2019-03-24 | Disposition: A | Discharge status: Home / Self Care | Payer: No Typology Code available for payment source | Attending: Obstetrics and Gynecology | Admitting: Obstetrics and Gynecology

## 2019-03-24 ENCOUNTER — Encounter: Payer: Self-pay | Admitting: Obstetrics and Gynecology

## 2019-03-24 ENCOUNTER — Ambulatory Visit: Payer: No Typology Code available for payment source | Admitting: Cardiovascular Disease

## 2019-03-24 ENCOUNTER — Ambulatory Visit
Admission: RE | Admit: 2019-03-24 | Discharge: 2019-03-24 | Disposition: A | Discharge status: Home / Self Care | Payer: No Typology Code available for payment source | Source: Ambulatory Visit | Attending: Obstetrics and Gynecology | Admitting: Obstetrics and Gynecology

## 2019-03-24 ENCOUNTER — Other Ambulatory Visit (INDEPENDENT_AMBULATORY_CARE_PROVIDER_SITE_OTHER): Payer: No Typology Code available for payment source

## 2019-03-24 VITALS — BP 110/68 | HR 101 | Ht 61.0 in | Wt 251.1 lb

## 2019-03-24 DIAGNOSIS — Z3A37 37 weeks gestation of pregnancy: Secondary | ICD-10-CM | POA: Diagnosis present

## 2019-03-24 DIAGNOSIS — O0990 Supervision of high risk pregnancy, unspecified, unspecified trimester: Secondary | ICD-10-CM | POA: Insufficient documentation

## 2019-03-24 DIAGNOSIS — R0602 Shortness of breath: Secondary | ICD-10-CM

## 2019-03-24 DIAGNOSIS — R42 Dizziness and giddiness: Secondary | ICD-10-CM

## 2019-03-24 DIAGNOSIS — I479 Paroxysmal tachycardia, unspecified: Secondary | ICD-10-CM | POA: Diagnosis not present

## 2019-03-24 LAB — ECHOCARDIOGRAM COMPLETE
Height: 61 in
Weight: 4018 oz

## 2019-03-24 NOTE — Progress Notes (Signed)
Cardiology Office Note  Date:  03/24/2019   ID:  [name], DOB [birth year], MRN [id number]  PCP:  [name], DO   Chief Complaint  Patient presents with  . OTHER    Palpitations/Dizziness. Meds reviewed verbally with pt.    HPI:  Breanna Lamb is a 29 year old woman G3P2002 at [redacted]w[redacted]d C-section planned Who presents by referral from Dr. Georgianne Fick for shortness of breath, tachycardia  She reports having relatively uncomplicated pregnancy Reports having some dizziness over the past month or 2  Shortness of breath even at rest , heart palpitations More recently has appreciated shortness of breath, worsening dizziness when upright with palpitations Was seen in the office Blood pressure 123/48 yesterday pulse 99 Sent to the emergency room for further work-up,   She reported in a message to OB/GYN a heart rate 124 at rest X-ray ordered performed today No acute findings, no pulmonary edema  Seen by OB/GYN March 22, 2019 Blood pressure at that time 116/58 Was dizzy at that time, talked about taking her out of work  On today's visit continues to have dizziness and tachycardia Orthostatics checked today  Supine blood pressure 120/78 heart rate 99 Sitting 120/64 heart rate 112 felt dizzy short of breath Standing 116/72 heart rate 114 dizzy with shortness of breath Standing after 3 minutes 128/76 heart rate 128 dizzy and short of breath  EKG personally reviewed by myself on todays visit Shows sinus tachycardia rate 100 bpm no significant ST-T wave changes  PMH:   has a past medical history of Allergic rhinitis, Asthma, Benign positional vertigo, Bunion, Contraceptive management, Depression, Genital herpes, Henoch-Schonlein purpura ([employer]), Low-lying placenta (11/19/2018), Lymphadenopathy, Neck pain, Obesity, Ovarian cyst, right, Poor concentration, Premenstrual dysphoric syndrome, Premenstrual dysphoric syndrome, Screening for venereal disease, and Skin lesion.  PSH:     Past Surgical History:  Procedure Laterality Date  . BUNIONECTOMY  2010 and 2011  . CESAREAN SECTION    . mirena IUD  01/2014  . OVARIAN CYST REMOVAL  August 2014    Current Outpatient Medications  Medication Sig Dispense Refill  . acetaminophen (TYLENOL) 325 MG tablet Take 650 mg by mouth every 6 (six) hours as needed for moderate pain.    . Prenatal Vit-Fe Fumarate-FA (MULTIVITAMIN-PRENATAL) 27-0.8 MG TABS tablet Take 1 tablet by mouth daily at 12 noon.    . valACYclovir (VALTREX) 500 MG tablet Take 1 tablet (500 mg total) by mouth 2 (two) times daily. 60 tablet 6   No current facility-administered medications for this visit.    Allergies:   Nickel   Social History:  The patient  reports that she has never smoked. She has never used smokeless tobacco. She reports that she does not drink alcohol or use drugs.   Family History:   family history includes COPD in her maternal grandmother; Cancer in her paternal grandmother; Diabetes in her maternal grandmother; Heart attack in her maternal grandfather; Heart disease in her maternal grandfather and maternal grandmother; Hyperlipidemia in her maternal grandmother; Hypertension in her brother, father, maternal grandmother, mother, and sister; Kidney disease in her maternal grandmother; Mental illness in her maternal grandmother, mother, and sister.    Review of Systems: Review of Systems  Constitutional: Negative.   HENT: Negative.   Respiratory: Negative.   Cardiovascular: Negative.   Gastrointestinal: Negative.   Musculoskeletal: Negative.   Neurological: Negative.   Psychiatric/Behavioral: Negative.   All other systems reviewed and are negative.    PHYSICAL EXAM: VS:  BP 110/68 (BP Location:  Right Arm, Patient Position: Sitting, Cuff Size: Large)   Pulse (!) 101   Ht 5\' 1"  (1.549 m)   Wt 251 lb 2 oz (113.9 kg)   LMP 06/26/2018 (Exact Date)   SpO2 98%   BMI 47.45 kg/m  , BMI Body mass index is 47.45 kg/m. GEN: Well  nourished, well developed, in no acute distress HEENT: normal Neck: no JVD, carotid bruits, or masses Cardiac: RRR; no murmurs, rubs, or gallops,no edema  Respiratory:  clear to auscultation bilaterally, normal work of breathing GI: soft, nontender, nondistended, + BS Distended abdomen Breanna: no deformity or atrophy Skin: warm and dry, no rash Neuro:  Strength and sensation are intact Psych: euthymic mood, full affect  Recent Labs: 01/09/2019: ALT 10; BUN 7; Creatinine, Ser 0.48; Potassium 3.7; Sodium 139 01/11/2019: Hemoglobin 12.0; Platelets 261    Lipid Panel Lab Results  Component Value Date   CHOL 205 (H) 06/17/2017   HDL 47 06/17/2017   LDLCALC 130 (H) 06/17/2017   TRIG 141 06/17/2017      Wt Readings from Last 3 Encounters:  03/24/19 251 lb 2 oz (113.9 kg)  03/23/19 250 lb (113.4 kg)  03/22/19 252 lb (114.3 kg)     ASSESSMENT AND PLAN:  Problem List Items Addressed This Visit    None    Visit Diagnoses    Dizziness    -  Primary   Relevant Orders   EKG 12-Lead   TSH   Shortness of breath       Relevant Orders   EKG 12-Lead   ECHOCARDIOGRAM COMPLETE (Completed)   TSH   Paroxysmal tachycardia (HCC)         Etiology of her shortness of breath, dizziness, tachycardia likely secondary to late stages of pregnancy -Blood pressure stable on orthostatics There is change in heart rate consistent with appropriate response to change in position -Echocardiogram performed on today's visit showing normal LV function, no RV dilation concerning for high right-sided pressures or PE No signs of pulmonary edema given chest x-ray and echocardiogram findings -She reports that she has been taken out of work Recommend she touch base with OB/GYN Reports that she is scheduled for scheduled C-section end of the month  Disposition:   F/U  12 months   Total encounter time more than 45 minutes  Greater than 50% was spent in counseling and coordination of care with the  patient    Signed, Esmond Plants, M.D., Ph.D. [place], [place]

## 2019-03-24 NOTE — Patient Instructions (Signed)
Medication Instructions:  No changes  *If you need a refill on your cardiac medications before your next appointment, please call your pharmacy*   Lab Work: TSH to be done today  If you have labs (blood work) drawn today and your tests are completely normal, you will receive your results only by: Marland Kitchen MyChart Message (if you have MyChart) OR . A paper copy in the mail If you have any lab test that is abnormal or we need to change your treatment, we will call you to review the results.   Testing/Procedures: Your physician has requested that you have an echocardiogram. Echocardiography is a painless test that uses sound waves to create images of your heart. It provides your doctor with information about the size and shape of your heart and how well your heart's chambers and valves are working. This procedure takes approximately one hour. There are no restrictions for this procedure.  We will call you with results.    Follow-Up: At [facility], you and your health needs are our priority.  As part of our continuing mission to provide you with exceptional heart care, we have created designated Provider Care Teams.  These Care Teams include your primary Cardiologist (physician) and Advanced Practice Providers (APPs -  Physician Assistants and Nurse Practitioners) who all work together to provide you with the care you need, when you need it.  We recommend signing up for the patient portal called "MyChart".  Sign up information is provided on this After Visit Summary.  MyChart is used to connect with patients for Virtual Visits (Telemedicine).  Patients are able to view lab/test results, encounter notes, upcoming appointments, etc.  Non-urgent messages can be sent to your provider as well.   To learn more about what you can do with MyChart, go to [email or web address].    Your next appointment:   Follow up as needed.

## 2019-03-25 ENCOUNTER — Ambulatory Visit: Payer: No Typology Code available for payment source | Admitting: Cardiovascular Disease

## 2019-03-25 LAB — TSH: TSH: 1.12 u[IU]/mL (ref 0.450–4.500)

## 2019-03-26 ENCOUNTER — Emergency Department
Admission: EM | Admit: 2019-03-26 | Discharge: 2019-03-27 | Disposition: A | Discharge status: Home / Self Care | Payer: No Typology Code available for payment source | Attending: Emergency Medicine | Admitting: Emergency Medicine

## 2019-03-26 ENCOUNTER — Other Ambulatory Visit: Payer: Self-pay

## 2019-03-26 ENCOUNTER — Emergency Department: Payer: No Typology Code available for payment source

## 2019-03-26 DIAGNOSIS — Z20822 Contact with and (suspected) exposure to covid-19: Secondary | ICD-10-CM | POA: Diagnosis not present

## 2019-03-26 DIAGNOSIS — R002 Palpitations: Secondary | ICD-10-CM | POA: Diagnosis not present

## 2019-03-26 DIAGNOSIS — J45909 Unspecified asthma, uncomplicated: Secondary | ICD-10-CM | POA: Diagnosis not present

## 2019-03-26 DIAGNOSIS — O99891 Other specified diseases and conditions complicating pregnancy: Secondary | ICD-10-CM | POA: Diagnosis present

## 2019-03-26 DIAGNOSIS — Z3A37 37 weeks gestation of pregnancy: Secondary | ICD-10-CM | POA: Diagnosis not present

## 2019-03-26 DIAGNOSIS — Z79899 Other long term (current) drug therapy: Secondary | ICD-10-CM | POA: Diagnosis not present

## 2019-03-26 DIAGNOSIS — R0602 Shortness of breath: Secondary | ICD-10-CM | POA: Diagnosis not present

## 2019-03-26 DIAGNOSIS — O99513 Diseases of the respiratory system complicating pregnancy, third trimester: Secondary | ICD-10-CM | POA: Diagnosis not present

## 2019-03-26 DIAGNOSIS — Z3493 Encounter for supervision of normal pregnancy, unspecified, third trimester: Secondary | ICD-10-CM

## 2019-03-26 LAB — FIBRIN DERIVATIVES D-DIMER (ARMC ONLY): Fibrin derivatives D-dimer (ARMC): 1133.98 ng/mL (FEU) — ABNORMAL HIGH (ref 0.00–499.00)

## 2019-03-26 LAB — COMPREHENSIVE METABOLIC PANEL
ALT: 12 U/L (ref 0–44)
AST: 16 U/L (ref 15–41)
Albumin: 3.3 g/dL — ABNORMAL LOW (ref 3.5–5.0)
Alkaline Phosphatase: 120 U/L (ref 38–126)
Anion gap: 11 (ref 5–15)
BUN: 7 mg/dL (ref 6–20)
CO2: 18 mmol/L — ABNORMAL LOW (ref 22–32)
Calcium: 9.1 mg/dL (ref 8.9–10.3)
Chloride: 109 mmol/L (ref 98–111)
Creatinine, Ser: 0.51 mg/dL (ref 0.44–1.00)
GFR calc Af Amer: >60 mL/min (ref >60)
GFR calc non Af Amer: >60 mL/min (ref >60)
Glucose, Bld: 86 mg/dL (ref 70–99)
Potassium: 3.6 mmol/L (ref 3.5–5.1)
Sodium: 138 mmol/L (ref 135–145)
Total Bilirubin: 1.2 mg/dL (ref 0.3–1.2)
Total Protein: 6.8 g/dL (ref 6.5–8.1)

## 2019-03-26 LAB — CBC WITH DIFFERENTIAL/PLATELET
Abs Immature Granulocytes: 0.11 10*3/uL — ABNORMAL HIGH (ref 0.00–0.07)
Basophils Absolute: 0 10*3/uL (ref 0.0–0.1)
Basophils Relative: 0 %
Eosinophils Absolute: 0.1 10*3/uL (ref 0.0–0.5)
Eosinophils Relative: 1 %
HCT: 38.4 % (ref 36.0–46.0)
Hemoglobin: 12.8 g/dL (ref 12.0–15.0)
Immature Granulocytes: 1 %
Lymphocytes Relative: 21 %
Lymphs Abs: 2.6 10*3/uL (ref 0.7–4.0)
MCH: 28.3 pg (ref 26.0–34.0)
MCHC: 33.3 g/dL (ref 30.0–36.0)
MCV: 84.8 fL (ref 80.0–100.0)
Monocytes Absolute: 1 10*3/uL (ref 0.1–1.0)
Monocytes Relative: 8 %
Neutro Abs: 8.8 10*3/uL — ABNORMAL HIGH (ref 1.7–7.7)
Neutrophils Relative %: 69 %
Platelets: 254 10*3/uL (ref 150–400)
RBC: 4.53 MIL/uL (ref 3.87–5.11)
RDW: 13.6 % (ref 11.5–15.5)
WBC: 12.7 10*3/uL — ABNORMAL HIGH (ref 4.0–10.5)
nRBC: 0 % (ref 0.0–0.2)

## 2019-03-26 MED ORDER — SODIUM CHLORIDE 0.9 % IV BOLUS
500.0000 mL | Freq: Once | INTRAVENOUS | Status: AC
  Administered 2019-03-27: 500 mL via INTRAVENOUS

## 2019-03-26 NOTE — ED Provider Notes (Signed)
[facility] Emergency Department Provider Note  ____________________________________________   First MD Initiated Contact with Patient 03/26/19 2313     (approximate)  I have reviewed the triage vital signs and the nursing notes.   HISTORY  Chief Complaint Shortness of Breath    HPI Breanna Lamb is a 29 y.o. female who is [redacted] weeks pregnant who comes in for shortness of breath for 1 week and palpitations.  Patient was cleared by cardiology.  Patient was sent by OB/GYN for CT to rule out PE.  Patient states that she is already been seen by cardiology and had a echocardiogram, chest x-ray, thyroid studies that were normal.  She states that the baby is in transverse lie.  She had 2 prior pregnancies and had to have an earlydelivery for the second 1 due to elevated blood pressure.  She states that the shortness of breath is intermittent, worse with exertion, laying on certain sides of her body, nothing makes it better, moderate, associated with some palpitations of her heart.  She is got minimal ankle swelling bilaterally but denies any unilateral leg swelling.  Denies any new abdominal pain, loss of fluids, vaginal bleeding.          Past Medical History:  Diagnosis Date  . Allergic rhinitis   . Asthma   . Benign positional vertigo   . Bunion   . Contraceptive management   . Depression   . Genital herpes   . Henoch-Schonlein purpura ([place])   . Low-lying placenta 11/19/2018   Resolved 23 week anatomy follow up  . Lymphadenopathy   . Neck pain   . Obesity   . Ovarian cyst, right   . Poor concentration   . Premenstrual dysphoric syndrome   . Premenstrual dysphoric syndrome   . Screening for venereal disease   . Skin lesion     Patient Active Problem List   Diagnosis Date Noted  . SOB (shortness of breath) 03/23/2019  . Decreased fetal movement 03/18/2019  . Supervision of high-risk pregnancy 07/29/2018  . Maternal obesity, antepartum 07/29/2018   . History of cesarean section complicating pregnancy [id number]  . History of gestational hypertension 07/29/2018  . Genital herpes simplex virus (HSV) infection in mother affecting pregnancy 07/29/2018  . Hypoglycemia 02/24/2018  . Chronic tension-type headache, not intractable 01/31/2016  . Premenstrual dysphoric syndrome     Past Surgical History:  Procedure Laterality Date  . BUNIONECTOMY  2010 and 2011  . CESAREAN SECTION    . mirena IUD  01/2014  . OVARIAN CYST REMOVAL  August 2014    Prior to Admission medications   Medication Sig Start Date End Date Taking? Authorizing Provider  acetaminophen (TYLENOL) 325 MG tablet Take 650 mg by mouth every 6 (six) hours as needed for moderate pain.    [provider]  Prenatal Vit-Fe Fumarate-FA (MULTIVITAMIN-PRENATAL) 27-0.8 MG TABS tablet Take 1 tablet by mouth daily at 12 noon.    [provider]  valACYclovir (VALTREX) 500 MG tablet Take 1 tablet (500 mg total) by mouth 2 (two) times daily. 03/16/19   [name], MD    Allergies Nickel  Family History  Problem Relation Age of Onset  . Hypertension Mother   . Mental illness Mother        Depression, Anxiety  . Hypertension Father   . Hypertension Brother   . Hypertension Maternal Grandmother   . Diabetes Maternal Grandmother   . Hyperlipidemia Maternal Grandmother   . Heart disease Maternal  Grandmother   . COPD Maternal Grandmother   . Kidney disease Maternal Grandmother   . Mental illness Maternal Grandmother   . Hypertension Sister   . Mental illness Sister   . Heart disease Maternal Grandfather   . Heart attack Maternal Grandfather   . Cancer Paternal Grandmother     Social History Social History   Tobacco Use  . Smoking status: Never Smoker  . Smokeless tobacco: Never Used  Substance Use Topics  . Alcohol use: No  . Drug use: No      Review of Systems Constitutional: No fever/chills Eyes: No visual changes. ENT: No sore  throat. Cardiovascular: Denies chest pain.  Positive palpitations Respiratory: Positive shortness of breath Gastrointestinal: No abdominal pain.  No nausea, no vomiting.  No diarrhea.  No constipation. Genitourinary: Negative for dysuria. Musculoskeletal: Negative for back pain. Skin: Negative for rash. Neurological: Negative for headaches, focal weakness or numbness. All other ROS negative ____________________________________________   PHYSICAL EXAM:  VITAL SIGNS: ED Triage Vitals  Enc Vitals Group     BP 03/26/19 1840 (!) 135/47     Pulse Rate 03/26/19 1840 (!) 108     Resp 03/26/19 1840 18     Temp 03/26/19 1840 98.3 F (36.8 C)     Temp Source 03/26/19 1840 Oral     SpO2 03/26/19 1840 97 %     Weight 03/26/19 1841 250 lb (113.4 kg)     Height 03/26/19 1841 5\' 1"  (1.549 m)     Head Circumference --      Peak Flow --      Pain Score 03/26/19 1841 0     Pain Loc --      Pain Edu? --      Excl. in [place]? --     Constitutional: Alert and oriented. Well appearing and in no acute distress. Eyes: Conjunctivae are normal. EOMI. Head: Atraumatic. Nose: No congestion/rhinnorhea. Mouth/Throat: Mucous membranes are moist.   Neck: No stridor. Trachea Midline. FROM Cardiovascular: Tachycardic, regular rhythm. Grossly normal heart sounds.  Good peripheral circulation. Respiratory: Normal respiratory effort.  No retractions. Lungs CTAB. Gastrointestinal: Soft and nontender. No abdominal bruits.  Gravid uterus Musculoskeletal: No lower extremity tenderness nor edema.  No joint effusions. Neurologic:  Normal speech and language. No gross focal neurologic deficits are appreciated.  Skin:  Skin is warm, dry and intact. No rash noted. Psychiatric: Mood and affect are normal. Speech and behavior are normal. GU: Deferred   ____________________________________________   LABS (all labs ordered are listed, but only abnormal results are displayed)  Labs Reviewed  CBC WITH  DIFFERENTIAL/PLATELET - Abnormal; Notable for the following components:      Result Value   WBC 12.7 (*)    Neutro Abs 8.8 (*)    Abs Immature Granulocytes 0.11 (*)    All other components within normal limits  COMPREHENSIVE METABOLIC PANEL - Abnormal; Notable for the following components:   CO2 18 (*)    Albumin 3.3 (*)    All other components within normal limits  FIBRIN DERIVATIVES D-DIMER ([facility] ONLY) - Abnormal; Notable for the following components:   Fibrin derivatives D-dimer ([facility]) 1,133.98 (*)    All other components within normal limits  BRAIN NATRIURETIC PEPTIDE  TROPONIN I (HIGH SENSITIVITY)   ____________________________________________   ED ECG REPORT I, Vanessa [place], the attending physician, personally viewed and interpreted this ECG.  EKG is normal sinus rhythm 99, no ST elevation, T wave inversion in lead III, normal intervals ____________________________________________  RADIOLOGY   Official radiology report(s): US Venous Img Lower Bilateral  Result Date: 03/26/2019 CLINICAL DATA:  Shortness of breath x1 week.  Concern for DVT. EXAM: BILATERAL LOWER EXTREMITY VENOUS DOPPLER ULTRASOUND TECHNIQUE: Gray-scale sonography with graded compression, as well as color Doppler and duplex ultrasound were performed to evaluate the lower extremity deep venous systems from the level of the common femoral vein and including the common femoral, femoral, profunda femoral, popliteal and calf veins including the posterior tibial, peroneal and gastrocnemius veins when visible. The superficial great saphenous vein was also interrogated. Spectral Doppler was utilized to evaluate flow at rest and with distal augmentation maneuvers in the common femoral, femoral and popliteal veins. COMPARISON:  None. FINDINGS: RIGHT LOWER EXTREMITY Common Femoral Vein: No evidence of thrombus. Normal compressibility, respiratory phasicity and response to augmentation. Saphenofemoral Junction: No evidence of  thrombus. Normal compressibility and flow on color Doppler imaging. Profunda Femoral Vein: No evidence of thrombus. Normal compressibility and flow on color Doppler imaging. Femoral Vein: No evidence of thrombus. Normal compressibility, respiratory phasicity and response to augmentation. Popliteal Vein: No evidence of thrombus. Normal compressibility, respiratory phasicity and response to augmentation. Calf Veins: No evidence of thrombus. Normal compressibility and flow on color Doppler imaging. Superficial Great Saphenous Vein: No evidence of thrombus. Normal compressibility. Venous Reflux:  None. Other Findings:  None. LEFT LOWER EXTREMITY Common Femoral Vein: No evidence of thrombus. Normal compressibility, respiratory phasicity and response to augmentation. Saphenofemoral Junction: No evidence of thrombus. Normal compressibility and flow on color Doppler imaging. Profunda Femoral Vein: No evidence of thrombus. Normal compressibility and flow on color Doppler imaging. Femoral Vein: No evidence of thrombus. Normal compressibility, respiratory phasicity and response to augmentation. Popliteal Vein: No evidence of thrombus. Normal compressibility, respiratory phasicity and response to augmentation. Calf Veins: No evidence of thrombus. Normal compressibility and flow on color Doppler imaging. Superficial Great Saphenous Vein: No evidence of thrombus. Normal compressibility. Venous Reflux:  None. Other Findings:  None. IMPRESSION: 1. No DVT. 2. Decreased phasicity bilaterally likely secondary to a gravid uterus. Electronically Signed   By: Constance Holster M.D.   On: 03/26/2019 20:25    ____________________________________________   PROCEDURES  Procedure(s) performed (including Critical Care):  Procedures   ____________________________________________   INITIAL IMPRESSION / ASSESSMENT AND PLAN / ED COURSE  Namiah A Tamala Lamb was evaluated in Emergency Department on 03/26/2019 for the symptoms described  in the history of present illness. She was evaluated in the context of the global COVID-19 pandemic, which necessitated consideration that the patient might be at risk for infection with the SARS-CoV-2 virus that causes COVID-19. Institutional protocols and algorithms that pertain to the evaluation of patients at risk for COVID-19 are in a state of rapid change based on information released by regulatory bodies including the CDC and federal and state organizations. These policies and algorithms were followed during the patient's care in the ED.    Patient is a 29 year old who comes in with shortness of breath.  Will get labs to evaluate for blood clot with D-dimer and DVT studies.  Will get labs to evaluate for anemia.  Good breath sounds bilaterally solicitation for pneumothorax.  Denies any symptoms suggest Covid or pneumonia.  We will proceed with CT PE.   Labs are reassuring.  No evidence of anemia. slight white count elevation but no infectious symptoms.  DVT ultrasounds were negative and D-dimer was elevated so patient was okay with proceeding with CT PE.  We discussed the risk and benefits of CT  PE during pregnancy.  CT PE was negative, cardiac markers are negative, BNP are negative.  Patient looks well-appearing heart rate is trending down.  Patient feels comfortable with being discharged home at this time and can follow-up with her OB/GYN  I discussed the provisional nature of ED diagnosis, the treatment so far, the ongoing plan of care, follow up appointments and return precautions with the patient and any family or support people present. They expressed understanding and agreed with the plan, discharged home.   ____________________________________________   FINAL CLINICAL IMPRESSION(S) / ED DIAGNOSES   Final diagnoses:  SOB (shortness of breath)  Third trimester pregnancy      MEDICATIONS GIVEN DURING THIS VISIT:  Medications  sodium chloride 0.9 % bolus 500 mL (500 mLs  Intravenous New Bag/Given 03/27/19 0005)  iohexol (OMNIPAQUE) 350 MG/ML injection 75 mL (75 mLs Intravenous Contrast Given 03/27/19 0014)     ED Discharge Orders    None       Note:  This document was prepared using Dragon voice recognition software and may include unintentional dictation errors.   Vanessa [place], MD 03/27/19 [phone]

## 2019-03-26 NOTE — ED Triage Notes (Addendum)
Pt to the er for sob x 1 week, and palpitations that started x 1 month ago. Pt has been cleared by cardiologist. Pt sent by OBGYN for CT to rule out PE. Pt denies pain. Pt has dizziness as well and is [redacted] weeks pregnant.

## 2019-03-27 ENCOUNTER — Emergency Department: Payer: No Typology Code available for payment source

## 2019-03-27 LAB — TROPONIN I (HIGH SENSITIVITY): Troponin I (High Sensitivity): <2 ng/L (ref <18)

## 2019-03-27 LAB — SARS CORONAVIRUS 2 (TAT 6-24 HRS): SARS Coronavirus 2: NEGATIVE

## 2019-03-27 LAB — BRAIN NATRIURETIC PEPTIDE: B Natriuretic Peptide: 25 pg/mL (ref 0.0–100.0)

## 2019-03-27 MED ORDER — IOHEXOL 350 MG/ML SOLN
75.0000 mL | Freq: Once | INTRAVENOUS | Status: AC | PRN
  Administered 2019-03-27: 75 mL via INTRAVENOUS

## 2019-03-27 NOTE — Discharge Instructions (Addendum)
Your CT scan was negative and your labs were reassuring.  You should follow-up with your OB/GYN.  Return to the ED for fevers, worsening shortness of breath or any other concerns.  We are testing for coronavirus.  Please remain quarantine at home until your results come back.

## 2019-03-31 ENCOUNTER — Ambulatory Visit (INDEPENDENT_AMBULATORY_CARE_PROVIDER_SITE_OTHER): Payer: No Typology Code available for payment source | Admitting: Obstetrics and Gynecology

## 2019-03-31 ENCOUNTER — Other Ambulatory Visit: Payer: Self-pay

## 2019-03-31 VITALS — BP 128/88 | Wt 254.0 lb

## 2019-03-31 DIAGNOSIS — O0993 Supervision of high risk pregnancy, unspecified, third trimester: Secondary | ICD-10-CM

## 2019-03-31 DIAGNOSIS — O99210 Obesity complicating pregnancy, unspecified trimester: Secondary | ICD-10-CM

## 2019-03-31 DIAGNOSIS — O98313 Other infections with a predominantly sexual mode of transmission complicating pregnancy, third trimester: Secondary | ICD-10-CM

## 2019-03-31 DIAGNOSIS — A6009 Herpesviral infection of other urogenital tract: Secondary | ICD-10-CM

## 2019-03-31 DIAGNOSIS — Z3A38 38 weeks gestation of pregnancy: Secondary | ICD-10-CM

## 2019-03-31 DIAGNOSIS — O34219 Maternal care for unspecified type scar from previous cesarean delivery: Secondary | ICD-10-CM

## 2019-03-31 DIAGNOSIS — O98319 Other infections with a predominantly sexual mode of transmission complicating pregnancy, unspecified trimester: Secondary | ICD-10-CM

## 2019-03-31 DIAGNOSIS — O09893 Supervision of other high risk pregnancies, third trimester: Secondary | ICD-10-CM

## 2019-03-31 DIAGNOSIS — O99213 Obesity complicating pregnancy, third trimester: Secondary | ICD-10-CM

## 2019-03-31 DIAGNOSIS — O322XX0 Maternal care for transverse and oblique lie, not applicable or unspecified: Secondary | ICD-10-CM

## 2019-03-31 NOTE — Progress Notes (Signed)
No vb. No lof. H&P today. Pt t still having some SOB and braxton hicks.

## 2019-03-31 NOTE — Progress Notes (Signed)
Obstetric H&P   Chief Complaint: C-section scheduling   Prenatal Care Provider: [facility]  History of Present Illness: 29 y.o. [id number] 79w3dby 04/12/2019, by Ultrasound presenting today for routine obstetric visit as well as cesarean section consents.  +FM, no LOF, no regular contractions, no vaginal bleeding  Pregnancy notable tor obesity, history of cesarean section for transverse presentation, transverse presentation this pregnancy, HSV, as well as increasing shortness of breath attributed to fetal presentation (negative echo, negative cardiology work up, and negative PE CT).  Patient is scheduled for attempted version 04/01/2019 and repeat cesarean section with tubal ligation 04/07/2019.   Placenta was low lying but resolved on follow up imaging.  Placenta is posterior  Pregravid weight 225 lb (102.1 kg) Total Weight Gain 29 lb (13.2 kg)  Pregnancy#3 Problems (from 06/26/18 to present)    Problem Noted Resolved   Supervision of high-risk pregnancy 07/29/2018 by SMalachy Mood MD No   Overview Addendum 03/31/2019 11:53 AM by SMalachy Mood MHoly CrossPrenatal Labs  Dating 5 week UKoreaBlood type: A/Positive/-- (08/05 1047)   Genetic Screen Inheritest:SMA neg, CF neg, Fragile-X neg  NIPS: XY Antibody:Negative (08/05 1047)  Anatomic UKoreaComplete 12/7 Rubella: 5.49 (08/05 1047) Varicella: Immune  GTT Early:99 Third trimester: 79 RPR: Non Reactive (08/05 1047)   Rhogam N/A HBsAg: Negative (08/05 1047)   TDaP vaccine 02/2019   Flu Shot: at work HIV: Non Reactive (08/05 1047)   Baby Food                                GBS: Positive  Contraception BTL Pap: 04/13/2018 NIL  CBB     CS/VBAC Repeat 3/31   Support Person Husband Mitchel           Maternal obesity, antepartum 07/29/2018 by SMalachy Mood MD No   History of cesarean section complicating pregnancy 78/41/3244by SMalachy Mood MD No   Overview Signed 08/25/2018  9:25 AM by SMalachy Mood MD    Transverse lie  G2      History of gestational hypertension 07/29/2018 by SMalachy Mood MD No   Low-lying placenta 11/19/2018 by SMalachy Mood MD 03/01/2019 by GDalia Heading CNM   Overview Signed 12/14/2018  4:49 PM by SMalachy Mood MD    Resolved 23 week anatomy follow up          Review of Systems: 10 point review of systems negative unless otherwise noted in HPI  Past Medical History: Patient Active Problem List   Diagnosis Date Noted  . SOB (shortness of breath) 03/23/2019  . Decreased fetal movement 03/18/2019  . Supervision of high-risk pregnancy 07/29/2018    Clinic [facility] Prenatal Labs  Dating 5 week UKoreaBlood type: A/Positive/-- (08/05 1047)   Genetic Screen Inheritest:SMA neg, CF neg, Fragile-X neg  NIPS: XY Antibody:Negative (08/05 1047)  Anatomic UK[place]Complete 12/7 Rubella: 5.49 (08/05 1047) Varicella: Immune  GTT Early:99 Third trimester: 79 RPR: Non Reactive (08/05 1047)   Rhogam N/A HBsAg: Negative (08/05 1047)   TDaP vaccine                       Flu Shot: HIV: Non Reactive (08/05 1047)   Baby Food                                GBS:   Contraception  BTL Pap: 04/13/2018 NIL  CBB     CS/VBAC Repeat 3/31   Support Person         . Maternal obesity, antepartum 07/29/2018  . History of cesarean section complicating pregnancy 01/60/1093    Transverse lie G2   . History of gestational hypertension 07/29/2018  . Genital herpes simplex virus (HSV) infection in mother affecting pregnancy 07/29/2018  . Hypoglycemia 02/24/2018  . Chronic tension-type headache, not intractable 01/31/2016  . Premenstrual dysphoric syndrome     Past Surgical History: Past Surgical History:  Procedure Laterality Date  . BUNIONECTOMY  2010 and 2011  . CESAREAN SECTION    . mirena IUD  01/2014  . OVARIAN CYST REMOVAL  August 2014    Past Obstetric History: # 1 - Date: 04/06/10, Sex: Female, Weight: 7 lb (3.175 kg), GA: [redacted]w[redacted]d Delivery: Vaginal, Spontaneous, Apgar1: None, Apgar5:  None, Living: Living, Birth Comments: None  # 2 - Date: 10/05/13, Sex: Female, Weight: 6 lb 14 oz (3.118 kg), GA: 368w4dDelivery: C-Section, Low Transverse, Apgar1: 8, Apgar5: 9, Living: Living, Birth Comments: None  # 3 - Date: None, Sex: None, Weight: None, GA: None, Delivery: None, Apgar1: None, Apgar5: None, Living: None, Birth Comments: None   Past Gynecologic History:  Family History: Family History  Problem Relation Age of Onset  . Hypertension Mother   . Mental illness Mother        Depression, Anxiety  . Hypertension Father   . Hypertension Brother   . Hypertension Maternal Grandmother   . Diabetes Maternal Grandmother   . Hyperlipidemia Maternal Grandmother   . Heart disease Maternal Grandmother   . COPD Maternal Grandmother   . Kidney disease Maternal Grandmother   . Mental illness Maternal Grandmother   . Hypertension Sister   . Mental illness Sister   . Heart disease Maternal Grandfather   . Heart attack Maternal Grandfather   . Cancer Paternal Grandmother     Social History: Social History   Socioeconomic History  . Marital status: Married    Spouse name: Not on file  . Number of children: Not on file  . Years of education: Not on file  . Highest education level: Not on file  Occupational History  . Not on file  Tobacco Use  . Smoking status: Never Smoker  . Smokeless tobacco: Never Used  Substance and Sexual Activity  . Alcohol use: No  . Drug use: No  . Sexual activity: Yes    Birth control/protection: None    Comment: unsure  Other Topics Concern  . Not on file  Social History Narrative  . Not on file   Social Determinants of Health   Financial Resource Strain:   . Difficulty of Paying Living Expenses:   Food Insecurity:   . Worried About RuCharity fundraisern the Last Year:   . RaArboriculturistn the Last Year:   Transportation Needs:   . LaFilm/video editorMedical):   . Marland Kitchenack of Transportation (Non-Medical):   Physical  Activity:   . Days of Exercise per Week:   . Minutes of Exercise per Session:   Stress:   . Feeling of Stress :   Social Connections:   . Frequency of Communication with Friends and Family:   . Frequency of Social Gatherings with Friends and Family:   . Attends Religious Services:   . Active Member of Clubs or Organizations:   . Attends ClArchivisteetings:   . Marland Kitchenarital Status:  Intimate Partner Violence:   . Fear of Current or Ex-Partner:   . Emotionally Abused:   Marland Kitchen Physically Abused:   . Sexually Abused:     Medications: Prior to Admission medications   Medication Sig Start Date End Date Taking? Authorizing Provider  acetaminophen (TYLENOL) 325 MG tablet Take 650 mg by mouth every 6 (six) hours as needed for moderate pain.    [provider]  Prenatal Vit-Fe Fumarate-FA (MULTIVITAMIN-PRENATAL) 27-0.8 MG TABS tablet Take 1 tablet by mouth daily at 12 noon.    [provider]  valACYclovir (VALTREX) 500 MG tablet Take 1 tablet (500 mg total) by mouth 2 (two) times daily. 03/16/19   [name], MD    Allergies: Allergies  Allergen Reactions  . Nickel Rash    Physical Exam: Vitals: Blood pressure 128/88, weight 254 lb (115.2 kg), last menstrual period 06/26/2018.  Urine Dip Protein: N/A  FHT: 140  General: NAD HEENT: normocephalic, anicteric Pulmonary: No increased work of breathing Cardiovascular: RRR, distal pulses 2+ Abdomen: Gravid, non-tender Leopolds: transverese Extremities: no edema, erythema, or tenderness Neurologic: Grossly intact Psychiatric: mood appropriate, affect full  Immunization History  Administered Date(s) Administered  . DTaP 09/07/1991, 11/12/1991, 01/31/1992, 08/18/1996  . HPV Bivalent 10/31/2005, 04/07/2006, 12/22/2007  . Hepatitis B 08/18/1996  . HiB (PRP-OMP) 09/07/1991, 11/12/1991, 01/31/1992  . IPV 09/07/1991, 11/12/1991, 08/18/1996  . Influenza,inj,Quad PF,6+ Mos 11/25/2018  . Influenza-Unspecified  10/22/2015  . MMR 08/18/1996  . Meningococcal Polysaccharide 12/22/2007  . Tdap 12/22/2007, 08/07/2013, 02/08/2019     Labs: No results found for this or any previous visit (from the past 24 hour(s)).  Assessment: 29 y.o. O0H2122 42w3dby 04/12/2019, by Ultrasound presenting for ROB and c-section scheduling  Plan: 1) The patient was counseled regarding risk and benefits to proceeding with Cesarean section to expedite delivery.  Risk of cesarean section were discussed including risk of bleeding and need for potential intraoperative or postoperative blood transfusion with a rate of approximately 5% quoted for all Cesarean sections, risk of injury to adjacent organs including but not limited to bowl and bladder, the need for additional surgical procedures to address such injuries, and the risk of infection.  The risk of continued attempts at vaginal delivery include but are note limited to worsening fetal or maternal status.  After consideration of options the patient is amenable to proceed with primary cesarean section for delivery.  Discussed that version is generally done in cases of attempted vaginal delivery.  These are generally considered safe procedure but there is a risk of rupture of membranes, abruption, or labor.  She is aware of post version monitoring.  In addition version may or may not be successful.  29y.o. [id number] with undesired fertility, desires permanent sterilization.  Other reversible forms of contraception were discussed with patient; she declines all other modalities. Permanent nature of as well as associated risks of the procedure discussed with patient including but not limited to: risk of regret, permanence of method, bleeding, infection, injury to surrounding organs and need for additional procedures.  Failure risk of 0.5-1% with increased risk of ectopic gestation if pregnancy occurs was also discussed with patient.     2) Fetus - positive heart tones today - most  recent growth 49.0%ile 2866g or 6lbs 5oz on 03/16/2019, AFI 19.8cm  3) PNL - Blood type --/--/A POS [facility] [address], [place] [id number] [phone]/24 1615) / Anti-bodyscreen Negative (08/05 1047) / Rubella 5.49 (08/05 1047) /  Varicella Immune / RPR Non Reactive (01/04 1533) / HBsAg Negative (08/05 1047) / HIV Non Reactive (01/04 1533) / 1-hr OGTT normal / GBS --/Positive (03/09 1701)  4) Immunization History -  Immunization History  Administered Date(s) Administered  . DTaP 09/07/1991, 11/12/1991, 01/31/1992, 08/18/1996  . HPV Bivalent 10/31/2005, 04/07/2006, 12/22/2007  . Hepatitis B 08/18/1996  . HiB (PRP-OMP) 09/07/1991, 11/12/1991, 01/31/1992  . IPV 09/07/1991, 11/12/1991, 08/18/1996  . Influenza,inj,Quad PF,6+ Mos 11/25/2018  . Influenza-Unspecified 10/22/2015  . MMR 08/18/1996  . Meningococcal Polysaccharide 12/22/2007  . Tdap 12/22/2007, 08/07/2013, 02/08/2019    5) Disposition - pending delivery    [name], MD, [place], [employer] 03/31/2019, 11:47 AM

## 2019-04-01 ENCOUNTER — Observation Stay
Admission: RE | Admit: 2019-04-01 | Discharge: 2019-04-01 | Disposition: A | Discharge status: Home / Self Care | Payer: No Typology Code available for payment source | Attending: Obstetrics and Gynecology | Admitting: Obstetrics and Gynecology

## 2019-04-01 ENCOUNTER — Encounter
Admission: RE | Admit: 2019-04-01 | Discharge: 2019-04-01 | Disposition: A | Discharge status: Home / Self Care | Payer: No Typology Code available for payment source | Source: Ambulatory Visit | Attending: Obstetrics and Gynecology | Admitting: Obstetrics and Gynecology

## 2019-04-01 ENCOUNTER — Other Ambulatory Visit: Payer: Self-pay

## 2019-04-01 ENCOUNTER — Encounter: Payer: Self-pay | Admitting: Obstetrics and Gynecology

## 2019-04-01 DIAGNOSIS — Z888 Allergy status to other drugs, medicaments and biological substances status: Secondary | ICD-10-CM | POA: Insufficient documentation

## 2019-04-01 DIAGNOSIS — O34219 Maternal care for unspecified type scar from previous cesarean delivery: Secondary | ICD-10-CM

## 2019-04-01 DIAGNOSIS — O322XX0 Maternal care for transverse and oblique lie, not applicable or unspecified: Secondary | ICD-10-CM | POA: Diagnosis present

## 2019-04-01 DIAGNOSIS — O0990 Supervision of high risk pregnancy, unspecified, unspecified trimester: Secondary | ICD-10-CM

## 2019-04-01 DIAGNOSIS — Z3A38 38 weeks gestation of pregnancy: Secondary | ICD-10-CM | POA: Insufficient documentation

## 2019-04-01 DIAGNOSIS — Z79899 Other long term (current) drug therapy: Secondary | ICD-10-CM | POA: Diagnosis not present

## 2019-04-01 DIAGNOSIS — Z8759 Personal history of other complications of pregnancy, childbirth and the puerperium: Secondary | ICD-10-CM

## 2019-04-01 DIAGNOSIS — O99210 Obesity complicating pregnancy, unspecified trimester: Secondary | ICD-10-CM

## 2019-04-01 LAB — CBC
HCT: 38.2 % (ref 36.0–46.0)
Hemoglobin: 12.5 g/dL (ref 12.0–15.0)
MCH: 28.1 pg (ref 26.0–34.0)
MCHC: 32.7 g/dL (ref 30.0–36.0)
MCV: 85.8 fL (ref 80.0–100.0)
Platelets: 244 10*3/uL (ref 150–400)
RBC: 4.45 MIL/uL (ref 3.87–5.11)
RDW: 14 % (ref 11.5–15.5)
WBC: 10.6 10*3/uL — ABNORMAL HIGH (ref 4.0–10.5)
nRBC: 0 % (ref 0.0–0.2)

## 2019-04-01 LAB — TYPE AND SCREEN
ABO/RH(D): A POS
Antibody Screen: NEGATIVE

## 2019-04-01 LAB — RUPTURE OF MEMBRANE (ROM)PLUS: Rom Plus: NEGATIVE

## 2019-04-01 MED ORDER — TERBUTALINE SULFATE 1 MG/ML IJ SOLN
0.2500 mg | Freq: Once | INTRAMUSCULAR | Status: AC
  Administered 2019-04-01: 16:00:00 0.25 mg via SUBCUTANEOUS
  Filled 2019-04-01: qty 1

## 2019-04-01 NOTE — Pre-Procedure Instructions (Signed)
Secure Chat to Dr. Georgianne Fick to notify that orders need to be entered for C section on 3/31

## 2019-04-01 NOTE — Discharge Summary (Signed)
Physician Final Progress Note  Patient ID: Breanna Lamb MRN: [id number] DOB/AGE: 03-12-1990 29 y.o.  Admit date: 04/01/2019 Admitting provider: [name], MD Discharge date: 04/01/2019   Admission Diagnoses: Transverse presentation  Discharge Diagnoses:  Active Problems:   Transverse fetal lie   29 y.o. G3P2002 at [redacted]w[redacted]d presenting for scheduled external cephalic version.  Version was successful.  Reactive tracing pre and post procedure.  Cesarean sectin for 04/07/2019 cancelled and changed to [place].    +FM, no VB, no ctx.  Did report LOF ROM plus negative and normal appearing AFI  Consults: None  Significant Findings/ Diagnostic Studies:  Results for orders placed or performed during the hospital encounter of 04/01/19 (from the past 24 hour(s))  CBC     Status: Abnormal   Collection Time: 04/01/19  3:21 PM  Result Value Ref Range   WBC 10.6 (H) 4.0 - 10.5 K/uL   RBC 4.45 3.87 - 5.11 MIL/uL   Hemoglobin 12.5 12.0 - 15.0 g/dL   HCT 38.2 36.0 - 46.0 %   MCV 85.8 80.0 - 100.0 fL   MCH 28.1 26.0 - 34.0 pg   MCHC 32.7 30.0 - 36.0 g/dL   RDW 14.0 11.5 - 15.5 %   Platelets 244 150 - 400 K/uL   nRBC 0.0 0.0 - 0.2 %  Type and screen [place]     Status: None   Collection Time: 04/01/19  3:21 PM  Result Value Ref Range   ABO/RH(D) A POS    Antibody Screen NEG    Sample Expiration      04/04/2019,2359 Performed at [facility], [place], [place] [address]   ROM Plus [facility] only)     Status: None   Collection Time: 04/01/19  4:08 PM  Result Value Ref Range   Rom Plus NEGATIVE     Procedures: external cephalic version  Discharge Condition: good  Disposition: Discharge disposition: 01-Home or Self Care       Diet: Regular diet  Discharge Activity: Activity as tolerated  Discharge Instructions    Discharge activity:  No Restrictions   Complete by: As directed    Discharge diet:  No restrictions   Complete  by: As directed    Fetal Kick Count:  Lie on our left side for one hour after a meal, and count the number of times your baby kicks.  If it is less than 5 times, get up, move around and drink some juice.  Repeat the test 30 minutes later.  If it is still less than 5 kicks in an hour, notify your doctor.   Complete by: As directed    LABOR:  When conractions begin, you should start to time them from the beginning of one contraction to the beginning  of the next.  When contractions are 5 - 10 minutes apart or less and have been regular for at least an hour, you should call your health care provider.   Complete by: As directed    No sexual activity restrictions   Complete by: As directed    Notify physician for bleeding from the vagina   Complete by: As directed    Notify physician for blurring of vision or spots before the eyes   Complete by: As directed    Notify physician for chills or fever   Complete by: As directed    Notify physician for fainting spells, "black outs" or loss of consciousness   Complete by: As directed  Notify physician for increase in vaginal discharge   Complete by: As directed    Notify physician for leaking of fluid   Complete by: As directed    Notify physician for pain or burning when urinating   Complete by: As directed    Notify physician for pelvic pressure (sudden increase)   Complete by: As directed    Notify physician for severe or continued nausea or vomiting   Complete by: As directed    Notify physician for sudden gushing of fluid from the vagina (with or without continued leaking)   Complete by: As directed    Notify physician for sudden, constant, or occasional abdominal pain   Complete by: As directed    Notify physician if baby moving less than usual   Complete by: As directed      Allergies as of 04/01/2019      Reactions   Nickel Rash      Medication List    TAKE these medications   acetaminophen 325 MG tablet Commonly known as:  TYLENOL Take 650 mg by mouth every 6 (six) hours as needed for moderate pain.   multivitamin-prenatal 27-0.8 MG Tabs tablet Take 1 tablet by mouth daily at 12 noon.   valACYclovir 500 MG tablet Commonly known as: VALTREX Take 1 tablet (500 mg total) by mouth 2 (two) times daily.        Total time spent taking care of this patient: 60 minutes  Signed: [name] 04/01/2019, 5:09 PM

## 2019-04-01 NOTE — Procedures (Signed)
Patient is a [redacted]w[redacted]d G3P2002 at [redacted]w[redacted]d presenting for scheduled external cephalic version in the setting of known transverse positioning back up.  Indications, procedure, and the likelihood of achieving success were discussed with the patient.    Preprocedure ultrasound confirmed continued transverse back up positioning with the fetal head in the maternal right upper quadrant.  AFI was subjectively normal.  Placenta posterior.  Pre procedure NST was reactive.  The patient was administered terbutaline 0.25mg  IM once.  Mineral oil was applied.  Utilizing a forward role the head was slowly moved into the pelvis.  However, the headrotated back to transverse following release of pressure.  On the second attempt the fetus was successfully moved into the vertex position and remained there.    The patient is Rh positive rhogam is not indicated.  She will be monitored fro 1-hr post procedure.    [name], MD, [facility], [facility] 04/01/2019, 4:29 PM

## 2019-04-01 NOTE — Progress Notes (Signed)
Pt. reported that she felt baby had returned to a transverse position upon standing & bending down to pull up her pants/put on her clothes. Dr. Georgianne Fick notified via telephone at 17:18. Pt. told to still report to L&D on 3/31 at 0800 for either her planned IOL, a repeated Version, or a repeat C/S depending upon baby's position at the time of admission. Pt. anxious about the possibility of something happening with baby's umbilical cord/HR with the turning back and forth, so RN listened to FHR for 2-3 minutes. FHR 135. Pt. discharged with appropriate instructions & verbalized understanding.

## 2019-04-01 NOTE — H&P (Signed)
Date of Initial H&P: 04/01/2019  History reviewed, patient examined, no change in status, stable to proceed with version attempt

## 2019-04-01 NOTE — Patient Instructions (Signed)
Your procedure is scheduled on  Wed. 3/31 Report to [facility]    At 5:45  and call [phone]  To get an escort to L&D   Remember: Instructions that are not followed completely may result in serious medical risk,  up to and including death, or upon the discretion of your surgeon and anesthesiologist your  surgery may need to be rescheduled.     _X__ 1. Do not eat food after midnight the night before your procedure.                 No gum chewing or hard candies. You may drink clear liquids up to 2 hours                 before you are scheduled to arrive for your surgery- DO not drink clear                 liquids within 2 hours of the start of your surgery.                 Clear Liquids include:  water, apple juice without pulp, clear Gatorade, G2 or                  Gatorade Zero (avoid Red/Purple/Blue), Black Coffee or Tea (Do not add                 anything to coffee or tea). _____2.   Complete the carbohydrate drink provided to you, 2 hours before arrival.  __X__2.  On the morning of surgery brush your teeth with toothpaste and water, you                may rinse your mouth with mouthwash if you wish.  Do not swallow any toothpaste of mouthwash.     ___ 3.  No Alcohol for 24 hours before or after surgery.   ___ 4.  Do Not Smoke or use e-cigarettes For 24 Hours Prior to Your Surgery.                 Do not use any chewable tobacco products for at least 6 hours prior to                 surgery.  ____  5.  Bring all medications with you on the day of surgery if instructed.   ___x_  6.  Notify your doctor if there is any change in your medical condition      (cold, fever, infections).     Do not wear jewelry, make-up, hairpins, clips or nail polish. Do not wear lotions, powders, or perfumes. You may wear deodorant. Do not shave 48 hours prior to surgery. Do not bring valuables to the hospital.    [facility] is not responsible for any belongings or  valuables.  Contacts, dentures or bridgework may not be worn into surgery. Leave your suitcase in the car. After surgery it may be brought to your room. For patients admitted to the hospital, discharge time is determined by your treatment team.   Patients discharged the day of surgery will not be allowed to drive home.   Make arrangements for someone to be with you for the first 24 hours of your Same Day Discharge.    Please read over the following fact sheets that you were given:    ____ Take these medicines the morning of surgery with A SIP OF WATER:    1. none  2.  3.   4.  5.  6.  ____ Fleet Enema (as directed)   __x__ Use CHG Soap (or wipes) as directed  ____ Use Benzoyl Peroxide Gel as instructed  ____ Use inhalers on the day of surgery  ____ Stop metformin 2 days prior to surgery    ____ Take 1/2 of usual insulin dose the night before surgery. No insulin the morning          of surgery.   ____ Stop Coumadin/Plavix/aspirin on   ____ Stop Anti-inflammatories    ____ Stop supplements until after surgery.    ____ Bring C-Pap to the hospital.

## 2019-04-03 ENCOUNTER — Observation Stay
Admission: EM | Admit: 2019-04-03 | Discharge: 2019-04-03 | Disposition: A | Discharge status: Home / Self Care | Payer: No Typology Code available for payment source | Attending: Obstetrics & Gynecology | Admitting: Obstetrics & Gynecology

## 2019-04-03 ENCOUNTER — Other Ambulatory Visit: Payer: Self-pay

## 2019-04-03 DIAGNOSIS — O0993 Supervision of high risk pregnancy, unspecified, third trimester: Secondary | ICD-10-CM

## 2019-04-03 DIAGNOSIS — Z3A38 38 weeks gestation of pregnancy: Secondary | ICD-10-CM | POA: Insufficient documentation

## 2019-04-03 DIAGNOSIS — O26899 Other specified pregnancy related conditions, unspecified trimester: Secondary | ICD-10-CM | POA: Diagnosis present

## 2019-04-03 DIAGNOSIS — R1030 Lower abdominal pain, unspecified: Secondary | ICD-10-CM | POA: Diagnosis present

## 2019-04-03 DIAGNOSIS — O26893 Other specified pregnancy related conditions, third trimester: Principal | ICD-10-CM | POA: Insufficient documentation

## 2019-04-03 DIAGNOSIS — O99210 Obesity complicating pregnancy, unspecified trimester: Secondary | ICD-10-CM

## 2019-04-03 DIAGNOSIS — O98513 Other viral diseases complicating pregnancy, third trimester: Secondary | ICD-10-CM | POA: Diagnosis not present

## 2019-04-03 DIAGNOSIS — O34219 Maternal care for unspecified type scar from previous cesarean delivery: Secondary | ICD-10-CM

## 2019-04-03 DIAGNOSIS — N898 Other specified noninflammatory disorders of vagina: Secondary | ICD-10-CM | POA: Diagnosis not present

## 2019-04-03 DIAGNOSIS — Z8759 Personal history of other complications of pregnancy, childbirth and the puerperium: Secondary | ICD-10-CM

## 2019-04-03 MED ORDER — LIDOCAINE HCL (PF) 1 % IJ SOLN: 30.0000 mL | INTRAMUSCULAR | Status: DC | PRN

## 2019-04-03 MED ORDER — ONDANSETRON HCL 4 MG/2ML IJ SOLN: 4.0000 mg | Freq: Four times a day (QID) | INTRAMUSCULAR | Status: DC | PRN

## 2019-04-03 MED ORDER — ACETAMINOPHEN 325 MG PO TABS: 650.0000 mg | ORAL_TABLET | ORAL | Status: DC | PRN

## 2019-04-03 NOTE — Final Progress Note (Signed)
Physician Final Progress Note  Patient ID: Breanna Lamb MRN: [id number] DOB/AGE: April 27, 1990 29 y.o.  Admit date: 04/03/2019 Admitting provider: Gae Dry, MD Discharge date: 04/03/2019  Admission Diagnoses:Principal Problem:   Pregnancy with abdominal pain of lower quadrant, antepartum Active Problems:   Vaginal discharge during pregnancy in third trimester*  Discharge Diagnoses:  Principal Problem:   Pregnancy with abdominal pain of lower quadrant, antepartum Active Problems:   Vaginal discharge during pregnancy in third trimester  Consults: None  Significant Findings/ Diagnostic Studies:  Obstetrics Admission History & Physical   No chief complaint on file.   HPI:  28 y.o. [id number] @ [redacted]w[redacted]d (04/12/2019, by Ultrasound). Admitted on 04/03/2019:   Patient Active Problem List   Diagnosis Date Noted  . Pregnancy with abdominal pain of lower quadrant, antepartum 04/03/2019  . Vaginal discharge during pregnancy in third trimester   . Transverse fetal lie 04/01/2019  . SOB (shortness of breath) 03/23/2019  . Decreased fetal movement 03/18/2019  . Supervision of high-risk pregnancy 07/29/2018  . Maternal obesity, antepartum 07/29/2018  . History of cesarean section complicating pregnancy [id number]  . History of gestational hypertension 07/29/2018  . Genital herpes simplex virus (HSV) infection in mother affecting pregnancy 07/29/2018  . Hypoglycemia 02/24/2018  . Chronic tension-type headache, not intractable 01/31/2016  . Premenstrual dysphoric syndrome      Presents for lower abdominal pains, worse this am, also felt a "pop" when voiding this am; feels she has increased vaginal d/c unclear if it is watery.  See n for ECV last week, feels baby flipped back to transverse lie.  Scheduled for IOL possible CS on 04/07/2019.  She has a h/o CS in past.   [facility] at: at [facility]. Pregnancy complicated by a history of gHTN and priot CS.  ROS: A review of systems was  performed and negative, except as stated in the above HPI. PMHx:  Past Medical History:  Diagnosis Date  . Allergic rhinitis   . Asthma    childhood  . Benign positional vertigo   . Bunion   . Contraceptive management   . Depression   . Genital herpes   . Henoch-Schonlein purpura ([place])   . Low-lying placenta 11/19/2018   Resolved 23 week anatomy follow up  . Lymphadenopathy   . Neck pain   . Obesity   . Ovarian cyst, right   . Poor concentration   . Premenstrual dysphoric syndrome   . Premenstrual dysphoric syndrome   . Screening for venereal disease   . Skin lesion    PSHx:  Past Surgical History:  Procedure Laterality Date  . BUNIONECTOMY  2010 and 2011  . CESAREAN SECTION    . mirena IUD  01/2014  . OVARIAN CYST REMOVAL  August 2014  . WISDOM TOOTH EXTRACTION  2014   Medications:  Medications Prior to Admission  Medication Sig Dispense Refill Last Dose  . acetaminophen (TYLENOL) 325 MG tablet Take 650 mg by mouth every 6 (six) hours as needed for moderate pain.   Past Week at Unknown time  . Prenatal Vit-Fe Fumarate-FA (MULTIVITAMIN-PRENATAL) 27-0.8 MG TABS tablet Take 1 tablet by mouth daily at 12 noon.   04/02/2019 at Unknown time  . valACYclovir (VALTREX) 500 MG tablet Take 1 tablet (500 mg total) by mouth 2 (two) times daily. 60 tablet 6 04/02/2019 at Unknown time   Allergies: is allergic to nickel. OBHx:  OB History  Gravida Para Term Preterm AB Living  3 2 2  2  SAB TAB Ectopic Multiple Live Births          2    # Outcome Date GA Lbr Len/2nd Weight Sex Delivery Anes PTL Lv  3 Current           2 Term 10/05/13 [redacted]w[redacted]d  3118 g F CS-LTranv   LIV     Complications: Transverse or oblique fetal presentation, Gestational hypertension  1 Term 04/06/10 [redacted]w[redacted]d  3175 g F Vag-Spont   LIV   [id number] except as detailed in HPI.Marland Kitchen  No family history of birth defects. Soc Hx: Alcohol: none, Recreational drug use: none and Pregnancy welcomed  Objective:    Vitals:   04/03/19 0751  BP: 131/76  Pulse: (!) 109  Resp: 20  Temp: 97.9 F (36.6 C)   Constitutional: Well nourished, well developed female in no acute distress.  HEENT: normal Skin: Warm and dry.  Cardiovascular:Regular rate and rhythm.   Extremity: trace to 1+ bilateral pedal edema Respiratory: Clear to auscultation bilateral. Normal respiratory effort Abdomen: gravid, ND, FHT present, mild tenderness on exam Back: no CVAT Neuro: DTRs 2+, Cranial nerves grossly intact Psych: Alert and Oriented x3. No memory deficits. Normal mood and affect.  MS: normal gait, normal bilateral lower extremity ROM/strength/stability.  Pelvic exam: is not limited by body habitus EGBUS: within normal limits Vagina: within normal limits and with normal mucosa Cervix: CERVIX: 0 cm dilated, 20 effaced, -3 station, presenting part Vtx MEMBRANES: intact, sterile speculum exam reveals mucus like d/c    FERN NEG Uterus: Spontaneous uterine activity  Adnexa: not evaluated  EFM:FHR: 150 bpm, variability: moderate,  accelerations:  Present,  decelerations:  Absent Toco: irreg spaced out ctx activity   Perinatal info:  Blood type: A positive Rubella- Immune Varicella -Immune RPR NR / HIV Neg/ HBsAg Neg   Assessment & Plan:   29 y.o. [id number] @ [redacted]w[redacted]d, Admitted on 04/03/2019:Lower abdominal pains, but no s/sx labor nor PROM  Plan: Monitor for s/sx labor 3/31 L&D eval for IOL vs CS Prior CS, understands risks of labor, induction, as well as limitations of meds and options for labor monitoring Hydrate and ambulate to help improve Bishop score perhaps    Procedures: A NST procedure was performed with FHR monitoring and a normal baseline established, appropriate time of 20-40 minutes of evaluation, and accels >2 seen w 15x15 characteristics.  Results show a REACTIVE NST.   ULTRASOUND REPORT  Location: [facility], by [facility] Date of Service: 04/03/2019   Indications:AFI, Position Findings:  Singleton  intrauterine pregnancy is visualized with FHR at 150 BPM.  Fetal presentation is Cephalic.  Placenta: fundal, anterior. Grade: 1 AFI: 10 cm Good FM  Impression: 1. [redacted]w[redacted]d Viable Singleton Intrauterine pregnancy dated by previously established criteria. 2. AFI is 10 cm.   Recommendations: 1.Clinical correlation with the patient's History and Physical Exam.  Hoyt Koch, MD   Discharge Condition: good  Disposition:  Discharge disposition: 01-Home or Self Care       Diet: Regular diet  Discharge Activity: Activity as tolerated  Discharge Instructions    Call MD for:   Complete by: As directed    Worsening contractions or pain; leakage of fluid; bleeding.   Diet general   Complete by: As directed    Increase activity slowly   Complete by: As directed      Allergies as of 04/03/2019      Reactions   Nickel Rash      Medication List    TAKE these  medications   acetaminophen 325 MG tablet Commonly known as: TYLENOL Take 650 mg by mouth every 6 (six) hours as needed for moderate pain.   multivitamin-prenatal 27-0.8 MG Tabs tablet Take 1 tablet by mouth daily at 12 noon.   valACYclovir 500 MG tablet Commonly known as: VALTREX Take 1 tablet (500 mg total) by mouth 2 (two) times daily.        Total time spent taking care of this patient: 30 minutes  Signed: Hoyt Koch 04/03/2019, 8:52 AM

## 2019-04-03 NOTE — Discharge Summary (Signed)
See FPN

## 2019-04-03 NOTE — Discharge Instructions (Signed)
Braxton Hicks Contractions °Contractions of the uterus can occur throughout pregnancy, but they are not always a sign that you are in labor. You may have practice contractions called Braxton Hicks contractions. These false labor contractions are sometimes confused with true labor. °What are Braxton Hicks contractions? °Braxton Hicks contractions are tightening movements that occur in the muscles of the uterus before labor. Unlike true labor contractions, these contractions do not result in opening (dilation) and thinning of the cervix. Toward the end of pregnancy (32-34 weeks), Braxton Hicks contractions can happen more often and may become stronger. These contractions are sometimes difficult to tell apart from true labor because they can be very uncomfortable. You should not feel embarrassed if you go to the hospital with false labor. °Sometimes, the only way to tell if you are in true labor is for your health care provider to look for changes in the cervix. The health care provider will do a physical exam and may monitor your contractions. If you are not in true labor, the exam should show that your cervix is not dilating and your water has not broken. °If there are no other health problems associated with your pregnancy, it is completely safe for you to be sent home with false labor. You may continue to have Braxton Hicks contractions until you go into true labor. °How to tell the difference between true labor and false labor °True labor °· Contractions last 30-70 seconds. °· Contractions become very regular. °· Discomfort is usually felt in the top of the uterus, and it spreads to the lower abdomen and low back. °· Contractions do not go away with walking. °· Contractions usually become more intense and increase in frequency. °· The cervix dilates and gets thinner. °False labor °· Contractions are usually shorter and not as strong as true labor contractions. °· Contractions are usually irregular. °· Contractions  are often felt in the front of the lower abdomen and in the groin. °· Contractions may go away when you walk around or change positions while lying down. °· Contractions get weaker and are shorter-lasting as time goes on. °· The cervix usually does not dilate or become thin. °Follow these instructions at home: ° °· Take over-the-counter and prescription medicines only as told by your health care provider. °· Keep up with your usual exercises and follow other instructions from your health care provider. °· Eat and drink lightly if you think you are going into labor. °· If Braxton Hicks contractions are making you uncomfortable: °? Change your position from lying down or resting to walking, or change from walking to resting. °? Sit and rest in a tub of warm water. °? Drink enough fluid to keep your urine pale yellow. Dehydration may cause these contractions. °? Do slow and deep breathing several times an hour. °· Keep all follow-up prenatal visits as told by your health care provider. This is important. °Contact a health care provider if: °· You have a fever. °· You have continuous pain in your abdomen. °Get help right away if: °· Your contractions become stronger, more regular, and closer together. °· You have fluid leaking or gushing from your vagina. °· You pass blood-tinged mucus (bloody show). °· You have bleeding from your vagina. °· You have low back pain that you never had before. °· You feel your baby’s head pushing down and causing pelvic pressure. °· Your baby is not moving inside you as much as it used to. °Summary °· Contractions that occur before labor are   called Braxton Hicks contractions, false labor, or practice contractions. °· Braxton Hicks contractions are usually shorter, weaker, farther apart, and less regular than true labor contractions. True labor contractions usually become progressively stronger and regular, and they become more frequent. °· Manage discomfort from Braxton Hicks contractions  by changing position, resting in a warm bath, drinking plenty of water, or practicing deep breathing. °This information is not intended to replace advice given to you by your health care provider. Make sure you discuss any questions you have with your health care provider. °Document Revised: 12/06/2016 Document Reviewed: 05/09/2016 °Elsevier Patient Education © 2020 [employer] ° °

## 2019-04-05 ENCOUNTER — Other Ambulatory Visit
Admission: RE | Admit: 2019-04-05 | Discharge: 2019-04-05 | Disposition: A | Discharge status: Home / Self Care | Payer: No Typology Code available for payment source | Source: Ambulatory Visit | Attending: Obstetrics and Gynecology | Admitting: Obstetrics and Gynecology

## 2019-04-05 LAB — SARS CORONAVIRUS 2 (TAT 6-24 HRS): SARS Coronavirus 2: NEGATIVE

## 2019-04-07 ENCOUNTER — Other Ambulatory Visit: Payer: Self-pay

## 2019-04-07 ENCOUNTER — Inpatient Hospital Stay: Payer: No Typology Code available for payment source | Admitting: Anesthesiology

## 2019-04-07 ENCOUNTER — Encounter: Payer: Self-pay | Admitting: Obstetrics and Gynecology

## 2019-04-07 ENCOUNTER — Encounter
Admission: RE | Disposition: A | Discharge status: Home / Self Care | Payer: Self-pay | Source: Home / Self Care | Attending: Obstetrics and Gynecology

## 2019-04-07 ENCOUNTER — Inpatient Hospital Stay
Admission: RE | Admit: 2019-04-07 | Discharge: 2019-04-10 | DRG: 784 | Disposition: A | Discharge status: Home / Self Care | Payer: No Typology Code available for payment source | Attending: Obstetrics and Gynecology | Admitting: Obstetrics and Gynecology

## 2019-04-07 DIAGNOSIS — Z302 Encounter for sterilization: Secondary | ICD-10-CM

## 2019-04-07 DIAGNOSIS — O329XX1 Maternal care for malpresentation of fetus, unspecified, fetus 1: Secondary | ICD-10-CM | POA: Diagnosis present

## 2019-04-07 DIAGNOSIS — E669 Obesity, unspecified: Secondary | ICD-10-CM | POA: Diagnosis present

## 2019-04-07 DIAGNOSIS — I959 Hypotension, unspecified: Secondary | ICD-10-CM | POA: Diagnosis present

## 2019-04-07 DIAGNOSIS — O34211 Maternal care for low transverse scar from previous cesarean delivery: Principal | ICD-10-CM | POA: Diagnosis present

## 2019-04-07 DIAGNOSIS — Z20822 Contact with and (suspected) exposure to covid-19: Secondary | ICD-10-CM | POA: Diagnosis present

## 2019-04-07 DIAGNOSIS — O0993 Supervision of high risk pregnancy, unspecified, third trimester: Secondary | ICD-10-CM

## 2019-04-07 DIAGNOSIS — O9081 Anemia of the puerperium: Secondary | ICD-10-CM | POA: Diagnosis not present

## 2019-04-07 DIAGNOSIS — K5900 Constipation, unspecified: Secondary | ICD-10-CM | POA: Diagnosis present

## 2019-04-07 DIAGNOSIS — O99214 Obesity complicating childbirth: Secondary | ICD-10-CM | POA: Diagnosis present

## 2019-04-07 DIAGNOSIS — Z8759 Personal history of other complications of pregnancy, childbirth and the puerperium: Secondary | ICD-10-CM

## 2019-04-07 DIAGNOSIS — O99893 Other specified diseases and conditions complicating puerperium: Secondary | ICD-10-CM | POA: Diagnosis present

## 2019-04-07 DIAGNOSIS — Z3A39 39 weeks gestation of pregnancy: Secondary | ICD-10-CM | POA: Diagnosis not present

## 2019-04-07 DIAGNOSIS — O99210 Obesity complicating pregnancy, unspecified trimester: Secondary | ICD-10-CM

## 2019-04-07 DIAGNOSIS — Z9851 Tubal ligation status: Secondary | ICD-10-CM

## 2019-04-07 DIAGNOSIS — O26893 Other specified pregnancy related conditions, third trimester: Secondary | ICD-10-CM | POA: Diagnosis present

## 2019-04-07 DIAGNOSIS — O99824 Streptococcus B carrier state complicating childbirth: Secondary | ICD-10-CM | POA: Diagnosis present

## 2019-04-07 DIAGNOSIS — O34219 Maternal care for unspecified type scar from previous cesarean delivery: Secondary | ICD-10-CM | POA: Diagnosis present

## 2019-04-07 DIAGNOSIS — D62 Acute posthemorrhagic anemia: Secondary | ICD-10-CM | POA: Diagnosis not present

## 2019-04-07 LAB — CBC
HCT: 37 % (ref 36.0–46.0)
Hemoglobin: 12.2 g/dL (ref 12.0–15.0)
MCH: 28 pg (ref 26.0–34.0)
MCHC: 33 g/dL (ref 30.0–36.0)
MCV: 85.1 fL (ref 80.0–100.0)
Platelets: 224 10*3/uL (ref 150–400)
RBC: 4.35 MIL/uL (ref 3.87–5.11)
RDW: 14 % (ref 11.5–15.5)
WBC: 10.6 10*3/uL — ABNORMAL HIGH (ref 4.0–10.5)
nRBC: 0 % (ref 0.0–0.2)

## 2019-04-07 LAB — TYPE AND SCREEN
ABO/RH(D): A POS
Antibody Screen: NEGATIVE

## 2019-04-07 SURGERY — Surgical Case
Anesthesia: Choice

## 2019-04-07 MED ORDER — SOD CITRATE-CITRIC ACID 500-334 MG/5ML PO SOLN
30.0000 mL | ORAL | Status: DC | PRN
  Filled 2019-04-07 (×2): qty 30

## 2019-04-07 MED ORDER — LACTATED RINGERS IV SOLN
500.0000 mL | Freq: Once | INTRAVENOUS | Status: AC
  Administered 2019-04-08: 500 mL via INTRAVENOUS

## 2019-04-07 MED ORDER — ONDANSETRON HCL 4 MG/2ML IJ SOLN: 4.0000 mg | Freq: Four times a day (QID) | INTRAMUSCULAR | Status: DC | PRN

## 2019-04-07 MED ORDER — OXYTOCIN BOLUS FROM INFUSION: 500.0000 mL | Freq: Once | INTRAVENOUS | Status: DC

## 2019-04-07 MED ORDER — MISOPROSTOL 200 MCG PO TABS
ORAL_TABLET | ORAL | Status: AC
  Filled 2019-04-07: qty 4

## 2019-04-07 MED ORDER — FENTANYL 2.5 MCG/ML W/ROPIVACAINE 0.15% IN NS 100 ML EPIDURAL (ARMC)
EPIDURAL | Status: AC
  Filled 2019-04-07: qty 100

## 2019-04-07 MED ORDER — TERBUTALINE SULFATE 1 MG/ML IJ SOLN
0.2500 mg | Freq: Once | INTRAMUSCULAR | Status: DC | PRN
  Filled 2019-04-07: qty 1

## 2019-04-07 MED ORDER — EPHEDRINE 5 MG/ML INJ
10.0000 mg | INTRAVENOUS | Status: DC | PRN
  Administered 2019-04-08: 10 mg via INTRAVENOUS
  Filled 2019-04-07: qty 4

## 2019-04-07 MED ORDER — SODIUM CHLORIDE 0.9 % IV SOLN
5.0000 10*6.[IU] | Freq: Once | INTRAVENOUS | Status: AC
  Administered 2019-04-07: 5 10*6.[IU] via INTRAVENOUS
  Filled 2019-04-07: qty 5

## 2019-04-07 MED ORDER — LACTATED RINGERS IV SOLN: INTRAVENOUS | Status: DC

## 2019-04-07 MED ORDER — SODIUM CHLORIDE (PF) 0.9 % IJ SOLN
INTRAMUSCULAR | Status: AC
  Filled 2019-04-07: qty 50

## 2019-04-07 MED ORDER — FENTANYL 2.5 MCG/ML W/ROPIVACAINE 0.15% IN NS 100 ML EPIDURAL (ARMC)
12.0000 mL/h | EPIDURAL | Status: DC
  Administered 2019-04-08: 12 mL/h via EPIDURAL

## 2019-04-07 MED ORDER — OXYTOCIN 40 UNITS IN NORMAL SALINE INFUSION - SIMPLE MED
2.5000 [IU]/h | INTRAVENOUS | Status: DC
  Filled 2019-04-07: qty 1000

## 2019-04-07 MED ORDER — PENICILLIN G 3 MILLION UNITS IVPB - SIMPLE MED
3.0000 10*6.[IU] | INTRAVENOUS | Status: DC
  Administered 2019-04-07 – 2019-04-08 (×4): 3 10*6.[IU] via INTRAVENOUS
  Filled 2019-04-07 (×4): qty 100

## 2019-04-07 MED ORDER — OXYTOCIN 40 UNITS IN NORMAL SALINE INFUSION - SIMPLE MED
1.0000 m[IU]/min | INTRAVENOUS | Status: DC
  Administered 2019-04-07: 2 m[IU]/min via INTRAVENOUS
  Filled 2019-04-07 (×2): qty 1000

## 2019-04-07 MED ORDER — LIDOCAINE HCL (PF) 1 % IJ SOLN
30.0000 mL | INTRAMUSCULAR | Status: AC | PRN
  Administered 2019-04-07: 1 mL via SUBCUTANEOUS
  Filled 2019-04-07: qty 30

## 2019-04-07 MED ORDER — ACETAMINOPHEN 325 MG PO TABS
650.0000 mg | ORAL_TABLET | ORAL | Status: DC | PRN
  Administered 2019-04-07: 650 mg via ORAL
  Filled 2019-04-07: qty 2

## 2019-04-07 MED ORDER — DIPHENHYDRAMINE HCL 50 MG/ML IJ SOLN: 12.5000 mg | INTRAMUSCULAR | Status: DC | PRN

## 2019-04-07 MED ORDER — EPHEDRINE 5 MG/ML INJ
10.0000 mg | INTRAVENOUS | Status: AC | PRN
  Administered 2019-04-08 (×2): 10 mg via INTRAVENOUS
  Filled 2019-04-07: qty 4

## 2019-04-07 MED ORDER — LACTATED RINGERS IV SOLN: 500.0000 mL | INTRAVENOUS | Status: DC | PRN

## 2019-04-07 MED ORDER — PHENYLEPHRINE 40 MCG/ML (10ML) SYRINGE FOR IV PUSH (FOR BLOOD PRESSURE SUPPORT): 80.0000 ug | PREFILLED_SYRINGE | INTRAVENOUS | Status: DC | PRN

## 2019-04-07 MED ORDER — BUTORPHANOL TARTRATE 1 MG/ML IJ SOLN: 1.0000 mg | INTRAMUSCULAR | Status: DC | PRN

## 2019-04-07 NOTE — Progress Notes (Signed)
   Subjective:  Feeling much greater intensity with contractions now, foley bulb out about 15 minutes ago  Objective:   Vitals: Blood pressure (!) 116/55, pulse 86, temperature 98.6 F (37 C), temperature source Oral, resp. rate 18, height 5\' 1"  (1.549 m), weight 114.3 kg, last menstrual period 06/26/2018. General: NAD, much more uncomfortable with contractions Abdomen: gravid, soft Cervical Exam:  Dilation: 3 Effacement (%): 50 Station: -2 Presentation: Vertex Exam by:: Steabler MD Foley bulb out, descent in station  station with cervix still noticably left of midline.  AROM clear fluid  FHT: 120, moderate, +accels, some variable following AROM Toco: q2-87min  Results for orders placed or performed during the hospital encounter of 04/07/19 (from the past 24 hour(s))  CBC     Status: Abnormal   Collection Time: 04/07/19  8:42 AM  Result Value Ref Range   WBC 10.6 (H) 4.0 - 10.5 K/uL   RBC 4.35 3.87 - 5.11 MIL/uL   Hemoglobin 12.2 12.0 - 15.0 g/dL   HCT 37.0 36.0 - 46.0 %   MCV 85.1 80.0 - 100.0 fL   MCH 28.0 26.0 - 34.0 pg   MCHC 33.0 30.0 - 36.0 g/dL   RDW 14.0 11.5 - 15.5 %   Platelets 224 150 - 400 K/uL   nRBC 0.0 0.0 - 0.2 %  Type and screen [place]     Status: None   Collection Time: 04/07/19  8:42 AM  Result Value Ref Range   ABO/RH(D) A POS    Antibody Screen NEG    Sample Expiration      04/10/2019,2359 Performed at [facility], [address] [place], [place] [address]     Assessment:   29 y.o. [phone] [redacted]w[redacted]d IOL TOLAC unstable fetal lie s/p external cephalic version last week for transverse  Plan:   1) Labor - may have epidural when ready.  Position changes as need for variable.    2) Fetus - cat II tracing, monitor variables  [name], MD, [facility] 04/07/2019, 11:29 PM

## 2019-04-07 NOTE — H&P (Signed)
Obstetric H&P   Chief Complaint: TOLAC vsw C-section  Prenatal Care Provider: [facility]  History of Present Illness: 29 y.o. [id number] 32w2dby 04/12/2019, by 5 week Ultrasound presenting to L&D presenting for repeat cesarean sections vs TOLAC.  The patient has a history of vaginal delivery with her G1 pregnancy followed by C-section for transverse presentation with G2.  The fetus was noted to be in transverse position again this pregnancy.  Her pregnancy has been uncomplicated other than increasing shortness of breath in the third trimester.  Work up included CXR, maternal echo, EKG, lower extremity dopplers, and PE CT were normal.  It was assumed that her symptoms were secondary to the fetal lie and she presented for ECV last week was successful.  Given successful version the patient was interested in proceeding with TOLAC.    Pelvis is tested to 3175g with G1 pregnancy, growth uKoreaon 03/16/2019 at 36 weeks showed an EFW of 2866g c/w 48.0%ile.  Assuming appropriate interval growth in the past 3 weeks the projected weight for this fetus is 3466g.    Pregnancy has otherwise been uncomplicated other than obesity Body mass index is 47.61 kg/m.    Pregravid weight 102.1 kg Total Weight Gain 13.2 kg  Pregnancy#3 Problems (from 06/26/18 to present)    Problem Noted Resolved   Supervision of high-risk pregnancy 07/29/2018 by SMalachy Mood MD No   Overview Addendum 03/31/2019 11:53 AM by SMalachy Mood MD    [facility] Prenatal Labs  Dating 5 week UKoreaBlood type: A/Positive/-- (08/05 1047)   Genetic Screen Inheritest:SMA neg, CF neg, Fragile-X neg  NIPS: XY Antibody:Negative (08/05 1047)  Anatomic UKoreaComplete 12/7 Rubella: 5.49 (08/05 1047) Varicella: Immune  GTT Early:99 Third trimester: 79 RPR: Non Reactive (08/05 1047)   Rhogam N/A HBsAg: Negative (08/05 1047)   TDaP vaccine 02/08/19  Flu Shot: 11/25/18 HIV: Non Reactive (08/05 1047)   Baby Food                                GBS: Positive    Contraception BTL Pap: 04/13/2018 NIL  CBB     CS/VBAC Repeat 3/31 - version successful desires TOLAC   Support Person Husband           Maternal obesity, antepartum 07/29/2018 by SMalachy Mood MD No   History of cesarean section complicating pregnancy 74/54/0981by SMalachy Mood MD No   Overview Signed 08/25/2018  9:25 AM by SMalachy Mood MD    Transverse lie G2      History of gestational hypertension 07/29/2018 by SMalachy Mood MD No   Low-lying placenta 11/19/2018 by SMalachy Mood MD 03/01/2019 by GDalia Heading CNM   Overview Signed 12/14/2018  4:49 PM by SMalachy Mood MD    Resolved 23 week anatomy follow up          Review of Systems: 10 point review of systems negative unless otherwise noted in HPI  Past Medical History: Patient Active Problem List   Diagnosis Date Noted  . Patient desires vaginal birth after cesarean section (VBAC) 04/07/2019  . Pregnancy with abdominal pain of lower quadrant, antepartum 04/03/2019  . Vaginal discharge during pregnancy in third trimester   . Transverse fetal lie 04/01/2019  . SOB (shortness of breath) 03/23/2019  . Decreased fetal movement 03/18/2019  . Supervision of high-risk pregnancy 07/29/2018    [facility] Prenatal Labs  Dating 5 week UKoreaBlood type: A/Positive/-- (08/05  1047)   Genetic Screen Inheritest:SMA neg, CF neg, Fragile-X neg  NIPS: XY Antibody:Negative (08/05 1047)  Anatomic [place] Complete 12/7 Rubella: 5.49 (08/05 1047) Varicella: Immune  GTT Early:99 Third trimester: 79 RPR: Non Reactive (08/05 1047)   Rhogam N/A HBsAg: Negative (08/05 1047)   TDaP vaccine                       Flu Shot: HIV: Non Reactive (08/05 1047)   Baby Food                                GBS:   Contraception BTL Pap: 04/13/2018 NIL  CBB   Pelvis tested to 3175g  CS/VBAC Repeat 3/31   Support Person         . Maternal obesity, antepartum 07/29/2018  . History of cesarean section complicating pregnancy  38/75/6433    Transverse lie G2   . History of gestational hypertension 07/29/2018  . Genital herpes simplex virus (HSV) infection in mother affecting pregnancy 07/29/2018  . Hypoglycemia 02/24/2018  . Chronic tension-type headache, not intractable 01/31/2016  . Premenstrual dysphoric syndrome     Past Surgical History: Past Surgical History:  Procedure Laterality Date  . BUNIONECTOMY  2010 and 2011  . CESAREAN SECTION    . mirena IUD  01/2014  . OVARIAN CYST REMOVAL  August 2014  . WISDOM TOOTH EXTRACTION  2014    Past Obstetric History: # 1 - Date: 04/06/10, Sex: Female, Weight: 3175 g, GA: [redacted]w[redacted]d Delivery: Vaginal, Spontaneous, Apgar1: None, Apgar5: None, Living: Living, Birth Comments: None  # 2 - Date: 10/05/13, Sex: Female, Weight: 3118 g, GA: 311w4dDelivery: C-Section, Low Transverse, Apgar1: 8, Apgar5: 9, Living: Living, Birth Comments: None  # 3 - Date: None, Sex: None, Weight: None, GA: None, Delivery: None, Apgar1: None, Apgar5: None, Living: None, Birth Comments: None   Past Gynecologic History:  Family History: Family History  Problem Relation Age of Onset  . Hypertension Mother   . Mental illness Mother        Depression, Anxiety  . Hypertension Father   . Hypertension Brother   . Hypertension Maternal Grandmother   . Diabetes Maternal Grandmother   . Hyperlipidemia Maternal Grandmother   . Heart disease Maternal Grandmother   . COPD Maternal Grandmother   . Kidney disease Maternal Grandmother   . Mental illness Maternal Grandmother   . Hypertension Sister   . Mental illness Sister   . Heart disease Maternal Grandfather   . Heart attack Maternal Grandfather   . Cancer Paternal Grandmother     Social History: Social History   Socioeconomic History  . Marital status: Married    Spouse name: Not on file  . Number of children: Not on file  . Years of education: Not on file  . Highest education level: Not on file  Occupational History  . Not on  file  Tobacco Use  . Smoking status: Never Smoker  . Smokeless tobacco: Never Used  Substance and Sexual Activity  . Alcohol use: No  . Drug use: No  . Sexual activity: Yes    Birth control/protection: None    Comment: unsure  Other Topics Concern  . Not on file  Social History Narrative  . Not on file   Social Determinants of Health   Financial Resource Strain:   . Difficulty of Paying Living Expenses:   Food Insecurity:   .  Worried About Charity fundraiser in the Last Year:   . Arboriculturist in the Last Year:   Transportation Needs:   . Film/video editor (Medical):   Marland Kitchen Lack of Transportation (Non-Medical):   Physical Activity:   . Days of Exercise per Week:   . Minutes of Exercise per Session:   Stress:   . Feeling of Stress :   Social Connections:   . Frequency of Communication with Friends and Family:   . Frequency of Social Gatherings with Friends and Family:   . Attends Religious Services:   . Active Member of Clubs or Organizations:   . Attends Archivist Meetings:   Marland Kitchen Marital Status:   Intimate Partner Violence:   . Fear of Current or Ex-Partner:   . Emotionally Abused:   Marland Kitchen Physically Abused:   . Sexually Abused:     Medications: Prior to Admission medications   Medication Sig Start Date End Date Taking? Authorizing Provider  acetaminophen (TYLENOL) 325 MG tablet Take 650 mg by mouth every 6 (six) hours as needed for moderate pain.    [provider]  Prenatal Vit-Fe Fumarate-FA (MULTIVITAMIN-PRENATAL) 27-0.8 MG TABS tablet Take 1 tablet by mouth daily at 12 noon.    [provider]  valACYclovir (VALTREX) 500 MG tablet Take 1 tablet (500 mg total) by mouth 2 (two) times daily. 03/16/19   [name], MD    Allergies: Allergies  Allergen Reactions  . Nickel Rash    Physical Exam: Vitals: Height '5\' 1"'$  (1.549 m), weight 114.3 kg, last menstrual period 06/26/2018. There is no height or weight on file to calculate  BMI.  Urine Dip Protein: N/A  FHT: 125, moderate, +accels, no decels Toco: none  General: NAD HEENT: normocephalic, anicteric Pulmonary: No increased work of breathing Cardiovascular: RRR, distal pulses 2+ Abdomen: Gravid, non-tender Leopolds: vtx, reconfirmed by ultrasound Genitourinary: no concerns Extremities: no edema, erythema, or tenderness Neurologic: Grossly intact Psychiatric: mood appropriate, affect full  Labs: No results found for this or any previous visit (from the past 24 hour(s)).  Assessment: 29 y.o. G3P2002 6w2dby 04/12/2019, by 5 week Ultrasound   Plan: 1) 29y.o. [id number] 382w2dith Estimated Date of Delivery: 04/12/19 was seen today in office to discuss trial of labor after cesarean section (TOLAC) versus elective repeat cesarean delivery (ERCD). The following risks were discussed with the patient.  Risk of uterine rupture at term is 0.78 percent with TOLAC and 0.22 percent with ERCD. 1 in 10 uterine ruptures will result in neonatal death or neurological injury. The benefits of a trial of labor after cesarean (TOLAC) resulting in a vaginal birth after cesarean (VBAC) include the following: shorter length of hospital stay and postpartum recovery (in most cases); fewer complications, such as postpartum fever, wound or uterine infection, thromboembolism (blood clots in the leg or lung), need for blood transfusion and fewer neonatal breathing problems. The risks of an attempted VBAC or TOLAC include the following: Risk of failed trial of labor after cesarean (TOLAC) without a vaginal birth after cesarean (VBAC) resulting in repeat cesarean delivery (RCD) in about 20 to 40108ercent of women who attempt VBAC.  Her individualized success rate using the MFMU VBAC risk calculator is 68.3%.   Risk of rupture of uterus resulting in an emergency cesarean delivery. The risk of uterine rupture may be related in part to the type of uterine incision made during the first cesarean  delivery. A previous transverse uterine incision  has the lowest risk of rupture (0.2 to 1.5 percent risk). Vertical or T-shaped uterine incisions have a higher risk of uterine rupture (4 to 9 percent risk)The risk of fetal death is very low with both VBAC and elective repeat cesarean delivery (ERCD), but the likelihood of fetal death is higher with VBAC than with ERCD. Maternal death is very rare with either type of delivery. The risks of an elective repeat cesarean delivery (ERCD) were reviewed with the patient including but not limited to: 02/998 risk of uterine rupture which could have serious consequences, bleeding which may require transfusion; infection which may require antibiotics; injury to bowel, bladder or other surrounding organs (bowel, bladder, ureters); injury to the fetus; need for additional procedures including hysterectomy in the event of a life-threatening hemorrhage; thromboembolic phenomenon; abnormal placentation; incisional problems; death and other postoperative or anesthesia complications.    In addition we discussed that our collective office practice is to allow patient's who desire to attempt TOLAC to go into labor naturally.  There is some limited data that rupture rate may increase past [redacted] weeks gestation, but it is reasonable for women who are strongly committed to [facility] to continue pregnancy into the 41st week.  Medical indications necessetating early delivery may arise during the course of any pregnancy.  Given the contraindication on the use of prostaglandins for use in cervical ripening,  recommendation would be to proceed with repeat cesarean for delivery for patient's with unfavorable cervix (low Bishops score) who reach 41 weeks or who otherwise have a medical indication for early delivery.   MFMU VBAC calculator success rate of 68.3% with 95% CI of 62.6% to 73.5%.  Previous studies examining have noted an increased risk or maternal and fetal morbidity for patient  attempting a trial of labor whose success rated is <70% compared to [facility], while morbidity was similar for patient attempting a trial of labor vs ERCS with success rates >70. ("Can a prediction model for vaginal birth after cesarean section also predict the probablity of  Morbidity related to a trial of labor?" American Jourcal of Obstetric and Gynecology 2009  January)  - will start with induction with pitocin - will attempt to place cervical foley catheter  2) Fetus - cat I tracing  3) PNL - Blood type --/--/A POS (03/25 1521) / Anti-bodyscreen NEG (03/25 1521) / Rubella 5.49 (08/05 1047) / Varicella Immune / RPR Non Reactive (01/04 1533) / HBsAg Negative (08/05 1047) / HIV Non Reactive (01/04 1533) / 1-hr OGTT 79 / GBS Positive/-- (03/11 0000)  4) Immunization History -  Immunization History  Administered Date(s) Administered  . DTaP 09/07/1991, 11/12/1991, 01/31/1992, 08/18/1996  . HPV Bivalent 10/31/2005, 04/07/2006, 12/22/2007  . Hepatitis B 08/18/1996  . HiB (PRP-OMP) 09/07/1991, 11/12/1991, 01/31/1992  . IPV 09/07/1991, 11/12/1991, 08/18/1996  . Influenza,inj,Quad PF,6+ Mos 11/25/2018  . Influenza-Unspecified 10/22/2015  . MMR 08/18/1996  . Meningococcal Polysaccharide 12/22/2007  . Tdap 12/22/2007, 08/07/2013, 02/08/2019    5) Disposition - pending delivery  [name], MD, [place], [facility] 04/07/2019, 8:17 AM

## 2019-04-07 NOTE — Progress Notes (Signed)
   Subjective:  Feeling increasing contractions slightly more pressure with contractions but still not painful  Objective:   Vitals: Blood pressure (!) 117/54, pulse 87, temperature 98.1 F (36.7 C), temperature source Oral, resp. rate 19, height 5\' 1"  (1.549 m), weight 114.3 kg, last menstrual period 06/26/2018. General:  NAD Abdomen: gravid, non-tender Cervical Exam:  Dilation: 1 Effacement (%): 50 Station: -3 Presentation: Vertex(verified by bedside US by MD) Exam by:: Dr.Dayshon Roback  FHT: 120, moderate, +accles, no decels Toco: q2-41min  Results for orders placed or performed during the hospital encounter of 04/07/19 (from the past 24 hour(s))  CBC     Status: Abnormal   Collection Time: 04/07/19  8:42 AM  Result Value Ref Range   WBC 10.6 (H) 4.0 - 10.5 K/uL   RBC 4.35 3.87 - 5.11 MIL/uL   Hemoglobin 12.2 12.0 - 15.0 g/dL   HCT 37.0 36.0 - 46.0 %   MCV 85.1 80.0 - 100.0 fL   MCH 28.0 26.0 - 34.0 pg   MCHC 33.0 30.0 - 36.0 g/dL   RDW 14.0 11.5 - 15.5 %   Platelets 224 150 - 400 K/uL   nRBC 0.0 0.0 - 0.2 %  Type and screen [facility]     Status: None   Collection Time: 04/07/19  8:42 AM  Result Value Ref Range   ABO/RH(D) A POS    Antibody Screen NEG    Sample Expiration      04/10/2019,2359 Performed at [facility], [address], [place], [place] [address]     Assessment:   29 y.o. [phone] [redacted]w[redacted]d IOL TOLAC unstable fetal lie  Plan:   1) Labor - continue pitocin, still not contracting painfully  2) Fetus - cat I tracing  [name], MD, [place], [facility] 04/07/2019, 5:03 PM

## 2019-04-07 NOTE — Progress Notes (Signed)
   Subjective:  Comfortable. More frequent contractions but only 3/10 as far as intesity  Objective:   Vitals: Blood pressure 107/65, pulse 83, temperature 97.9 F (36.6 C), temperature source Oral, resp. rate 19, height 5\' 1"  (1.549 m), weight 114.3 kg, last menstrual period 06/26/2018. General:  Abdomen: Cervical Exam:  Dilation: 1 Effacement (%): 50 Station: -3 Presentation: Vertex Exam by:: Tonda Wiederhold  FHT: 120, moderate, +accels, no decels Toco:q2-63min  Results for orders placed or performed during the hospital encounter of 04/07/19 (from the past 24 hour(s))  CBC     Status: Abnormal   Collection Time: 04/07/19  8:42 AM  Result Value Ref Range   WBC 10.6 (H) 4.0 - 10.5 K/uL   RBC 4.35 3.87 - 5.11 MIL/uL   Hemoglobin 12.2 12.0 - 15.0 g/dL   HCT 37.0 36.0 - 46.0 %   MCV 85.1 80.0 - 100.0 fL   MCH 28.0 26.0 - 34.0 pg   MCHC 33.0 30.0 - 36.0 g/dL   RDW 14.0 11.5 - 15.5 %   Platelets 224 150 - 400 K/uL   nRBC 0.0 0.0 - 0.2 %  Type and screen [facility]     Status: None   Collection Time: 04/07/19  8:42 AM  Result Value Ref Range   ABO/RH(D) A POS    Antibody Screen NEG    Sample Expiration      04/10/2019,2359 Performed at [facility], [address], [place],  [address]     Assessment:   29 y.o. [phone] [redacted]w[redacted]d TOLAC IOL for unstable fetal lie  Plan:   1) Labor - continue pitocin, unsuccessful at attempting foley placement given cervix posterior   2) Fetus - cat I tracing  [name], MD, [place], [facility] 04/07/2019, 1:44 PM

## 2019-04-07 NOTE — OB Triage Note (Signed)
Patient presented to L&D for scheduled IOL/ TOLAC

## 2019-04-07 NOTE — Progress Notes (Signed)
   Subjective:  Contraction still rated about a 4-5  Objective:   Vitals: Blood pressure (!) 116/55, pulse 86, temperature 98.6 F (37 C), temperature source Oral, resp. rate 18, height 5\' 1"  (1.549 m), weight 114.3 kg, last menstrual period 06/26/2018. General: well nourished, appears stated age Abdomen: gravid, soft, non-tender Cervical Exam:  Dilation: 1 Effacement (%): 50 Station: -3 Presentation: Vertex Exam by:: Steabler MD  FHT: 125, moderate, +accels, no decels Toco: q2-3 min  Results for orders placed or performed during the hospital encounter of 04/07/19 (from the past 24 hour(s))  CBC     Status: Abnormal   Collection Time: 04/07/19  8:42 AM  Result Value Ref Range   WBC 10.6 (H) 4.0 - 10.5 K/uL   RBC 4.35 3.87 - 5.11 MIL/uL   Hemoglobin 12.2 12.0 - 15.0 g/dL   HCT 37.0 36.0 - 46.0 %   MCV 85.1 80.0 - 100.0 fL   MCH 28.0 26.0 - 34.0 pg   MCHC 33.0 30.0 - 36.0 g/dL   RDW 14.0 11.5 - 15.5 %   Platelets 224 150 - 400 K/uL   nRBC 0.0 0.0 - 0.2 %  Type and screen [facility]     Status: None   Collection Time: 04/07/19  8:42 AM  Result Value Ref Range   ABO/RH(D) A POS    Antibody Screen NEG    Sample Expiration      04/10/2019,2359 Performed at [facility], [address], [place], [place] [address]     Assessment:   29 y.o. G3P2002 [redacted]w[redacted]d IOL TOLAC  Plan:   1) Labor - continue ptiocin currently at 20 milliunits.  Cervix 2.5cm but very posterior able and to patient's left.  Able to hook cervix with 1 finger.  Foley placed  2) Fetus - cat I tracing  [name], MD, [facility] 04/07/2019, 7:39 PM

## 2019-04-08 ENCOUNTER — Encounter
Admission: RE | Disposition: A | Discharge status: Home / Self Care | Payer: Self-pay | Source: Home / Self Care | Attending: Obstetrics and Gynecology

## 2019-04-08 ENCOUNTER — Encounter: Payer: Self-pay | Admitting: Obstetrics and Gynecology

## 2019-04-08 DIAGNOSIS — Z302 Encounter for sterilization: Secondary | ICD-10-CM

## 2019-04-08 DIAGNOSIS — O329XX1 Maternal care for malpresentation of fetus, unspecified, fetus 1: Secondary | ICD-10-CM | POA: Diagnosis present

## 2019-04-08 DIAGNOSIS — O99214 Obesity complicating childbirth: Secondary | ICD-10-CM

## 2019-04-08 DIAGNOSIS — O34211 Maternal care for low transverse scar from previous cesarean delivery: Secondary | ICD-10-CM

## 2019-04-08 HISTORY — PX: TUBAL LIGATION: SHX77

## 2019-04-08 LAB — CBC
HCT: 32.9 % — ABNORMAL LOW (ref 36.0–46.0)
Hemoglobin: 11.1 g/dL — ABNORMAL LOW (ref 12.0–15.0)
MCH: 28.8 pg (ref 26.0–34.0)
MCHC: 33.7 g/dL (ref 30.0–36.0)
MCV: 85.5 fL (ref 80.0–100.0)
Platelets: 196 10*3/uL (ref 150–400)
RBC: 3.85 MIL/uL — ABNORMAL LOW (ref 3.87–5.11)
RDW: 14.1 % (ref 11.5–15.5)
WBC: 18.3 10*3/uL — ABNORMAL HIGH (ref 4.0–10.5)
nRBC: 0 % (ref 0.0–0.2)

## 2019-04-08 LAB — RPR: RPR Ser Ql: NONREACTIVE

## 2019-04-08 SURGERY — Surgical Case
Anesthesia: Epidural | Site: Abdomen

## 2019-04-08 MED ORDER — MIDAZOLAM HCL 2 MG/2ML IJ SOLN
INTRAMUSCULAR | Status: AC
  Filled 2019-04-08: qty 2

## 2019-04-08 MED ORDER — SODIUM CHLORIDE 0.9% FLUSH: 3.0000 mL | INTRAVENOUS | Status: DC | PRN

## 2019-04-08 MED ORDER — NALOXONE HCL 0.4 MG/ML IJ SOLN: 0.4000 mg | INTRAMUSCULAR | Status: DC | PRN

## 2019-04-08 MED ORDER — FENTANYL CITRATE (PF) 100 MCG/2ML IJ SOLN
INTRAMUSCULAR | Status: AC
  Filled 2019-04-08: qty 2

## 2019-04-08 MED ORDER — WITCH HAZEL-GLYCERIN EX PADS: 1.0000 "application " | MEDICATED_PAD | CUTANEOUS | Status: DC | PRN

## 2019-04-08 MED ORDER — SIMETHICONE 80 MG PO CHEW: 80.0000 mg | CHEWABLE_TABLET | ORAL | Status: DC | PRN

## 2019-04-08 MED ORDER — CEFAZOLIN SODIUM-DEXTROSE 2-4 GM/100ML-% IV SOLN: 2.0000 g | INTRAVENOUS | Status: DC

## 2019-04-08 MED ORDER — ONDANSETRON HCL 4 MG/2ML IJ SOLN
INTRAMUSCULAR | Status: AC
  Filled 2019-04-08: qty 2

## 2019-04-08 MED ORDER — DIPHENHYDRAMINE HCL 25 MG PO CAPS: 25.0000 mg | ORAL_CAPSULE | ORAL | Status: DC | PRN

## 2019-04-08 MED ORDER — NALBUPHINE HCL 10 MG/ML IJ SOLN: 5.0000 mg | Freq: Once | INTRAMUSCULAR | Status: DC | PRN

## 2019-04-08 MED ORDER — DEXMEDETOMIDINE HCL IN NACL 80 MCG/20ML IV SOLN
INTRAVENOUS | Status: AC
  Filled 2019-04-08: qty 20

## 2019-04-08 MED ORDER — IBUPROFEN 800 MG PO TABS
800.0000 mg | ORAL_TABLET | Freq: Three times a day (TID) | ORAL | Status: DC
  Administered 2019-04-09 – 2019-04-10 (×5): 800 mg via ORAL
  Filled 2019-04-08 (×5): qty 1

## 2019-04-08 MED ORDER — NALBUPHINE HCL 10 MG/ML IJ SOLN: 5.0000 mg | INTRAMUSCULAR | Status: DC | PRN

## 2019-04-08 MED ORDER — OXYCODONE HCL 5 MG PO TABS
5.0000 mg | ORAL_TABLET | Freq: Once | ORAL | Status: AC
  Administered 2019-04-08: 5 mg via ORAL

## 2019-04-08 MED ORDER — BUPIVACAINE 0.25 % ON-Q PUMP DUAL CATH 400 ML
400.0000 mL | INJECTION | Status: DC
  Filled 2019-04-08: qty 400

## 2019-04-08 MED ORDER — MORPHINE SULFATE (PF) 0.5 MG/ML IJ SOLN
INTRAMUSCULAR | Status: AC
  Filled 2019-04-08: qty 10

## 2019-04-08 MED ORDER — ACETAMINOPHEN 325 MG PO TABS
325.0000 mg | ORAL_TABLET | ORAL | Status: DC | PRN
  Administered 2019-04-08: 650 mg via ORAL
  Filled 2019-04-08: qty 2

## 2019-04-08 MED ORDER — OXYTOCIN 40 UNITS IN NORMAL SALINE INFUSION - SIMPLE MED
INTRAVENOUS | Status: DC | PRN
  Administered 2019-04-08: 700 mL via INTRAVENOUS

## 2019-04-08 MED ORDER — CEFAZOLIN SODIUM-DEXTROSE 2-4 GM/100ML-% IV SOLN
2.0000 g | INTRAVENOUS | Status: AC
  Administered 2019-04-08: 2 g via INTRAVENOUS
  Filled 2019-04-08: qty 100

## 2019-04-08 MED ORDER — BUPIVACAINE HCL (PF) 0.5 % IJ SOLN
INTRAMUSCULAR | Status: AC
  Filled 2019-04-08: qty 30

## 2019-04-08 MED ORDER — LIDOCAINE HCL (PF) 2 % IJ SOLN
INTRAMUSCULAR | Status: AC
  Filled 2019-04-08: qty 5

## 2019-04-08 MED ORDER — HEMOSTATIC AGENTS (NO CHARGE) OPTIME
TOPICAL | Status: DC | PRN
  Administered 2019-04-08: 1 via TOPICAL

## 2019-04-08 MED ORDER — SIMETHICONE 80 MG PO CHEW
80.0000 mg | CHEWABLE_TABLET | Freq: Three times a day (TID) | ORAL | Status: DC
  Administered 2019-04-08 – 2019-04-10 (×7): 80 mg via ORAL
  Filled 2019-04-08 (×7): qty 1

## 2019-04-08 MED ORDER — SIMETHICONE 80 MG PO CHEW
80.0000 mg | CHEWABLE_TABLET | ORAL | Status: DC
  Administered 2019-04-08 – 2019-04-09 (×2): 80 mg via ORAL
  Filled 2019-04-08 (×2): qty 1

## 2019-04-08 MED ORDER — KETOROLAC TROMETHAMINE 30 MG/ML IJ SOLN
INTRAMUSCULAR | Status: AC
  Filled 2019-04-08: qty 1

## 2019-04-08 MED ORDER — ENOXAPARIN SODIUM 40 MG/0.4ML ~~LOC~~ SOLN
40.0000 mg | SUBCUTANEOUS | Status: DC
  Administered 2019-04-09 – 2019-04-10 (×2): 40 mg via SUBCUTANEOUS
  Filled 2019-04-08 (×3): qty 0.4

## 2019-04-08 MED ORDER — SOD CITRATE-CITRIC ACID 500-334 MG/5ML PO SOLN: 30.0000 mL | ORAL | Status: DC

## 2019-04-08 MED ORDER — MENTHOL 3 MG MT LOZG
1.0000 | LOZENGE | OROMUCOSAL | Status: DC | PRN
  Filled 2019-04-08: qty 9

## 2019-04-08 MED ORDER — SENNOSIDES-DOCUSATE SODIUM 8.6-50 MG PO TABS
2.0000 | ORAL_TABLET | ORAL | Status: DC
  Administered 2019-04-08 – 2019-04-09 (×2): 2 via ORAL
  Filled 2019-04-08 (×2): qty 2

## 2019-04-08 MED ORDER — LIDOCAINE-EPINEPHRINE (PF) 1.5 %-1:200000 IJ SOLN
INTRAMUSCULAR | Status: DC | PRN
  Administered 2019-04-07: 3 mL via EPIDURAL

## 2019-04-08 MED ORDER — PROMETHAZINE HCL 25 MG/ML IJ SOLN: 6.2500 mg | INTRAMUSCULAR | Status: DC | PRN

## 2019-04-08 MED ORDER — LIDOCAINE HCL (PF) 2 % IJ SOLN
INTRAMUSCULAR | Status: DC | PRN
  Administered 2019-04-08: 100 mg via EPIDURAL
  Administered 2019-04-08: 30 mg via EPIDURAL
  Administered 2019-04-08 (×2): 100 mg via EPIDURAL
  Administered 2019-04-08: 30 mg via EPIDURAL
  Administered 2019-04-08 (×2): 100 mg via EPIDURAL

## 2019-04-08 MED ORDER — SCOPOLAMINE 1 MG/3DAYS TD PT72: 1.0000 | MEDICATED_PATCH | Freq: Once | TRANSDERMAL | Status: DC

## 2019-04-08 MED ORDER — ACETAMINOPHEN 500 MG PO TABS
1000.0000 mg | ORAL_TABLET | Freq: Four times a day (QID) | ORAL | Status: AC
  Administered 2019-04-08 – 2019-04-09 (×3): 1000 mg via ORAL
  Filled 2019-04-08 (×3): qty 2

## 2019-04-08 MED ORDER — SODIUM CHLORIDE 0.9 % IV SOLN
500.0000 mg | Freq: Once | INTRAVENOUS | Status: DC
  Filled 2019-04-08: qty 500

## 2019-04-08 MED ORDER — PHENYLEPHRINE HCL (PRESSORS) 10 MG/ML IV SOLN
INTRAVENOUS | Status: DC | PRN
  Administered 2019-04-08: 100 ug via INTRAVENOUS

## 2019-04-08 MED ORDER — DIBUCAINE (PERIANAL) 1 % EX OINT: 1.0000 "application " | TOPICAL_OINTMENT | CUTANEOUS | Status: DC | PRN

## 2019-04-08 MED ORDER — OXYCODONE HCL 5 MG PO TABS
ORAL_TABLET | ORAL | Status: AC
  Filled 2019-04-08: qty 1

## 2019-04-08 MED ORDER — SODIUM CHLORIDE 0.9 % IV SOLN
INTRAVENOUS | Status: DC | PRN
  Administered 2019-04-08: 500 mg via INTRAVENOUS

## 2019-04-08 MED ORDER — OXYCODONE-ACETAMINOPHEN 5-325 MG PO TABS
1.0000 | ORAL_TABLET | ORAL | Status: DC | PRN
  Administered 2019-04-09 – 2019-04-10 (×7): 1 via ORAL
  Administered 2019-04-10: 2 via ORAL
  Administered 2019-04-10: 1 via ORAL
  Filled 2019-04-08 (×8): qty 1
  Filled 2019-04-08: qty 2

## 2019-04-08 MED ORDER — NALOXONE HCL 4 MG/10ML IJ SOLN
1.0000 ug/kg/h | INTRAVENOUS | Status: DC | PRN
  Filled 2019-04-08: qty 5

## 2019-04-08 MED ORDER — ZOLPIDEM TARTRATE 5 MG PO TABS: 5.0000 mg | ORAL_TABLET | Freq: Every evening | ORAL | Status: DC | PRN

## 2019-04-08 MED ORDER — KETOROLAC TROMETHAMINE 30 MG/ML IJ SOLN: 30.0000 mg | Freq: Once | INTRAMUSCULAR | Status: DC | PRN

## 2019-04-08 MED ORDER — KETOROLAC TROMETHAMINE 30 MG/ML IJ SOLN
30.0000 mg | Freq: Four times a day (QID) | INTRAMUSCULAR | Status: AC | PRN
  Administered 2019-04-08 (×2): 30 mg via INTRAVENOUS
  Filled 2019-04-08 (×2): qty 1

## 2019-04-08 MED ORDER — DIPHENHYDRAMINE HCL 25 MG PO CAPS: 25.0000 mg | ORAL_CAPSULE | Freq: Four times a day (QID) | ORAL | Status: DC | PRN

## 2019-04-08 MED ORDER — MORPHINE SULFATE (PF) 0.5 MG/ML IJ SOLN
INTRAMUSCULAR | Status: DC | PRN
  Administered 2019-04-08: 3 mg via EPIDURAL

## 2019-04-08 MED ORDER — LACTATED RINGERS IV SOLN: INTRAVENOUS | Status: DC

## 2019-04-08 MED ORDER — SODIUM CHLORIDE 0.9 % IV SOLN
INTRAVENOUS | Status: DC | PRN
  Administered 2019-04-08 (×2): 5 mL via EPIDURAL

## 2019-04-08 MED ORDER — OXYTOCIN 40 UNITS IN NORMAL SALINE INFUSION - SIMPLE MED
2.5000 [IU]/h | INTRAVENOUS | Status: AC
  Administered 2019-04-08: 2.5 [IU]/h via INTRAVENOUS
  Filled 2019-04-08: qty 1000

## 2019-04-08 MED ORDER — MIDAZOLAM HCL 2 MG/2ML IJ SOLN
INTRAMUSCULAR | Status: DC | PRN
  Administered 2019-04-08: 2 mg via INTRAVENOUS

## 2019-04-08 MED ORDER — KETOROLAC TROMETHAMINE 30 MG/ML IJ SOLN: 30.0000 mg | Freq: Four times a day (QID) | INTRAMUSCULAR | Status: AC | PRN

## 2019-04-08 MED ORDER — DIPHENHYDRAMINE HCL 50 MG/ML IJ SOLN: 12.5000 mg | INTRAMUSCULAR | Status: DC | PRN

## 2019-04-08 MED ORDER — FENTANYL CITRATE (PF) 100 MCG/2ML IJ SOLN
INTRAMUSCULAR | Status: DC | PRN
  Administered 2019-04-08: 25 ug via EPIDURAL
  Administered 2019-04-08: 50 ug via EPIDURAL
  Administered 2019-04-08: 25 ug via EPIDURAL
  Administered 2019-04-08 (×2): 100 ug via EPIDURAL

## 2019-04-08 MED ORDER — COCONUT OIL OIL
1.0000 "application " | TOPICAL_OIL | Status: DC | PRN
  Administered 2019-04-08: 1 via TOPICAL
  Filled 2019-04-08: qty 120

## 2019-04-08 MED ORDER — OXYTOCIN 40 UNITS IN NORMAL SALINE INFUSION - SIMPLE MED
INTRAVENOUS | Status: AC
  Filled 2019-04-08: qty 1000

## 2019-04-08 MED ORDER — SODIUM CHLORIDE 0.9 % IV SOLN
INTRAVENOUS | Status: DC | PRN
  Administered 2019-04-08: 30 ug/min via INTRAVENOUS

## 2019-04-08 MED ORDER — PRENATAL MULTIVITAMIN CH
1.0000 | ORAL_TABLET | Freq: Every day | ORAL | Status: DC
  Administered 2019-04-08 – 2019-04-10 (×3): 1 via ORAL
  Filled 2019-04-08 (×3): qty 1

## 2019-04-08 MED ORDER — SOD CITRATE-CITRIC ACID 500-334 MG/5ML PO SOLN
30.0000 mL | ORAL | Status: AC
  Administered 2019-04-08: 30 mL via ORAL

## 2019-04-08 MED ORDER — ACETAMINOPHEN 160 MG/5ML PO SOLN
325.0000 mg | ORAL | Status: DC | PRN
  Filled 2019-04-08: qty 20.3

## 2019-04-08 MED ORDER — BUPIVACAINE HCL (PF) 0.5 % IJ SOLN: 20.0000 mL | INTRAMUSCULAR | Status: DC

## 2019-04-08 MED ORDER — ONDANSETRON HCL 4 MG/2ML IJ SOLN: 4.0000 mg | Freq: Three times a day (TID) | INTRAMUSCULAR | Status: DC | PRN

## 2019-04-08 MED ORDER — BUPIVACAINE HCL (PF) 0.5 % IJ SOLN
INTRAMUSCULAR | Status: DC | PRN
  Administered 2019-04-08: 10 mL

## 2019-04-08 SURGICAL SUPPLY — 36 items
BAG COUNTER SPONGE EZ (MISCELLANEOUS) ×4 IMPLANT
CANISTER SUCT 3000ML PPV (MISCELLANEOUS) ×4 IMPLANT
CATH KIT ON-Q SILVERSOAK 5 (CATHETERS) ×4 IMPLANT
CATH KIT ON-Q SILVERSOAK 5IN (CATHETERS) ×8 IMPLANT
CHLORAPREP W/TINT 26 (MISCELLANEOUS) ×8 IMPLANT
CLOSURE WOUND 1/2 X4 (GAUZE/BANDAGES/DRESSINGS) ×1
COUNTER SPONGE BAG EZ (MISCELLANEOUS) ×2
DERMABOND ADVANCED (GAUZE/BANDAGES/DRESSINGS) ×4
DERMABOND ADVANCED .7 DNX12 (GAUZE/BANDAGES/DRESSINGS) ×2 IMPLANT
DRSG OPSITE POSTOP 4X10 (GAUZE/BANDAGES/DRESSINGS) ×4 IMPLANT
DRSG TELFA 3X8 NADH (GAUZE/BANDAGES/DRESSINGS) ×4 IMPLANT
ELECT CAUTERY BLADE 6.4 (BLADE) ×4 IMPLANT
ELECT REM PT RETURN 9FT ADLT (ELECTROSURGICAL) ×4
ELECTRODE REM PT RTRN 9FT ADLT (ELECTROSURGICAL) ×2 IMPLANT
GAUZE SPONGE 4X4 12PLY STRL (GAUZE/BANDAGES/DRESSINGS) ×2 IMPLANT
GLOVE BIO SURGEON STRL SZ7 (GLOVE) ×8 IMPLANT
GLOVE INDICATOR 7.5 STRL GRN (GLOVE) ×8 IMPLANT
GOWN STRL REUS W/ TWL LRG LVL3 (GOWN DISPOSABLE) ×6 IMPLANT
GOWN STRL REUS W/TWL LRG LVL3 (GOWN DISPOSABLE) ×6
HEMOSTAT SURGICEL 2X3 (HEMOSTASIS) ×2 IMPLANT
NS IRRIG 1000ML POUR BTL (IV SOLUTION) ×4 IMPLANT
PACK C SECTION AR (MISCELLANEOUS) ×4 IMPLANT
PAD DRESSING TELFA 3X8 NADH (GAUZE/BANDAGES/DRESSINGS) ×2 IMPLANT
PAD OB MATERNITY 4.3X12.25 (PERSONAL CARE ITEMS) ×4 IMPLANT
PAD PREP 24X41 OB/GYN DISP (PERSONAL CARE ITEMS) ×4 IMPLANT
PENCIL SMOKE ULTRAEVAC 22 CON (MISCELLANEOUS) ×4 IMPLANT
RETRACTOR TRAXI PANNICULUS (MISCELLANEOUS) IMPLANT
SPONGE LAP 18X18 RF (DISPOSABLE) ×2 IMPLANT
STRIP CLOSURE SKIN 1/2X4 (GAUZE/BANDAGES/DRESSINGS) ×3 IMPLANT
SUT MNCRL AB 4-0 PS2 18 (SUTURE) ×4 IMPLANT
SUT PDS AB 1 TP1 96 (SUTURE) ×8 IMPLANT
SUT PLAIN 2 0 XLH (SUTURE) ×2 IMPLANT
SUT VIC AB 0 CTX 36 (SUTURE) ×4
SUT VIC AB 0 CTX36XBRD ANBCTRL (SUTURE) ×4 IMPLANT
SUT VIC AB 2-0 CT1 36 (SUTURE) ×2 IMPLANT
TRAXI PANNICULUS RETRACTOR (MISCELLANEOUS) ×2

## 2019-04-08 NOTE — Lactation Note (Signed)
This note was copied from a baby's chart. Lactation Consultation Note  Patient Name: Boy Kacyn Fujii [id number] Date: 04/08/2019 Reason for consult: Follow-up assessment;Term   [facility] student entered room to follow-up from visit earlier today. Mother resting in bed and baby asleep in bassinet. Family welcomed [facility] student to proceed into room.   MOB reports baby had a successful feed ~12pm that she feels good about. Denies any pain or discomfort with latch.   MOB would like to rest at this time, so [facility] student briefly discussed the basics of breastfeeding with family. Encouraged 8-12 feeding attempts in a day. Discussed expected baby behavior during early newborn period, benefits of skin to skin, and watching for feeding cues. Hand expression demonstrated again and encouraged MOB to perform frequently as well.   No further questions at this time. Informed that [facility] support is available until 5pm today if needed, but will have follow-up expected tomorrow as well.   Maternal Data Formula Feeding for Exclusion: No Has patient been taught Hand Expression?: Yes Does the patient have breastfeeding experience prior to this delivery?: Yes  Feeding Feeding Type: Breast Fed  LATCH Score                   Interventions Interventions: Breast feeding basics reviewed;Skin to skin;Breast massage;Hand express  Lactation Tools Discussed/Used     Consult Status Consult Status: Follow-up Date: 04/08/19 Follow-up type: [facility] 04/08/2019, 3:14 PM

## 2019-04-08 NOTE — Progress Notes (Signed)
   Subjective:  Comfortable, feeling more pressure now  Objective:   Vitals: Blood pressure 120/69, pulse (!) 106, temperature 98.9 F (37.2 C), temperature source Axillary, resp. rate 18, height 5\' 1"  (1.549 m), weight 114.3 kg, last menstrual period 06/26/2018, SpO2 99 %. General: NAD Abdomen: gravid, non-tender Cervical Exam:  Dilation: 5 Effacement (%): 80 Cervical Position: Middle Station: -2 Presentation: Vertex Exam by:: B Darnell RN Still ROP positioning  FHT: 120, moderate, +accels, early deceleration but also late decelerations Toco: q2-64min  Results for orders placed or performed during the hospital encounter of 04/07/19 (from the past 24 hour(s))  CBC     Status: Abnormal   Collection Time: 04/07/19  8:42 AM  Result Value Ref Range   WBC 10.6 (H) 4.0 - 10.5 K/uL   RBC 4.35 3.87 - 5.11 MIL/uL   Hemoglobin 12.2 12.0 - 15.0 g/dL   HCT 37.0 36.0 - 46.0 %   MCV 85.1 80.0 - 100.0 fL   MCH 28.0 26.0 - 34.0 pg   MCHC 33.0 30.0 - 36.0 g/dL   RDW 14.0 11.5 - 15.5 %   Platelets 224 150 - 400 K/uL   nRBC 0.0 0.0 - 0.2 %  Type and screen [place]     Status: None   Collection Time: 04/07/19  8:42 AM  Result Value Ref Range   ABO/RH(D) A POS    Antibody Screen NEG    Sample Expiration      04/10/2019,2359 Performed at [facility], [address] [place], [place] [address]     Assessment:   29 y.o. [id number] [redacted]w[redacted]d TOLAC IOL unstable fetal lie  Plan:   1) Labor - continue pitocin 1/2 dose 10 milliunit's and will titrate back up based on MVU and cervical change.  2) Fetus - cat II tracing  [name], MD, [facility] 04/08/2019, 5:47 AM

## 2019-04-08 NOTE — Progress Notes (Signed)
   Subjective:  Comfortable other than feeling more pressure  Objective:   Vitals: Blood pressure 110/76, pulse (!) 101, temperature 98.2 F (36.8 C), temperature source Oral, resp. rate 18, height 5\' 1"  (1.549 m), weight 114.3 kg, last menstrual period 06/26/2018, SpO2 98 %. General: NAD Abdomen: gravid, non-tender Cervical Exam:  Dilation: 7 Effacement (%): 100 Cervical Position: Middle Station: -1 Presentation: Vertex Exam by:: William Schake MD Cervix remains stable from last check 45 minutes ago  FHT: 120, moderate, no accels, deep variables  Toco: q36min  Results for orders placed or performed during the hospital encounter of 04/07/19 (from the past 24 hour(s))  CBC     Status: Abnormal   Collection Time: 04/07/19  8:42 AM  Result Value Ref Range   WBC 10.6 (H) 4.0 - 10.5 K/uL   RBC 4.35 3.87 - 5.11 MIL/uL   Hemoglobin 12.2 12.0 - 15.0 g/dL   HCT 37.0 36.0 - 46.0 %   MCV 85.1 80.0 - 100.0 fL   MCH 28.0 26.0 - 34.0 pg   MCHC 33.0 30.0 - 36.0 g/dL   RDW 14.0 11.5 - 15.5 %   Platelets 224 150 - 400 K/uL   nRBC 0.0 0.0 - 0.2 %  Type and screen [place]     Status: None   Collection Time: 04/07/19  8:42 AM  Result Value Ref Range   ABO/RH(D) A POS    Antibody Screen NEG    Sample Expiration      04/10/2019,2359 Performed at [facility], [address] [place], [place] [address]     Assessment:   29 y.o. [phone] [redacted]w[redacted]d IOL TOLAC unstable fetal lie, now with recurrent variables that have not responded to position changes  Plan:   1) Labor - The patient was counseled regarding risk and benefits to proceeding with Cesarean section to expedite delivery.  Risk of cesarean section were discussed including risk of bleeding and need for potential intraoperative or postoperative blood transfusion with a rate of approximately 5% quoted for all Cesarean sections, risk of injury to adjacent organs including but not limited to bowl and bladder, the  need for additional surgical procedures to address such injuries, and the risk of infection.  The risk of continued attempts at vaginal delivery include but are note limited to worsening fetal or maternal status.  After consideration of options the patient is amenable to proceed with primary cesarean section for delivery.   2) Fetus - discussed implication of variables, she also has had some subtle late deceleration following some of these. I discussed the pathophiosiology of variable with the patient.  Usually these are indicative of cord compression.  If the cord is being compressed by the fetal body position changes and or amnio-infusion may improve or resolve the variables.  However, if a nuchal cord is responsible these may worsen as we get to a lower station.  Currently the baby is displaying good fetal scalp stimulation and variability so we discussed that current oxygenation status is good.  After taking these into consideration the patient opts to abort TOLAC and proceed with RLTCS & BTL for delivery  [name], MD, [place] 04/08/2019, 6:12 AM

## 2019-04-08 NOTE — Transfer of Care (Signed)
Immediate Anesthesia Transfer of Care Note  Patient: Breanna Lamb  Procedure(s) Performed: CESAREAN SECTION WITH BILATERAL TUBAL LIGATION (Abdomen)  Patient Location: PACU and Labor and Delivery  Anesthesia Type:Epidural  Level of Consciousness: awake, alert  and oriented  Airway & Oxygen Therapy: Patient Spontanous Breathing  Post-op Assessment: Report given to RN  Post vital signs: Reviewed and stable  Last Vitals:  Vitals Value Taken Time  BP 124/73 04/08/19 0833  Temp 36.8 C 04/08/19 0833  Pulse    Resp 18 04/08/19 0833  SpO2 99 % 04/08/19 0833    Last Pain:  Vitals:   04/08/19 0833  TempSrc: Oral  PainSc:          Complications: No apparent anesthesia complications

## 2019-04-08 NOTE — Anesthesia Postprocedure Evaluation (Signed)
Anesthesia Post Note  Patient: [name]  Procedure(s) Performed: CESAREAN SECTION WITH BILATERAL TUBAL LIGATION (Abdomen)  Patient location during evaluation: Mother Baby Anesthesia Type: Epidural Level of consciousness: oriented and awake and alert Pain management: pain level controlled Vital Signs Assessment: post-procedure vital signs reviewed and stable Respiratory status: spontaneous breathing and respiratory function stable Cardiovascular status: blood pressure returned to baseline and stable Postop Assessment: no headache, no backache, no apparent nausea or vomiting and epidural receding Anesthetic complications: no     Last Vitals:  Vitals:   04/08/19 1215 04/08/19 1320  BP: (!) 105/56 112/62  Pulse: 82 86  Resp: 18 18  Temp: 36.8 C 36.5 C  SpO2: 99% 98%    Last Pain:  Vitals:   04/08/19 1320  TempSrc: Oral  PainSc:                  Alphonsus Sias

## 2019-04-08 NOTE — Op Note (Addendum)
Preoperative Diagnosis: 1) 29 y.o. G2002 at [redacted]w[redacted]d fetal intolerance to labor  2) Failed TOLAC 3) Obesity Body mass index is 47.61 kg/m. 4) History of prior cesarean section 5) Undesired fertility  Postoperative Diagnosis: 1) 29 y.o. [id number] at [redacted]w[redacted]d etal intolerance to labor  2) Failed TOLAC 3) Obesity Body mass index is 47.61 kg/m. 4) History of prior cesarean section 5) Undesired fertility  Operation Performed: Repeat low transverse C-section via pfannenstiel skin incision and bilateral tubal ligations  Indiciation:  Anesthesia: .Epidural  Primary Surgeon: [name], MD   Assistant: Rod Can CNM this surgery required a high level surgical assistant with none other readily avialable  Preoperative Antibiotics: 2g ancef and 500mg  of azithromycin  Estimated Blood Loss: 1058mL  IV Fluids: 1L  Drains or Tubes: Foley to gravity drainage, ON-Q catheter system  Implants: none  Specimens Removed: bilateral portion of fallopian tubes  Complications: none  Intraoperative Findings:  Normal tubes ovaries and uterus.  Delivery resulted in the birth of a liveborn female, APGAR (1 MIN): 9   APGAR (5 MINS): 9    Patient Condition:stable  Procedure in Detail:  Patient was taken to the operating room were she was administered regional anesthesia.  She was positioned in the supine position, prepped and draped in the  Usual sterile fashion.  Prior to proceeding with the case a time out was performed and the level of anesthetic was checked and noted to be adequate.  Utilizing the scalpel a pfannenstiel skin incision was made 2cm above the pubic symphysis utilizing the patient's pre-existing scar and carried down sharply to the the level of the rectus fascia.  The fascia was incised in the midline using the scalpel and then extended using mayo scissors.  The superior border of the rectus fascia was grasped with two Kocher clamps and the underlying rectus muscles were dissected of  the fascia using blunt dissection.  The median raphae was incised using Mayo scissors.   The inferior border of the rectus fascia was dissected of the rectus muscles in a similar fashion.  The midline was identified, the peritoneum was entered bluntly and expanded using manual tractions.  The uterus was noted to be in a none rotated position.  Next the bladder blade was placed retracting the bladder caudally.  A bladder flap was not created.  A low transverse incision was scored on the lower uterine segment.  The hysterotomy was entered bluntly using the operators finger.  The hysterotomy incision was extended using manual traction.  The operators hand was placed within the hysterotomy position noting the fetus to be within the OP position.  The vertex was grasped, flexed, brought to the incision, and delivered a traumatically using fundal pressure.  A nuchal cord x 1 was reduced. The remainder of the body delivered with ease.  The cord was delayed clamped and cut before handing off the infant to the awaiting neonatologist.  The placenta was delivered using manual extraction.  The uterus was exteriorized, wiped clean of clots and debris using two moist laps.  The hysterotomy was closed using a two layer closure of 0 Vicryl, with the first being a running locked, the second a vertical imbricating. The right tube was grasped in a mid isthmic portion using a Babcock clamp, before being double suture ligated using a 0 chromic wheel.  The intervening nuckel of tube was excised using Metzenbaum scissors.  Complete cross section of tubal ostia visualized, and noted to be hemostatic  This procedure was then repeated  in similar fashion for the patient left tube.    The uterus was returned to the abdomen.  The peritoneal gutters were wiped clean of clots and debris using two moist laps.  A figure of 8 suture of 0 Vicryl was applied to the right hysterotomy edge and surgicel was placed.  The hysterotomy incision was  re-inspected noted to be hemostatic.  The rectus muscles were inspected noted to be hemostatic.  The superior border of the rectus fascia was grasped with a Kocher clamp.  The ON-Q trocars were then placed 4cm above the superior border of the incision and tunneled subfascially.  The introducers were removed and the catheters were threaded through the sleeves after which the sleeves were removed.  The fascia was closed using a looped #1 PDS in a running fashion taking 1cm by 1cm bites.  The subcutaneous tissue was irrigated using warm saline, hemostasis achieved using the bovie.  The subcutaneous dead space was greater than 3cm and was  closed.  The skin was closed using 4-0 Monocryl in a subcuticular fashion.  Sponge needle and instrument counts were corrects times two.  The patient tolerated the procedure well and was taken to the recovery room in stable condition.

## 2019-04-08 NOTE — Progress Notes (Addendum)
   Subjective:  No concerns, very dense epidural, comfortable  Objective:   Vitals: Blood pressure 120/69, pulse (!) 106, temperature 98.5 F (36.9 C), temperature source Oral, resp. rate 18, height 5\' 1"  (1.549 m), weight 114.3 kg, last menstrual period 06/26/2018, SpO2 99 %. General: NAD, comfortable with epidural in place Abdomen: gravid, soft Cervical Exam:  Dilation: 5 Effacement (%): 80 Cervical Position: Middle Station: -2 Presentation: Vertex Exam by:: B Darnell RN ROP positioning, cervix now anterior and midline significant change since AROM.  FHT: 120, moderate, +accels, occasional subtle dips early vs lates.  Given ROP positioning and remainder of strip favor earlies.  Good fetal scalp stimulation Toco: q53min not picking up well on side  Results for orders placed or performed during the hospital encounter of 04/07/19 (from the past 24 hour(s))  CBC     Status: Abnormal   Collection Time: 04/07/19  8:42 AM  Result Value Ref Range   WBC 10.6 (H) 4.0 - 10.5 K/uL   RBC 4.35 3.87 - 5.11 MIL/uL   Hemoglobin 12.2 12.0 - 15.0 g/dL   HCT 37.0 36.0 - 46.0 %   MCV 85.1 80.0 - 100.0 fL   MCH 28.0 26.0 - 34.0 pg   MCHC 33.0 30.0 - 36.0 g/dL   RDW 14.0 11.5 - 15.5 %   Platelets 224 150 - 400 K/uL   nRBC 0.0 0.0 - 0.2 %  Type and screen [place]     Status: None   Collection Time: 04/07/19  8:42 AM  Result Value Ref Range   ABO/RH(D) A POS    Antibody Screen NEG    Sample Expiration      04/10/2019,2359 Performed at [facility], [address] [place], [place] [address]     Assessment:   29 y.o. [phone] [redacted]w[redacted]d TOLAC IOL for unstable fetal lie  Plan:   1) Labor - continue pitocin, made significant   2) Fetus - cat I tracing  3) Hypotension - post epidural received received 3 doses of ephedrine but BP now recovered   [name], MD, [place], [facility] 04/08/2019, 3:50 AM

## 2019-04-08 NOTE — Lactation Note (Signed)
This note was copied from a baby's chart. Lactation Consultation Note  Patient Name: Breanna Lamb [id number] Date: 04/08/2019 Reason for consult: Initial assessment   Lactation called to room. LC and LC entered labor room for initial assessment and help with breastfeeding for G3P3 mom.  Baby is now 2 HOL s/p C/S delivery. MOB has history of breastfeeding her two older children for a few months with no reported challenges.   MOB requesting to attempt feeding in football hold. Assisted with postioning on left side first with baby skin-to-skin. Baby will open wide, suck on nipple 1-2x and then fall back asleep. Attempted to stimulate baby and express colostrum while baby at breast. Attempted to feed in football and modified laid-back on right side. No latch achieved after trying for 15-20 min.   Baby appears sleepy at this time and still working up amniotic fluid in his mouth. Not showing strong interest in eating at this time.  Reviewed basics of breastfeeding and expectations at this hour of life. Reassured MOB that baby is demonstrating normal behavior and that lactation will be back to support once on  MBU today.   MOB denies any further questions or concerns at this time. Baby left skin-to-skin on mother's chest.   Lactation to follow-up today.   Maternal Data Formula Feeding for Exclusion: No Has patient been taught Hand Expression?: Yes  Feeding Feeding Type: Breast Fed  LATCH Score Latch: Too sleepy or reluctant, no latch achieved, no sucking elicited.  Audible Swallowing: None  Type of Nipple: Everted at rest and after stimulation  Comfort (Breast/Nipple): Soft / non-tender  Hold (Positioning): Assistance needed to correctly position infant at breast and maintain latch.  LATCH Score: 5  Interventions Interventions: Breast feeding basics reviewed;Assisted with latch;Skin to skin;Breast massage;Hand express;Adjust position;Support pillows;Position options;Expressed  milk  Lactation Tools Discussed/Used     Consult Status Consult Status: Follow-up Date: 04/08/19 Follow-up type: In-patient    [place] Student  04/08/2019, 10:46 AM

## 2019-04-08 NOTE — Anesthesia Preprocedure Evaluation (Addendum)
Anesthesia Evaluation  Patient identified by MRN, date of birth, ID band Patient awake    Reviewed: Allergy & Precautions, H&P , NPO status , Patient's Chart, lab work & pertinent test results  History of Anesthesia Complications Negative for: history of anesthetic complications  Airway Mallampati: III  TM Distance: >3 FB Neck ROM: full    Dental  (+) Chipped   Pulmonary asthma ,           Cardiovascular Exercise Tolerance: Good (-) hypertensionnegative cardio ROS       Neuro/Psych  Headaches, PSYCHIATRIC DISORDERS    GI/Hepatic negative GI ROS, neg GERD  ,  Endo/Other  Morbid obesity  Renal/GU   negative genitourinary   Musculoskeletal   Abdominal   Peds  Hematology negative hematology ROS (+)   Anesthesia Other Findings TOLAC  Past Medical History: No date: Allergic rhinitis No date: Asthma     Comment:  childhood No date: Benign positional vertigo No date: Bunion No date: Contraceptive management No date: Depression No date: Genital herpes No date: Henoch-Schonlein purpura (Stem) 11/19/2018: Low-lying placenta     Comment:  Resolved 23 week anatomy follow up No date: Lymphadenopathy No date: Neck pain No date: Obesity No date: Ovarian cyst, right No date: Poor concentration No date: Premenstrual dysphoric syndrome No date: Premenstrual dysphoric syndrome No date: Screening for venereal disease No date: Skin lesion  Past Surgical History: 2010 and 2011: BUNIONECTOMY No date: CESAREAN SECTION 01/2014: mirena IUD August 2014: OVARIAN CYST REMOVAL 2014: WISDOM TOOTH EXTRACTION  BMI    Body Mass Index: 47.61 kg/m      Reproductive/Obstetrics (+) Pregnancy                            Anesthesia Physical Anesthesia Plan  ASA: III  Anesthesia Plan: Epidural   Post-op Pain Management:    Induction:   PONV Risk Score and Plan:   Airway Management Planned:  Natural Airway  Additional Equipment:   Intra-op Plan:   Post-operative Plan:   Informed Consent: I have reviewed the patients History and Physical, chart, labs and discussed the procedure including the risks, benefits and alternatives for the proposed anesthesia with the patient or authorized representative who has indicated his/her understanding and acceptance.     Dental Advisory Given  Plan Discussed with: Anesthesiologist  Anesthesia Plan Comments: (Patient reports no bleeding problems and no anticoagulant use.  Patient consented for risks of anesthesia including but not limited to:  - adverse reactions to medications - risk of bleeding, infection, nerve damage and headache - risk of failed epidural - damage to teeth, lips or other oral mucosa - sore throat or hoarseness - Damage to heart, brain, lungs or loss of life  Patient voiced understanding.)       Anesthesia Quick Evaluation

## 2019-04-08 NOTE — Anesthesia Procedure Notes (Signed)
Date/Time: 04/08/2019 6:52 AM Performed by: Doreen Salvage, CRNA Pre-anesthesia Checklist: Patient identified, Emergency Drugs available, Suction available and Patient being monitored Patient Re-evaluated:Patient Re-evaluated prior to induction Oxygen Delivery Method: Nasal cannula Induction Type: IV induction Dental Injury: Teeth and Oropharynx as per pre-operative assessment  Comments: Nasal cannula with etCO2 monitoring

## 2019-04-08 NOTE — Anesthesia Procedure Notes (Signed)
Epidural Patient location during procedure: OB Start time: 04/07/2019 11:54 PM End time: 04/07/2019 11:57 PM  Staffing Anesthesiologist: Leontine Radman, Precious Haws, MD Performed: anesthesiologist   Preanesthetic Checklist Completed: patient identified, IV checked, site marked, risks and benefits discussed, surgical consent, monitors and equipment checked, pre-op evaluation and timeout performed  Epidural Patient position: sitting Prep: ChloraPrep Patient monitoring: heart rate, continuous pulse ox and blood pressure Approach: midline Location: L3-L4 Injection technique: LOR saline  Needle:  Needle type: Tuohy  Needle gauge: 17 G Needle length: 9 cm and 9 Needle insertion depth: 9 cm Catheter type: closed end flexible Catheter size: 19 Gauge Catheter at skin depth: 15 cm Test dose: negative and 1.5% lidocaine with Epi 1:200 K  Assessment Sensory level: T10 Events: blood not aspirated, injection not painful, no injection resistance, no paresthesia and negative IV test  Additional Notes 1 attempt Pt. Evaluated and documentation done after procedure finished. Patient identified. Risks/Benefits/Options discussed with patient including but not limited to bleeding, infection, nerve damage, paralysis, failed block, incomplete pain control, headache, blood pressure changes, nausea, vomiting, reactions to medication both or allergic, itching and postpartum back pain. Confirmed with bedside nurse the patient's most recent platelet count. Confirmed with patient that they are not currently taking any anticoagulation, have any bleeding history or any family history of bleeding disorders. Patient expressed understanding and wished to proceed. All questions were answered. Sterile technique was used throughout the entire procedure. Please see nursing notes for vital signs. Test dose was given through epidural catheter and negative prior to continuing to dose epidural or start infusion. Warning signs of  high block given to the patient including shortness of breath, tingling/numbness in hands, complete motor block, or any concerning symptoms with instructions to call for help. Patient was given instructions on fall risk and not to get out of bed. All questions and concerns addressed with instructions to call with any issues or inadequate analgesia.   Patient tolerated the insertion well without immediate complications.Reason for block:procedure for pain

## 2019-04-09 LAB — CBC
HCT: 26.5 % — ABNORMAL LOW (ref 36.0–46.0)
Hemoglobin: 8.5 g/dL — ABNORMAL LOW (ref 12.0–15.0)
MCH: 28.3 pg (ref 26.0–34.0)
MCHC: 32.1 g/dL (ref 30.0–36.0)
MCV: 88.3 fL (ref 80.0–100.0)
Platelets: 165 10*3/uL (ref 150–400)
RBC: 3 MIL/uL — ABNORMAL LOW (ref 3.87–5.11)
RDW: 14.4 % (ref 11.5–15.5)
WBC: 10.6 10*3/uL — ABNORMAL HIGH (ref 4.0–10.5)
nRBC: 0 % (ref 0.0–0.2)

## 2019-04-09 LAB — SURGICAL PATHOLOGY

## 2019-04-09 LAB — RPR: RPR Ser Ql: NONREACTIVE

## 2019-04-09 NOTE — Progress Notes (Signed)
Subjective:  Doing well.  Tolerating po.  Some left sided soreness.  Minimal lochia.  Voiding spontaneously.    Objective:  Vital signs in last 24 hours: Temp:  [97.7 F (36.5 C)-98.4 F (36.9 C)] 97.9 F (36.6 C) (04/02 0758) Pulse Rate:  [82-102] 90 (04/02 0758) Resp:  [18-29] 20 (04/02 0758) BP: (91-147)/(48-95) 112/65 (04/02 0758) SpO2:  [96 %-100 %] 99 % (04/02 0758)    Intake/Output      04/01 0701 - 04/02 0700 04/02 0701 - 04/03 0700   P.O. 710    I.V. (mL/kg) 300 (2.6)    IV Piggyback     Total Intake(mL/kg) 1010 (8.8)    Urine (mL/kg/hr) 2250 (0.8)    Blood 1080    Total Output 3330    Net -2320           General: NAD Pulmonary: no increased work of breathing Abdomen: non-distended, non-tender, fundus firm at level of umbilicus Incision: D/C/I Extremities: no edema, no erythema, no tenderness  Results for orders placed or performed during the hospital encounter of 04/07/19 (from the past 72 hour(s))  CBC     Status: Abnormal   Collection Time: 04/07/19  8:42 AM  Result Value Ref Range   WBC 10.6 (H) 4.0 - 10.5 K/uL   RBC 4.35 3.87 - 5.11 MIL/uL   Hemoglobin 12.2 12.0 - 15.0 g/dL   HCT 37.0 36.0 - 46.0 %   MCV 85.1 80.0 - 100.0 fL   MCH 28.0 26.0 - 34.0 pg   MCHC 33.0 30.0 - 36.0 g/dL   RDW 14.0 11.5 - 15.5 %   Platelets 224 150 - 400 K/uL   nRBC 0.0 0.0 - 0.2 %    Comment: Performed at [facility], [address] [place], [place] [address]  Type and screen [place]     Status: None   Collection Time: 04/07/19  8:42 AM  Result Value Ref Range   ABO/RH(D) A POS    Antibody Screen NEG    Sample Expiration      04/10/2019,2359 Performed at [facility], [address] [place], [place] [address]   RPR     Status: None   Collection Time: 04/07/19  8:42 AM  Result Value Ref Range   RPR Ser Ql NON REACTIVE NON REACTIVE    Comment: Performed at [facility], [address], [place], [place]  [address]  CBC     Status: Abnormal   Collection Time: 04/08/19  9:07 AM  Result Value Ref Range   WBC 18.3 (H) 4.0 - 10.5 K/uL   RBC 3.85 (L) 3.87 - 5.11 MIL/uL   Hemoglobin 11.1 (L) 12.0 - 15.0 g/dL   HCT 32.9 (L) 36.0 - 46.0 %   MCV 85.5 80.0 - 100.0 fL   MCH 28.8 26.0 - 34.0 pg   MCHC 33.7 30.0 - 36.0 g/dL   RDW 14.1 11.5 - 15.5 %   Platelets 196 150 - 400 K/uL   nRBC 0.0 0.0 - 0.2 %    Comment: Performed at [facility], [address] [place],  [address]  CBC     Status: Abnormal   Collection Time: 04/09/19  6:28 AM  Result Value Ref Range   WBC 10.6 (H) 4.0 - 10.5 K/uL   RBC 3.00 (L) 3.87 - 5.11 MIL/uL   Hemoglobin 8.5 (L) 12.0 - 15.0 g/dL   HCT 26.5 (L) 36.0 - 46.0 %  MCV 88.3 80.0 - 100.0 fL   MCH 28.3 26.0 - 34.0 pg   MCHC 32.1 30.0 - 36.0 g/dL   RDW 14.4 11.5 - 15.5 %   Platelets 165 150 - 400 K/uL   nRBC 0.0 0.0 - 0.2 %    Comment: Performed at [facility], [address], [place], [place] [address]    Immunization History  Administered Date(s) Administered  . DTaP 09/07/1991, 11/12/1991, 01/31/1992, 08/18/1996  . HPV Bivalent 10/31/2005, 04/07/2006, 12/22/2007  . Hepatitis B 08/18/1996  . HiB (PRP-OMP) 09/07/1991, 11/12/1991, 01/31/1992  . IPV 09/07/1991, 11/12/1991, 08/18/1996  . Influenza,inj,Quad PF,6+ Mos 11/25/2018  . Influenza-Unspecified 10/22/2015  . MMR 08/18/1996  . Meningococcal Polysaccharide 12/22/2007  . Tdap 12/22/2007, 08/07/2013, 02/08/2019    Assessment:   29 y.o. G3P3003 postoperativeday # 1 RLTCS & BTL   Plan:  ) Acute blood loss anemia - hemodynamically stable and asymptomatic - po ferrous sulfate  2) Blood Type --/--/A POS (03/31 7473) / Rubella 5.49 (08/05 1047) / Varicella Immune  3) TDAP status up to date  4) Feeding plan breast  5)  Education given regarding options for contraception, as well as compatibility with breast feeding if applicable.  Status post BTL  6) Disposition anticipate  discharge POD 2-3  [name], MD, [facility] 04/09/2019, 9:47 AM

## 2019-04-10 DIAGNOSIS — Z9851 Tubal ligation status: Secondary | ICD-10-CM

## 2019-04-10 LAB — CBC
HCT: 25.3 % — ABNORMAL LOW (ref 36.0–46.0)
Hemoglobin: 8.4 g/dL — ABNORMAL LOW (ref 12.0–15.0)
MCH: 29.1 pg (ref 26.0–34.0)
MCHC: 33.2 g/dL (ref 30.0–36.0)
MCV: 87.5 fL (ref 80.0–100.0)
Platelets: 169 10*3/uL (ref 150–400)
RBC: 2.89 MIL/uL — ABNORMAL LOW (ref 3.87–5.11)
RDW: 14.6 % (ref 11.5–15.5)
WBC: 8.7 10*3/uL (ref 4.0–10.5)
nRBC: 0 % (ref 0.0–0.2)

## 2019-04-10 MED ORDER — FERROUS SULFATE 325 (65 FE) MG PO TABS: 325.0000 mg | ORAL_TABLET | Freq: Every day | ORAL | 1 refills | Status: DC

## 2019-04-10 MED ORDER — IBUPROFEN 800 MG PO TABS: 800.0000 mg | ORAL_TABLET | Freq: Three times a day (TID) | ORAL | 0 refills | Status: DC | PRN

## 2019-04-10 MED ORDER — OXYCODONE HCL 5 MG PO TABS: 5.0000 mg | ORAL_TABLET | Freq: Four times a day (QID) | ORAL | 0 refills | Status: AC | PRN

## 2019-04-10 MED ORDER — POLYETHYLENE GLYCOL 3350 17 G PO PACK
17.0000 g | PACK | Freq: Every day | ORAL | Status: DC
  Administered 2019-04-10: 17 g via ORAL
  Filled 2019-04-10: qty 1

## 2019-04-10 MED ORDER — POLYETHYLENE GLYCOL 3350 17 G PO PACK: 17.0000 g | PACK | Freq: Every day | ORAL | 0 refills | Status: DC

## 2019-04-10 MED ORDER — FERROUS SULFATE 325 (65 FE) MG PO TABS
325.0000 mg | ORAL_TABLET | Freq: Two times a day (BID) | ORAL | Status: DC
  Administered 2019-04-10 (×2): 325 mg via ORAL
  Filled 2019-04-10 (×2): qty 1

## 2019-04-10 NOTE — Progress Notes (Signed)
Reviewed D/C instructions with pt and family. Pt verbalized understanding of teaching. Discharged to home via W/C. Pt to schedule f/u appt.  

## 2019-04-10 NOTE — Discharge Summary (Signed)
OB Discharge Summary     Patient Name: Breanna Lamb DOB: January 29, 1990 MRN: [id number]  Date of admission: 04/07/2019 Delivering MD: [name]  Date of Delivery: 04/08/2019  Date of discharge: 04/10/2019  Admitting diagnosis: Patient desires vaginal birth after cesarean section (VBAC) [O34.219] Malpresentation before onset of labor, fetus 1 [O32.9XX1] Intrauterine pregnancy: [redacted]w[redacted]d     Secondary diagnosis: None     Discharge diagnosis: Term Pregnancy Delivered                                                                                                Post partum procedures: postpartum tubal ligation  Augmentation: AROM, Pitocin and Foley Balloon  Complications: [id number]  Hospital course:  Induction of Labor With Cesarean Section  29 y.o. yo G3P3003 at [redacted]w[redacted]d was admitted to the hospital 04/07/2019 for induction of labor. Patient progressed to 7 cm. The patient went for cesarean section due to Non-Reassuring FHR, and delivered a Viable infant,04/08/2019  Membrane Rupture Time/Date: 11:22 PM ,04/07/2019   Details of operation can be found in separate operative Note.  Patient had an uncomplicated postpartum course. She is ambulating, tolerating a regular diet, passing flatus, and urinating well.  Patient is discharged home in stable condition on 04/10/19.                                    Physical exam  Vitals:   04/09/19 2332 04/10/19 0451 04/10/19 0746 04/10/19 1534  BP: 116/63 113/65 134/64 133/64  Pulse: 96 87 87 83  Resp: 18 16 18 18   Temp: 98.2 F (36.8 C) 98 F (36.7 C) 98.1 F (36.7 C) 97.8 F (36.6 C)  TempSrc: Oral  Oral Oral  SpO2: 100% 97% 99% 100%  Weight:      Height:       General: alert, cooperative and no distress Lochia: appropriate Uterine Fundus: firm Incision: Healing well with no significant drainage, Dressing is clean, dry, and intact, On Q pump intact DVT Evaluation: No evidence of DVT seen on physical exam.  Labs: Lab Results   Component Value Date   WBC 8.7 04/10/2019   HGB 8.4 (L) 04/10/2019   HCT 25.3 (L) 04/10/2019   MCV 87.5 04/10/2019   PLT 169 04/10/2019    Discharge instruction: per After Visit Summary.  Medications:  Allergies as of 04/10/2019      Reactions   Nickel Rash      Medication List    STOP taking these medications   valACYclovir 500 MG tablet Commonly known as: VALTREX     TAKE these medications   acetaminophen 325 MG tablet Commonly known as: TYLENOL Take 650 mg by mouth every 6 (six) hours as needed for moderate pain.   ferrous sulfate 325 (65 FE) MG tablet Take 1 tablet (325 mg total) by mouth daily with breakfast.   ibuprofen 800 MG tablet Commonly known as: ADVIL Take 1 tablet (800 mg total) by mouth every 8 (eight) hours as needed for moderate pain.   multivitamin-prenatal 27-0.8 MG Tabs tablet  Take 1 tablet by mouth daily at 12 noon.   oxyCODONE 5 MG immediate release tablet Commonly known as: Roxicodone Take 1 tablet (5 mg total) by mouth every 6 (six) hours as needed for up to 5 days for severe pain.   polyethylene glycol 17 g packet Commonly known as: MIRALAX / GLYCOLAX Take 17 g by mouth daily. Start taking on: April 11, 2019            Discharge Care Instructions  (From admission, onward)         Start     Ordered   04/10/19 0000  Discharge wound care:    Comments: Keep incision dry, clean.   04/10/19 1700          Diet: routine diet  Activity: Advance as tolerated. Pelvic rest for 6 weeks.   Outpatient follow up: Follow-up Information    [name], MD. Schedule an appointment as soon as possible for a visit in 1 week(s).   Specialty: Obstetrics and Gynecology Why: incision check Contact information: [address] [place] [address] [phone]             Postpartum contraception: Tubal Ligation Rhogam Given postpartum: NA Rubella vaccine given postpartum: Immune Varicella vaccine given postpartum:  Immune TDaP given antepartum or postpartum: antepartum  Newborn Data: Live born female  Birth Weight: 8 lb 2.2 oz (3690 g) APGAR: 26, 9  Newborn Delivery   Birth date/time: 04/08/2019 07:12:00 Delivery type: C-Section, Low Transverse Trial of labor: Yes C-section categorization: Repeat       Baby Feeding: Formula and Breast  Disposition:home with mother  SIGNED:  Rod Can, CNM 04/10/2019 5:02 PM

## 2019-04-10 NOTE — Progress Notes (Signed)
Obstetric Postpartum/PostOperative Daily Progress Note Subjective:  29 y.o. G3P3003 post-operative day # 2 status post repeat cesarean delivery with tubal ligation.  She is ambulating, is tolerating po, is voiding spontaneously.  Her pain is well controlled on PO pain medications and On Q pump. She has complaint of gas and constipation. Her lochia is less than menses. Baby has not latched well and she continues to pump while feeding formula. She admits some dizziness while up ambulating overnight. She denies current dizziness.    Medications SCHEDULED MEDICATIONS  . enoxaparin (LOVENOX) injection  40 mg Subcutaneous Q24H  . ferrous sulfate  325 mg Oral BID WC  . ibuprofen  800 mg Oral Q8H  . polyethylene glycol  17 g Oral Daily  . prenatal multivitamin  1 tablet Oral Q1200  . scopolamine  1 patch Transdermal Once  . senna-docusate  2 tablet Oral Q24H  . simethicone  80 mg Oral TID PC  . simethicone  80 mg Oral Q24H    MEDICATION INFUSIONS  . bupivacaine 0.25 % ON-Q pump DUAL CATH 400 mL    . lactated ringers    . naLOXone (NARCAN) adult infusion for PRURITIS      PRN MEDICATIONS  coconut oil, witch hazel-glycerin **AND** dibucaine, diphenhydrAMINE **OR** diphenhydrAMINE, diphenhydrAMINE, menthol-cetylpyridinium, nalbuphine **OR** nalbuphine, nalbuphine **OR** nalbuphine, naloxone **AND** sodium chloride flush, naLOXone (NARCAN) adult infusion for PRURITIS, ondansetron (ZOFRAN) IV, oxyCODONE-acetaminophen, simethicone, zolpidem    Objective:   Vitals:   04/09/19 1551 04/09/19 2332 04/10/19 0451 04/10/19 0746  BP: 118/69 116/63 113/65 134/64  Pulse: 86 96 87 87  Resp: 18 18 16 18   Temp: 98.3 F (36.8 C) 98.2 F (36.8 C) 98 F (36.7 C) 98.1 F (36.7 C)  TempSrc: Oral Oral  Oral  SpO2: 98% 100% 97% 99%  Weight:      Height:        Current Vital Signs 24h Vital Sign Ranges  T 98.1 F (36.7 C) Temp  Avg: 98.2 F (36.8 C)  Min: 98 F (36.7 C)  Max: 98.3 F (36.8 C)  BP 134/64  BP  Min: 113/65  Max: 134/64  HR 87 Pulse  Avg: 89  Min: 86  Max: 96  RR 18 Resp  Avg: 17.5  Min: 16  Max: 18  SaO2 99 % Room Air SpO2  Avg: 98.5 %  Min: 97 %  Max: 100 %       24 Hour I/O Current Shift I/O  Time Ins Outs No intake/output data recorded. No intake/output data recorded.  General: NAD Pulmonary: no increased work of breathing Abdomen: non-distended, non-tender, fundus firm at level of umbilicus Inc: Clean/dry/intact, On Q pump intact Extremities: no edema, no erythema, no tenderness  Labs:  Recent Labs  Lab 04/08/19 0907 04/09/19 0628 04/10/19 0535  WBC 18.3* 10.6* 8.7  HGB 11.1* 8.5* 8.4*  HCT 32.9* 26.5* 25.3*  PLT 196 165 169     Assessment:   29 y.o. G3P3003 postoperative day # 2 status post repeat cesarean section with tubal ligation  Plan:  1) Acute blood loss anemia - hemodynamically stable and asymptomatic currently - po ferrous sulfate, consider iron infusion prior to discharge if patient is able to have bowel movement first, continue to observe for s/s of anemia.  2) A POS / Rubella 5.49 (08/05 1047)/ Varicella Immune  3) TDAP status: Up to date  4) Formula/Breast/Contraception = bilateral tubal ligation   5) Constipation: Miralax q day  6) Disposition: continue current care with  possible discharge later today   Rod Can, CNM 04/10/2019 11:46 AM

## 2019-04-11 ENCOUNTER — Other Ambulatory Visit: Payer: Self-pay | Admitting: Advanced Practice Midwife

## 2019-04-11 DIAGNOSIS — Z299 Encounter for prophylactic measures, unspecified: Secondary | ICD-10-CM

## 2019-04-11 MED ORDER — ENOXAPARIN SODIUM 40 MG/0.4ML ~~LOC~~ SOLN: 40.0000 mg | SUBCUTANEOUS | 0 refills | Status: DC

## 2019-04-11 NOTE — Progress Notes (Unsigned)
Rx Lovenox sent to [place]. Resent to [facility] in [place].

## 2019-04-12 ENCOUNTER — Telehealth: Payer: Self-pay

## 2019-04-15 ENCOUNTER — Telehealth: Payer: Self-pay

## 2019-04-15 NOTE — Telephone Encounter (Signed)
Mikle Bosworth w/[employer] calling to confirm delivery information for this patient. Please date/time of delivery ICD-10 code next apt date and return to work date. Cb#[phone] ext [phone]

## 2019-04-16 ENCOUNTER — Other Ambulatory Visit: Payer: Self-pay | Admitting: Obstetrics & Gynecology

## 2019-04-16 MED ORDER — OXYCODONE-ACETAMINOPHEN 5-325 MG PO TABS: 1.0000 | ORAL_TABLET | ORAL | 0 refills | Status: DC | PRN

## 2019-04-16 NOTE — Telephone Encounter (Signed)
Please advise [employer] is out of the office this week.

## 2019-04-19 ENCOUNTER — Ambulatory Visit (INDEPENDENT_AMBULATORY_CARE_PROVIDER_SITE_OTHER): Payer: No Typology Code available for payment source | Admitting: Obstetrics and Gynecology

## 2019-04-19 ENCOUNTER — Encounter: Payer: Self-pay | Admitting: Obstetrics and Gynecology

## 2019-04-19 ENCOUNTER — Other Ambulatory Visit: Payer: Self-pay

## 2019-04-19 VITALS — BP 120/80 | Ht 61.0 in | Wt 231.0 lb

## 2019-04-19 DIAGNOSIS — Z4889 Encounter for other specified surgical aftercare: Secondary | ICD-10-CM

## 2019-04-19 NOTE — Progress Notes (Signed)
Postoperative Follow-up Patient presents post op from RLTCS & BTL 2weeks ago for failed TOLAC secondary to fetal intolerance to labor.  Subjective: Patient reports marked improvement in her preop symptoms. Eating a regular diet without difficulty. Pain is controlled with current analgesics. Medications being used: prescription NSAID's including ibuprofen (Motrin) and narcotic analgesics including oxycodone/acetaminophen (Percocet, Tylox).  Activity: normal activities of daily living.  Objective: Blood pressure 120/80, height 5\' 1"  (1.549 m), weight 231 lb (104.8 kg), last menstrual period 06/26/2018, unknown if currently breastfeeding.  General: NAD Pulmonary: no increased work of breathing Abdomen: soft, non-tender, non-distended, incision(s) D/C/I Extremities: no edema Neurologic: normal gait   Admission on 04/07/2019, Discharged on 04/10/2019  Component Date Value Ref Range Status  . WBC 04/07/2019 10.6* 4.0 - 10.5 K/uL Final  . RBC 04/07/2019 4.35  3.87 - 5.11 MIL/uL Final  . Hemoglobin 04/07/2019 12.2  12.0 - 15.0 g/dL Final  . HCT 04/07/2019 37.0  36.0 - 46.0 % Final  . MCV 04/07/2019 85.1  80.0 - 100.0 fL Final  . MCH 04/07/2019 28.0  26.0 - 34.0 pg Final  . MCHC 04/07/2019 33.0  30.0 - 36.0 g/dL Final  . RDW 04/07/2019 14.0  11.5 - 15.5 % Final  . Platelets 04/07/2019 224  150 - 400 K/uL Final  . nRBC 04/07/2019 0.0  0.0 - 0.2 % Final   Performed at [facility], [address], [place], [place] [address]  . ABO/RH(D) 04/07/2019 A POS   Final  . Antibody Screen 04/07/2019 NEG   Final  . Sample Expiration 04/07/2019    Final                   Value:04/10/2019,2359 Performed at [facility], [address], [place], [place] [address]   . RPR Ser Ql 04/07/2019 NON REACTIVE  NON REACTIVE Final   Performed at [facility], [place] [address], [place], [place] [address]  . WBC 04/08/2019 18.3* 4.0 - 10.5 K/uL Final  . RBC 04/08/2019 3.85* 3.87 -  5.11 MIL/uL Final  . Hemoglobin 04/08/2019 11.1* 12.0 - 15.0 g/dL Final  . HCT 04/08/2019 32.9* 36.0 - 46.0 % Final  . MCV 04/08/2019 85.5  80.0 - 100.0 fL Final  . MCH 04/08/2019 28.8  26.0 - 34.0 pg Final  . MCHC 04/08/2019 33.7  30.0 - 36.0 g/dL Final  . RDW 04/08/2019 14.1  11.5 - 15.5 % Final  . Platelets 04/08/2019 196  150 - 400 K/uL Final  . nRBC 04/08/2019 0.0  0.0 - 0.2 % Final   Performed at [facility], [address] [place], [place] [address]  . RPR Ser Ql 04/08/2019 NON REACTIVE  NON REACTIVE Final   Performed at [facility], [place] [address], [place], [place] [address]  . SURGICAL PATHOLOGY 04/08/2019    Final-Edited                   Value:SURGICAL PATHOLOGY CASE: [id number] PATIENT: [facility] Surgical Pathology Report     Specimen Submitted: A. Fallopian tube segment, left B. Fallopian tube segment, right  Clinical History: Fetal intolerance to labor, previous c/s.    DIAGNOSIS: A. FALLOPIAN TUBE SEGMENT, LEFT; STERILIZATION: - FALLOPIAN TUBE WITH NO SIGNIFICANT HISTOPATHOLOGIC CHANGE, SEEN ON FULL CROSS SECTION X2.  B. FALLOPIAN TUBE SEGMENT, RIGHT; STERILIZATION: - FALLOPIAN TUBE WITH NO SIGNIFICANT HISTOPATHOLOGIC CHANGE, SEEN ON FULL CROSS SECTION X2.  GROSS DESCRIPTION: A. Labeled: Left tube segment  Received: Formalin Tissue fragment(s): 1 Size: 1.3 cm in length by 0.6 cm in diameter Description: Received is an irregular, tubular shaped fragment of tan-pink soft tissue (grossly consistent with portion of fallopian tube).  Fimbria are absent.  No abnormalities are grossly identified.  Representative cross sections are submitted in cassette 1.  B. Labeled: Right tube segment                          Received: Formalin Tissue fragment(s): 1 Size: 1.5 cm in length by 0.7 cm in diameter Description: Received is an irregular, tubular shaped fragment of tan-pink soft tissue (grossly consistent with portion of fallopian  tube).  Fimbria are absent.  No abnormalities are grossly identified.  Representative cross sections are submitted in cassette 1.   Final Diagnosis performed by Allena Napoleon, MD.   Electronically signed 04/09/2019 9:52:23AM The electronic signature indicates that the named Attending Pathologist has evaluated the specimen Technical component performed at [facility], [address], [place], [place] [address] Lab: [phone] Dir: Rush Farmer, MD, MMM  Professional component performed at [facility], [facility], [place], [place], [place] [address] Lab: [phone] Dir: Dellia Nims. Rubinas, MD   . WBC 04/09/2019 10.6* 4.0 - 10.5 K/uL Final  . RBC 04/09/2019 3.00* 3.87 - 5.11 MIL/uL Final  . Hemoglobin 04/09/2019 8.5* 12.0 - 15.0 g/dL Final  . HCT 04/09/2019 26.5* 36.0 - 46.0 % Final  . MCV 04/09/2019 88.3  80.0 - 100.0 fL Final  . MCH 04/09/2019 28.3  26.0 - 34.0 pg Final  . MCHC 04/09/2019 32.1  30.0 - 36.0 g/dL Final  . RDW 04/09/2019 14.4  11.5 - 15.5 % Final  . Platelets 04/09/2019 165  150 - 400 K/uL Final  . nRBC 04/09/2019 0.0  0.0 - 0.2 % Final   Performed at [facility], [address], [place], [place] [address]  . WBC 04/10/2019 8.7  4.0 - 10.5 K/uL Final  . RBC 04/10/2019 2.89* 3.87 - 5.11 MIL/uL Final  . Hemoglobin 04/10/2019 8.4* 12.0 - 15.0 g/dL Final  . HCT 04/10/2019 25.3* 36.0 - 46.0 % Final  . MCV 04/10/2019 87.5  80.0 - 100.0 fL Final  . MCH 04/10/2019 29.1  26.0 - 34.0 pg Final  . MCHC 04/10/2019 33.2  30.0 - 36.0 g/dL Final  . RDW 04/10/2019 14.6  11.5 - 15.5 % Final  . Platelets 04/10/2019 169  150 - 400 K/uL Final  . nRBC 04/10/2019 0.0  0.0 - 0.2 % Final   Performed at [facility], [place] [place],  [address]    Assessment: 29 y.o. s/p RLTCS & BTL stable  Plan: Patient has done well after surgery with no apparent complications.  I have discussed the post-operative course to date, and the expected  progress moving forward.  The patient understands what complications to be concerned about.  I will see the patient in routine follow up, or sooner if needed.    Activity plan: No heavy lifting.   [name], MD, [facility], [facility] 04/19/2019, 2:32 PM

## 2019-04-23 NOTE — Telephone Encounter (Signed)
Spoke w/Breanna Lamb. Delivery date and next apt date given. Return to work date advised as 05/13/2019 (6 wks post delivery). However, Advised pt's 6 wk ppc isn't until 05/31/2019.

## 2019-05-03 ENCOUNTER — Other Ambulatory Visit: Payer: Self-pay | Admitting: Advanced Practice Midwife

## 2019-05-03 NOTE — Telephone Encounter (Signed)
Advise

## 2019-05-17 ENCOUNTER — Other Ambulatory Visit: Payer: Self-pay | Admitting: Advanced Practice Midwife

## 2019-05-31 ENCOUNTER — Other Ambulatory Visit: Payer: Self-pay

## 2019-05-31 ENCOUNTER — Encounter: Payer: Self-pay | Admitting: Obstetrics and Gynecology

## 2019-05-31 ENCOUNTER — Other Ambulatory Visit (HOSPITAL_COMMUNITY)
Admission: RE | Admit: 2019-05-31 | Discharge: 2019-05-31 | Disposition: A | Discharge status: Home / Self Care | Payer: No Typology Code available for payment source | Source: Ambulatory Visit | Attending: Obstetrics and Gynecology | Admitting: Obstetrics and Gynecology

## 2019-05-31 ENCOUNTER — Ambulatory Visit (INDEPENDENT_AMBULATORY_CARE_PROVIDER_SITE_OTHER): Payer: No Typology Code available for payment source | Admitting: Obstetrics and Gynecology

## 2019-05-31 DIAGNOSIS — Z124 Encounter for screening for malignant neoplasm of cervix: Secondary | ICD-10-CM

## 2019-05-31 MED ORDER — NYSTATIN 100000 UNIT/GM EX CREA: 1.0000 "application " | TOPICAL_CREAM | Freq: Two times a day (BID) | CUTANEOUS | 1 refills | Status: DC

## 2019-05-31 NOTE — Progress Notes (Signed)
Postpartum Visit  Chief Complaint:  Chief Complaint  Patient presents with  . Postpartum Care    C/S 4/1    History of Present Illness: Patient is a 29 y.o. [id number] presents for postpartum visit.  Date of delivery: 04/08/2019 Cesarean Section: Fetal intolerance to labor Pregnancy or labor problems:  no Any problems since the delivery:  no  Newborn Details:  SINGLETON :  1. BabyGender female Marcello Moores). Birth weight: 8lbs 2.2oz Maternal Details:  Breast or formula feeding: plans to bottle feed Contraception after delivery: Yes  Any bowel or bladder issues: No  Post partum depression/anxiety noted:  no Edinburgh Post-Partum Depression Score:9 Date of last PAP: 04/12/2016  no abnormalities   Review of Systems: Review of Systems  Constitutional: Negative.   Gastrointestinal: Negative.   Genitourinary: Negative.   Psychiatric/Behavioral: Negative.     The following portions of the patient's history were reviewed and updated as appropriate: allergies, current medications, past family history, past medical history, past social history, past surgical history and problem list.  Past Medical History:  Past Medical History:  Diagnosis Date  . Allergic rhinitis   . Asthma    childhood  . Benign positional vertigo   . Bunion   . Contraceptive management   . Depression   . Genital herpes   . Henoch-Schonlein purpura ([place])   . Low-lying placenta 11/19/2018   Resolved 23 week anatomy follow up  . Lymphadenopathy   . Neck pain   . Obesity   . Ovarian cyst, right   . Poor concentration   . Premenstrual dysphoric syndrome   . Premenstrual dysphoric syndrome   . Screening for venereal disease   . Skin lesion     Past Surgical History:  Past Surgical History:  Procedure Laterality Date  . BUNIONECTOMY  2010 and 2011  . CESAREAN SECTION    . CESAREAN SECTION WITH BILATERAL TUBAL LIGATION  04/08/2019   Procedure: CESAREAN SECTION WITH BILATERAL TUBAL LIGATION;  Surgeon:  [name], MD;  Location: [facility] ORS;  Service: Obstetrics;;  . mirena IUD  01/2014  . OVARIAN CYST REMOVAL  August 2014  . TUBAL LIGATION  04/2019  . WISDOM TOOTH EXTRACTION  2014    Family History:  Family History  Problem Relation Age of Onset  . Hypertension Mother   . Mental illness Mother        Depression, Anxiety  . Hypertension Father   . Hypertension Brother   . Hypertension Maternal Grandmother   . Diabetes Maternal Grandmother   . Hyperlipidemia Maternal Grandmother   . Heart disease Maternal Grandmother   . COPD Maternal Grandmother   . Kidney disease Maternal Grandmother   . Mental illness Maternal Grandmother   . Hypertension Sister   . Mental illness Sister   . Heart disease Maternal Grandfather   . Heart attack Maternal Grandfather   . Cancer Paternal Grandmother     Social History:  Social History   Socioeconomic History  . Marital status: Married    Spouse name: Not on file  . Number of children: Not on file  . Years of education: Not on file  . Highest education level: Not on file  Occupational History  . Not on file  Tobacco Use  . Smoking status: Never Smoker  . Smokeless tobacco: Never Used  Substance and Sexual Activity  . Alcohol use: No  . Drug use: No  . Sexual activity: Yes    Birth control/protection: Surgical    Comment:  Tubal Ligation  Other Topics Concern  . Not on file  Social History Narrative  . Not on file   Social Determinants of Health   Financial Resource Strain:   . Difficulty of Paying Living Expenses:   Food Insecurity:   . Worried About Charity fundraiser in the Last Year:   . Arboriculturist in the Last Year:   Transportation Needs:   . Film/video editor (Medical):   Marland Kitchen Lack of Transportation (Non-Medical):   Physical Activity:   . Days of Exercise per Week:   . Minutes of Exercise per Session:   Stress:   . Feeling of Stress :   Social Connections:   . Frequency of Communication with Friends  and Family:   . Frequency of Social Gatherings with Friends and Family:   . Attends Religious Services:   . Active Member of Clubs or Organizations:   . Attends Archivist Meetings:   Marland Kitchen Marital Status:   Intimate Partner Violence:   . Fear of Current or Ex-Partner:   . Emotionally Abused:   Marland Kitchen Physically Abused:   . Sexually Abused:     Allergies:  Allergies  Allergen Reactions  . Nickel Rash    Medications: Prior to Admission medications   Medication Sig Start Date End Date Taking? Authorizing Provider  acetaminophen (TYLENOL) 325 MG tablet Take 650 mg by mouth every 6 (six) hours as needed for moderate pain.    [provider]  enoxaparin (LOVENOX) 40 MG/0.4ML injection Inject 0.4 mLs (40 mg total) into the skin daily for 28 days. 04/11/19 05/09/19  Rod Can, CNM  ferrous sulfate 325 (65 FE) MG tablet TAKE 1 TABLET BY MOUTH DAILY WITH BREAKFAST 05/03/19   Rod Can, CNM  ibuprofen (ADVIL) 800 MG tablet Take 1 tablet (800 mg total) by mouth every 8 (eight) hours as needed for moderate pain. 04/10/19   Rod Can, CNM  oxyCODONE-acetaminophen (PERCOCET/ROXICET) 5-325 MG tablet Take 1 tablet by mouth every 4 (four) hours as needed for severe pain. 04/16/19   Gae Dry, MD  Prenatal Vit-Fe Fumarate-FA (MULTIVITAMIN-PRENATAL) 27-0.8 MG TABS tablet Take 1 tablet by mouth daily at 12 noon.    [provider]    Physical Exam Blood pressure 124/76, weight 230 lb (104.3 kg), last menstrual period 06/26/2018, not currently breastfeeding.  General: NAD HEENT: normocephalic, anicteric Pulmonary: No increased work of breathing Abdomen: NABS, soft, non-tender, non-distended.  Umbilicus without lesions.  No hepatomegaly, splenomegaly or masses palpable. No evidence of hernia. Incision D/C/I.  Small amount of erythema consistent with candida tope right aspect of incision Genitourinary:  External: Normal external female genitalia.  Normal urethral  meatus, normal  Bartholin's and Skene's glands.    Vagina: Normal vaginal mucosa, no evidence of prolapse.    Cervix: Grossly normal in appearance, no bleeding  Uterus: Non-enlarged, mobile, normal contour.  No CMT  Adnexa: ovaries non-enlarged, no adnexal masses  Rectal: deferred Extremities: no edema, erythema, or tenderness Neurologic: Grossly intact Psychiatric: mood appropriate, affect full  Edinburgh Postnatal Depression Scale - 05/31/19 1556      Edinburgh Postnatal Depression Scale:  In the Past 7 Days   I have been able to laugh and see the funny side of things.  0    I have looked forward with enjoyment to things.  1    I have blamed myself unnecessarily when things went wrong.  2    I have been anxious or worried for no  good reason.  2    I have felt scared or panicky for no good reason.  2    Things have been getting on top of me.  1    I have been so unhappy that I have had difficulty sleeping.  0    I have felt sad or miserable.  1    I have been so unhappy that I have been crying.  0    The thought of harming myself has occurred to me.  0    Edinburgh Postnatal Depression Scale Total  9       Edinburgh Postnatal Depression Scale - 05/31/19 1556      Edinburgh Postnatal Depression Scale:  In the Past 7 Days   I have been able to laugh and see the funny side of things.  0    I have looked forward with enjoyment to things.  1    I have blamed myself unnecessarily when things went wrong.  2    I have been anxious or worried for no good reason.  2    I have felt scared or panicky for no good reason.  2    Things have been getting on top of me.  1    I have been so unhappy that I have had difficulty sleeping.  0    I have felt sad or miserable.  1    I have been so unhappy that I have been crying.  0    The thought of harming myself has occurred to me.  0    Edinburgh Postnatal Depression Scale Total  9        Assessment: 29 y.o. [phone] presenting for 6 week  postpartum visit  Plan: Problem List Items Addressed This Visit    None    Visit Diagnoses    6 weeks postpartum follow-up    -  Primary   Screening for malignant neoplasm of cervix       Relevant Orders   Cytology - PAP      1) Contraception - Education given regarding options for contraception, as well as compatibility with breast feeding if applicable.  Patient s/pt BTL  2)  Pap - ASCCP guidelines and rational discussed.  ASCCP guidelines and rational discussed.  Patient opts for every 3 years screening interval  3) Patient underwent screening for postpartum depression with no signs of depression  4) Candida corporis and vaginal candida - Rx nystatin cream - RX diflucan  4) Return in about 1 year (around 05/30/2020) for annual.   [name], MD, [facility] 05/31/2019, 4:15 PM

## 2019-06-02 LAB — CYTOLOGY - PAP: Diagnosis: NEGATIVE

## 2019-06-04 ENCOUNTER — Other Ambulatory Visit: Payer: Self-pay | Admitting: Certified Nurse Midwife

## 2019-06-04 NOTE — Telephone Encounter (Signed)
You had just seen this patient for her pp visit  and prescribed antifungals. Did you want to refill this? [employer]

## 2019-06-23 NOTE — Telephone Encounter (Signed)
error 

## 2019-08-05 ENCOUNTER — Encounter: Payer: Self-pay | Admitting: Nurse Practitioner

## 2019-08-05 ENCOUNTER — Ambulatory Visit (INDEPENDENT_AMBULATORY_CARE_PROVIDER_SITE_OTHER): Payer: No Typology Code available for payment source | Admitting: Nurse Practitioner

## 2019-08-05 VITALS — Wt 232.0 lb

## 2019-08-05 DIAGNOSIS — O906 Postpartum mood disturbance: Secondary | ICD-10-CM | POA: Diagnosis not present

## 2019-08-05 DIAGNOSIS — G44229 Chronic tension-type headache, not intractable: Secondary | ICD-10-CM

## 2019-08-05 MED ORDER — FLUOXETINE HCL 20 MG PO CAPS: 20.0000 mg | ORAL_CAPSULE | Freq: Every day | ORAL | 0 refills | Status: DC

## 2019-08-05 NOTE — Patient Instructions (Addendum)
Nice meeting you today, Breanna Lamb!  Your Prozac should be at your pharmacy - start by taking 20 mg daily for 1 week and then increase to 40 mg for 1 week and we will follow up after that.    If your headaches worsen, be sure to follow up with [place] sooner.  Take care!  Managing Stress, Adult Feeling a certain amount of stress is normal. Stress helps our body and mind get ready to deal with the demands of life. Stress hormones can motivate you to do well at work and meet your responsibilities. However severe or long-lasting (chronic) stress can affect your mental and physical health. Chronic stress puts you at higher risk for anxiety, depression, and other health problems like digestive problems, muscle aches, heart disease, high blood pressure, and stroke. What are the causes? Common causes of stress include:  Demands from work, such as deadlines, feeling overworked, or having long hours.  Pressures at home, such as money issues, disagreements with a spouse, or parenting issues.  Pressures from major life changes, such as divorce, moving, loss of a loved one, or chronic illness. You may be at higher risk for stress-related problems if you do not get enough sleep, are in poor health, do not have emotional support, or have a mental health disorder like anxiety or depression. How to recognize stress Stress can make you:  Have trouble sleeping.  Feel sad, anxious, irritable, or overwhelmed.  Lose your appetite.  Overeat or want to eat unhealthy foods.  Want to use drugs or alcohol. Stress can also cause physical symptoms, such as:  Sore, tense muscles, especially in the shoulders and neck.  Headaches.  Trouble breathing.  A faster heart rate.  Stomach pain, nausea, or vomiting.  Diarrhea or constipation.  Trouble concentrating. Follow these instructions at home: Lifestyle  Identify the source of your stress and your reaction to it. See a therapist who can help you change your  reactions.  When there are stressful events: ? Talk about it with family, friends, or co-workers. ? Try to think realistically about stressful events and not ignore them or overreact. ? Try to find the positives in a stressful situation and not focus on the negatives. ? Cut back on responsibilities at work and home, if possible. Ask for help from friends or family members if you need it.  Find ways to cope with stress, such as: ? Meditation. ? Deep breathing. ? Yoga or tai chi. ? Progressive muscle relaxation. ? Doing art, playing music, or reading. ? Making time for fun activities. ? Spending time with family and friends.  Get support from family, friends, or spiritual resources. Eating and drinking  Eat a healthy diet. This includes: ? Eating foods that are high in fiber, such as beans, whole grains, and fresh fruits and vegetables. ? Limiting foods that are high in fat and processed sugars, such as fried and sweet foods.  Do not skip meals or overeat.  Drink enough fluid to keep your urine pale yellow. Alcohol use  Do not drink alcohol if: ? Your health care provider tells you not to drink. ? You are pregnant, may be pregnant, or are planning to become pregnant.  Drinking alcohol is a way some people try to ease their stress. This can be dangerous, so if you drink alcohol: ? Limit how much you use to:  0-1 drink a day for women.  0-2 drinks a day for men. ? Be aware of how much alcohol is in  your drink. In the [place] one drink equals one 12 oz bottle of beer (355 mL), one 5 oz glass of wine (148 mL), or one 1 oz glass of hard liquor (44 mL). Activity   Include 30 minutes of exercise in your daily schedule. Exercise is a good stress reducer.  Include time in your day for an activity that you find relaxing. Try taking a walk, going on a bike ride, reading a book, or listening to music.  Schedule your time in a way that lowers stress, and keep a consistent schedule.  Prioritize what is most important to get done. General instructions  Get enough sleep. Try to go to sleep and get up at about the same time every day.  Take over-the-counter and prescription medicines only as told by your health care provider.  Do not use any products that contain nicotine or tobacco, such as cigarettes, e-cigarettes, and chewing tobacco. If you need help quitting, ask your health care provider.  Do not use drugs or smoke to cope with stress.  Keep all follow-up visits as told by your health care provider. This is important. Where to find support  Talk with your health care provider about stress management or finding a support group.  Find a therapist to work with you on your stress management techniques. Contact a health care provider if:  Your stress symptoms get worse.  You are unable to manage your stress at home.  You are struggling to stop using drugs or alcohol. Get help right away if:  You may be a danger to yourself or others.  You have any thoughts of death or suicide. If you ever feel like you may hurt yourself or others, or have thoughts about taking your own life, get help right away. You can go to your nearest emergency department or call:  Your local emergency services (911 in the [place]  A suicide crisis helpline, such as the [place] at 519-472-1847. This is open 24 hours a day. Summary  Feeling a certain amount of stress is normal, but severe or long-lasting (chronic) stress can affect your mental and physical health.  Chronic stress can put you at higher risk for anxiety, depression, and other health problems like digestive problems, muscle aches, heart disease, high blood pressure, and stroke.  You may be at higher risk for stress-related problems if you do not get enough sleep, are in poor health, lack emotional support, or have a mental health disorder like anxiety or depression.  Identify the source of  your stress and your reaction to it. Try talking about stressful events with family, friends, or co-workers, finding a coping method, or getting support from spiritual resources.  If you need more help, talk with your health care provider about finding a support group or a mental health therapist. This information is not intended to replace advice given to you by your health care provider. Make sure you discuss any questions you have with your health care provider. Document Revised: 07/22/2018 Document Reviewed: 07/22/2018 Elsevier Patient Education  [place].

## 2019-08-05 NOTE — Assessment & Plan Note (Addendum)
Chronic, not doing well.  Seem to be worse since having COVID, but somewhat well-controlled with Tylenol - okay to continue for now.  Hesitant to restart previously prescribed Topamax at same time as Prozac due to potential for side effects.  Follow up in 2 weeks to re-assess symptoms and possibly restart Topamax.

## 2019-08-05 NOTE — Progress Notes (Addendum)
Wt (!) 232 lb (105.2 kg)   BMI 43.84 kg/m    Subjective:    Patient ID: Breanna Lamb, female    DOB: 03/28/1990, 29 y.o.   MRN: [id number]  HPI: Breanna Lamb is a 29 y.o. female presenting with anxiety and headache  Chief Complaint  Patient presents with  . Anxiety  . Headache   ANXIETY Patient reports a history of anxiety.  Recently gave birth and returned to work.  Reports at work, she is having trouble staying focused, becomes irritated easily, and has decreased interest in doing things she used to enjoy.  Has taken prozac and zoloft in the past, the prozac seemed to help more at higher doses and did not feel much benefit prozac.  She is not breastfeeding currently.  Also reports concerns about her weight - she is the heaviest she has ever been and is worried - reports eating all day and has a hard time feeling full. Duration:uncontrolled Anxious mood: yes  Excessive worrying: no Irritability: yes  Sweating: no Nausea: no Palpitations:no Hyperventilation: no Panic attacks: no Agoraphobia: no  Obscessions/compulsions: no Depressed mood: yes Depression screen [facility] 2/9 08/05/2019 06/17/2017 04/18/2017 01/31/2016  Decreased Interest 3 1 2 1   Down, Depressed, Hopeless 2 1 2 2   PHQ - 2 Score 5 2 4 3   Altered sleeping 2 1 1 3   Tired, decreased energy 2 2 2 2   Change in appetite 3 3 3 3   Feeling bad or failure about yourself  2 1 2 1   Trouble concentrating 2 1 3 2   Moving slowly or fidgety/restless 2 1 1  0  Suicidal thoughts 0 0 0 0  PHQ-9 Score 18 11 16 14   Difficult doing work/chores Extremely dIfficult Somewhat difficult - -   GAD 7 : Generalized Anxiety Score 08/05/2019  Nervous, Anxious, on Edge 3  Control/stop worrying 3  Worry too much - different things 3  Trouble relaxing 2  Restless 2  Easily annoyed or irritable 3  Afraid - awful might happen 3  Total GAD 7 Score 19  Anxiety Difficulty Extremely difficult   Anhedonia: yes Weight changes: yes Insomnia:  yes  Hypersomnia: no Fatigue/loss of energy: yes Feelings of worthlessness: no Feelings of guilt: yes Impaired concentration/indecisiveness: yes Suicidal ideations: no  Crying spells: no Recent Stressors/Life Changes: yes   Relationship problems: no   Family stress: yes     Financial stress: no    Job stress: yes    Recent death/loss: no  HEADACHE  Reports significant history of same - tried Topamax in the past and does not remember it helping much.  Complicated by having covid right after the birth of her baby.  Recently, has been taking Tylenol multiple times per day which has been helping. Duration: chronic Onset: gradual Severity: moderate Quality: aching Frequency: daily Location: head Headache duration: ongoing Radiation: no Time of day headache occurs: all day Alleviating factors: Tylenol Aggravating factors: nothing known Treatments attempted: Tylenol    Aura: no Nausea:  no Vomiting: no Photophobia:  no Phonophobia:  no Effect on social functioning:  yes Confusion:  no  Allergies  Allergen Reactions  . Nickel Rash   Outpatient Encounter Medications as of 08/05/2019  Medication Sig  . acetaminophen (TYLENOL) 325 MG tablet Take 650 mg by mouth every 6 (six) hours as needed for moderate pain.  Marland Kitchen ibuprofen (ADVIL) 800 MG tablet Take 1 tablet (800 mg total) by mouth every 8 (eight) hours as needed for moderate pain.  Marland Kitchen  FLUoxetine (PROZAC) 20 MG capsule Take 1 capsule (20 mg total) by mouth daily. Take one capsule (20 mg) for seven days, then increase to two capsules (40 mg) for seven days.  . [DISCONTINUED] enoxaparin (LOVENOX) 40 MG/0.4ML injection Inject 0.4 mLs (40 mg total) into the skin daily for 28 days.  . [DISCONTINUED] ferrous sulfate 325 (65 FE) MG tablet TAKE 1 TABLET BY MOUTH DAILY WITH BREAKFAST  . [DISCONTINUED] nystatin cream (MYCOSTATIN) Apply 1 application topically 2 (two) times daily.  . [DISCONTINUED] oxyCODONE-acetaminophen (PERCOCET/ROXICET)  5-325 MG tablet Take 1 tablet by mouth every 4 (four) hours as needed for severe pain.  . [DISCONTINUED] Prenatal Vit-Fe Fumarate-FA (MULTIVITAMIN-PRENATAL) 27-0.8 MG TABS tablet Take 1 tablet by mouth daily at 12 noon.   No facility-administered encounter medications on file as of 08/05/2019.   Patient Active Problem List   Diagnosis Date Noted  . Postpartum mood disturbance 08/05/2019  . Postpartum care following cesarean delivery 04/10/2019  . Encounter for care or examination of lactating mother 04/10/2019  . S/P tubal ligation 04/10/2019  . Pregnancy with abdominal pain of lower quadrant, antepartum 04/03/2019  . Vaginal discharge during pregnancy in third trimester   . Transverse fetal lie 04/01/2019  . SOB (shortness of breath) 03/23/2019  . Decreased fetal movement 03/18/2019  . Supervision of high-risk pregnancy 07/29/2018  . Maternal obesity, antepartum 07/29/2018  . History of cesarean section complicating pregnancy 30/16/0109  . History of gestational hypertension 07/29/2018  . Genital herpes simplex virus (HSV) infection in mother affecting pregnancy 07/29/2018  . Hypoglycemia 02/24/2018  . Chronic tension-type headache, not intractable 01/31/2016  . Premenstrual dysphoric syndrome    Past Medical History:  Diagnosis Date  . Allergic rhinitis   . Asthma    childhood  . Benign positional vertigo   . Bunion   . Contraceptive management   . Depression   . Genital herpes   . Henoch-Schonlein purpura ([place])   . Low-lying placenta 11/19/2018   Resolved 23 week anatomy follow up  . Lymphadenopathy   . Neck pain   . Obesity   . Ovarian cyst, right   . Poor concentration   . Premenstrual dysphoric syndrome   . Premenstrual dysphoric syndrome   . Screening for venereal disease   . Skin lesion    Relevant past medical, surgical, family and social history reviewed and updated as indicated. Interim medical history since our last visit reviewed.  Review of Systems    Constitutional: Positive for appetite change and fatigue. Negative for activity change, chills and fever.  Cardiovascular: Negative.   Gastrointestinal: Negative.   Neurological: Positive for headaches. Negative for dizziness, weakness and light-headedness.  Psychiatric/Behavioral: Positive for agitation, decreased concentration and sleep disturbance. Negative for confusion and suicidal ideas. The patient is nervous/anxious.    Per HPI unless specifically indicated above    Objective:    Wt (!) 232 lb (105.2 kg)   BMI 43.84 kg/m   Wt Readings from Last 3 Encounters:  08/05/19 (!) 232 lb (105.2 kg)  05/31/19 230 lb (104.3 kg)  04/19/19 231 lb (104.8 kg)    Physical Exam Vitals and nursing note reviewed.  Constitutional:      General: She is not in acute distress.    Appearance: Normal appearance. She is not toxic-appearing.  HENT:     Head: Normocephalic.  Cardiovascular:     Comments: Unable to assess heart sounds via virtual visit Pulmonary:     Effort: Pulmonary effort is normal. No respiratory distress.  Comments: Unable to assess lung sounds via virtual visit Abdominal:     Comments: Unable to assess bowel sounds via virtual visit  Skin:    Coloration: Skin is not jaundiced or pale.  Neurological:     General: No focal deficit present.     Mental Status: She is alert and oriented to person, place, and time.     Motor: No weakness.     Gait: Gait normal.  Psychiatric:        Mood and Affect: Mood normal.        Behavior: Behavior normal.        Thought Content: Thought content normal.        Judgment: Judgment normal.       Assessment & Plan:   Problem List Items Addressed This Visit      Nervous and Auditory   Chronic tension-type headache, not intractable - Primary    Chronic, not doing well.  Seem to be worse since having COVID, but somewhat well-controlled with Tylenol - okay to continue for now.  Hesitant to restart previously prescribed Topamax at  same time as Prozac due to potential for side effects.  Follow up in 2 weeks to re-assess symptoms and possibly restart Topamax.      Relevant Medications   FLUoxetine (PROZAC) 20 MG capsule     Other   Postpartum mood disturbance    Acute, ongoing.  With history in past post-partum periods.  PHQ-9 and GAD-7 both elevated today.  With good response in past and not current breastfeeding, start Prozac and slowly ramp up - 20 mg for seven days followed by 40 mg for seven days.  Follow up in 2 weeks or sooner if needed.           Follow up plan: Return in about 2 weeks (around 08/19/2019) for mood, headache f/u .   Due to the catastrophic nature of the COVID-19 pandemic, this visit was completed via audio and visual contact via Mychart due to the restrictions of the COVID-19 pandemic. All issues as above were discussed and addressed. Physical exam was done as above through visual confirmation on Mychart. If it was felt that the patient should be evaluated in the office, they were directed there. The patient verbally consented to this visit."} . Location of the patient: home . Location of the provider: work . Those involved with this call:  . Provider: Carnella Gu[place], DNP . CMA: Lesle Chris, [place] . Front Desk/Registration: Don Perking  . Time spent on call: 15 minutes on the phone discussing health concerns. 20 minutes total spent in review of patient's record and preparation of their chart.  I verified patient identity using two factors (patient name and date of birth). Patient consents verbally to being seen via telemedicine visit today.

## 2019-08-05 NOTE — Assessment & Plan Note (Signed)
Acute, ongoing.  With history in past post-partum periods.  PHQ-9 and GAD-7 both elevated today.  With good response in past and not current breastfeeding, start Prozac and slowly ramp up - 20 mg for seven days followed by 40 mg for seven days.  Follow up in 2 weeks or sooner if needed.

## 2019-08-19 ENCOUNTER — Encounter: Payer: Self-pay | Admitting: Nurse Practitioner

## 2019-08-19 ENCOUNTER — Other Ambulatory Visit: Payer: Self-pay | Admitting: Nurse Practitioner

## 2019-08-19 MED ORDER — FLUOXETINE HCL 40 MG PO CAPS: 40.0000 mg | ORAL_CAPSULE | Freq: Every day | ORAL | 0 refills | Status: DC

## 2019-09-03 ENCOUNTER — Encounter: Payer: Self-pay | Admitting: Family Medicine

## 2019-09-03 ENCOUNTER — Other Ambulatory Visit: Payer: Self-pay | Admitting: Family Medicine

## 2019-09-03 ENCOUNTER — Telehealth (INDEPENDENT_AMBULATORY_CARE_PROVIDER_SITE_OTHER): Payer: No Typology Code available for payment source | Admitting: Family Medicine

## 2019-09-03 DIAGNOSIS — O906 Postpartum mood disturbance: Secondary | ICD-10-CM | POA: Diagnosis not present

## 2019-09-03 MED ORDER — FLUOXETINE HCL 60 MG PO TABS: 60.0000 mg | ORAL_TABLET | Freq: Every day | ORAL | 1 refills | Status: DC

## 2019-09-03 NOTE — Assessment & Plan Note (Signed)
Still not great. Will increase to 60mg . Call with any concerns. Continue to monitor.

## 2019-09-03 NOTE — Progress Notes (Signed)
There were no vitals taken for this visit.   Subjective:    Patient ID: Breanna Lamb, female    DOB: 10/23/1990, 29 y.o.   MRN: [id number]  HPI: LASHANTI Lamb is a 29 y.o. female  Chief Complaint  Patient presents with  . Migraine  . Depression   DEPRESSION Mood status: better Satisfied with current treatment?: yes Symptom severity: mild  Duration of current treatment : chronic Side effects: no Medication compliance: excellent compliance Psychotherapy/counseling: no Previous psychiatric medications: prozac Depressed mood: yes Anxious mood: yes Anhedonia: no Significant weight loss or gain: no Insomnia: no  Fatigue: yes Feelings of worthlessness or guilt: no Impaired concentration/indecisiveness: no Suicidal ideations: no Hopelessness: no Crying spells: no Depression screen [facility] 2/9 09/03/2019 08/05/2019 06/17/2017 04/18/2017 01/31/2016  Decreased Interest 0 3 1 2 1   Down, Depressed, Hopeless 0 2 1 2 2   PHQ - 2 Score 0 5 2 4 3   Altered sleeping 0 2 1 1 3   Tired, decreased energy 1 2 2 2 2   Change in appetite 1 3 3 3 3   Feeling bad or failure about yourself  1 2 1 2 1   Trouble concentrating 1 2 1 3 2   Moving slowly or fidgety/restless 0 2 1 1  0  Suicidal thoughts 0 0 0 0 0  PHQ-9 Score 4 18 11 16 14   Difficult doing work/chores Somewhat difficult Extremely dIfficult Somewhat difficult - -   MIGRAINES- had covid after the baby was born and has been having a headache daily after the baby, but now much better since starting the prozac. Feeling back to herself.  Relevant past medical, surgical, family and social history reviewed and updated as indicated. Interim medical history since our last visit reviewed. Allergies and medications reviewed and updated.  Review of Systems  Constitutional: Negative.   Respiratory: Negative.   Cardiovascular: Negative.   Gastrointestinal: Negative.   Musculoskeletal: Negative.   Skin: Negative.   Psychiatric/Behavioral: Positive  for dysphoric mood. Negative for agitation, behavioral problems, confusion, decreased concentration, hallucinations, self-injury, sleep disturbance and suicidal ideas. The patient is not nervous/anxious and is not hyperactive.     Per HPI unless specifically indicated above     Objective:    There were no vitals taken for this visit.  Wt Readings from Last 3 Encounters:  08/05/19 (!) 232 lb (105.2 kg)  05/31/19 230 lb (104.3 kg)  04/19/19 231 lb (104.8 kg)    Physical Exam Vitals and nursing note reviewed.  Constitutional:      General: She is not in acute distress.    Appearance: Normal appearance. She is not ill-appearing, toxic-appearing or diaphoretic.  HENT:     Head: Normocephalic and atraumatic.     Right Ear: External ear normal.     Left Ear: External ear normal.     Nose: Nose normal.     Mouth/Throat:     Mouth: Mucous membranes are moist.     Pharynx: Oropharynx is clear.  Eyes:     General: No scleral icterus.       Right eye: No discharge.        Left eye: No discharge.     Conjunctiva/sclera: Conjunctivae normal.     Pupils: Pupils are equal, round, and reactive to light.  Pulmonary:     Effort: Pulmonary effort is normal. No respiratory distress.     Comments: Speaking in full sentences Musculoskeletal:        General: Normal range of motion.  Cervical back: Normal range of motion.  Skin:    Coloration: Skin is not jaundiced or pale.     Findings: No bruising, erythema, lesion or rash.  Neurological:     Mental Status: She is alert and oriented to person, place, and time. Mental status is at baseline.  Psychiatric:        Mood and Affect: Mood normal.        Behavior: Behavior normal.        Thought Content: Thought content normal.        Judgment: Judgment normal.     Results for orders placed or performed in visit on 05/31/19  Cytology - PAP  Result Value Ref Range   Adequacy      Satisfactory for evaluation; transformation zone component  PRESENT.   Diagnosis      - Negative for intraepithelial lesion or malignancy (NILM)      Assessment & Plan:   Problem List Items Addressed This Visit      Other   Postpartum mood disturbance    Still not great. Will increase to 60mg . Call with any concerns. Continue to monitor.           Follow up plan: Return in about 6 months (around 03/05/2020) for physical.   . This visit was completed via MyChart due to the restrictions of the COVID-19 pandemic. All issues as above were discussed and addressed. Physical exam was done as above through visual confirmation on MyChart. If it was felt that the patient should be evaluated in the office, they were directed there. The patient verbally consented to this visit. . Location of the patient: work . Location of the provider: work . Those involved with this call:  . Provider: Park Liter, DO . CMA: Lauretta Grill, RMA . Front Desk/Registration: Don Perking  . Time spent on call: 15 minutes with patient face to face via video conference. More than 50% of this time was spent in counseling and coordination of care. 23 minutes total spent in review of patient's record and preparation of their chart.

## 2019-12-20 ENCOUNTER — Encounter: Payer: Self-pay | Admitting: Family Medicine

## 2020-01-03 ENCOUNTER — Other Ambulatory Visit: Payer: Self-pay | Admitting: Family Medicine

## 2020-01-03 ENCOUNTER — Encounter: Payer: Self-pay | Admitting: Family Medicine

## 2020-01-03 ENCOUNTER — Telehealth (INDEPENDENT_AMBULATORY_CARE_PROVIDER_SITE_OTHER): Payer: No Typology Code available for payment source | Admitting: Family Medicine

## 2020-01-03 VITALS — Ht 61.0 in | Wt 239.4 lb

## 2020-01-03 DIAGNOSIS — F339 Major depressive disorder, recurrent, unspecified: Secondary | ICD-10-CM | POA: Diagnosis not present

## 2020-01-03 DIAGNOSIS — Z8639 Personal history of other endocrine, nutritional and metabolic disease: Secondary | ICD-10-CM | POA: Insufficient documentation

## 2020-01-03 MED ORDER — CONTRAVE 8-90 MG PO TB12: ORAL_TABLET | ORAL | 2 refills | Status: DC

## 2020-01-03 NOTE — Assessment & Plan Note (Signed)
Has failed topamax and phentermine in the past. Will start wellbutrin to help weight and mood. Recheck 1 month. Call with any concerns.

## 2020-01-03 NOTE — Assessment & Plan Note (Signed)
Has been off her prozac and not doing well. Will start wellbutrin to help weight and mood. Recheck 1 month. Call with any concerns.

## 2020-01-03 NOTE — Progress Notes (Signed)
Ht 5\' 1"  (1.549 m)   Wt 239 lb 6.4 oz (108.6 kg)   LMP 12/25/2019 (Exact Date)   BMI 45.23 kg/m    Subjective:    Patient ID: Breanna Lamb, female    DOB: 01-01-1991, 29 y.o.   MRN: [id number]  HPI: Breanna Lamb is a 29 y.o. female  Chief Complaint  Patient presents with  . Weight Gain    Pt states she has been noticing weight gain for the last 6 months. States she lost weight after birth and then has gained it all back   . Depression    Pt states she hasn't taken her Prozac in the last month and a half, wants to start back    WEIGHT GAIN Duration: chronic Previous attempts at weight loss: yes, diet exercise, meds as below Complications of obesity: depression Peak weight: 239lb Weight loss goal: 180 Weight loss to date: none Requesting obesity pharmacotherapy: yes Current weight loss supplements/medications: no Previous weight loss supplements/meds: yes - topamax, phentermine  DEPRESSION- hadn't been taking her prozac for about a month and a half and is noticing a huge difference Mood status: exacerbated Satisfied with current treatment?: no Symptom severity: moderate  Duration of current treatment : chronic Side effects: no Medication compliance: poor compliance Psychotherapy/counseling: no  Previous psychiatric medications: prozac Depressed mood: yes Anxious mood: yes Anhedonia: no Significant weight loss or gain: yes Insomnia: no  Fatigue: yes Feelings of worthlessness or guilt: no Impaired concentration/indecisiveness: no Suicidal ideations: no Hopelessness: no Crying spells: no Depression screen [facility] 2/9 01/03/2020 09/03/2019 08/05/2019 06/17/2017 04/18/2017  Decreased Interest 2 0 3 1 2   Down, Depressed, Hopeless 1 0 2 1 2   PHQ - 2 Score 3 0 5 2 4   Altered sleeping 1 0 2 1 1   Tired, decreased energy 2 1 2 2 2   Change in appetite 3 1 3 3 3   Feeling bad or failure about yourself  1 1 2 1 2   Trouble concentrating 1 1 2 1 3   Moving slowly or  fidgety/restless 1 0 2 1 1   Suicidal thoughts 0 0 0 0 0  PHQ-9 Score 12 4 18 11 16   Difficult doing work/chores Somewhat difficult Somewhat difficult Extremely dIfficult Somewhat difficult -    Relevant past medical, surgical, family and social history reviewed and updated as indicated. Interim medical history since our last visit reviewed. Allergies and medications reviewed and updated.  Review of Systems  Constitutional: Negative.   Respiratory: Negative.   Cardiovascular: Negative.   Gastrointestinal: Negative.   Musculoskeletal: Negative.   Psychiatric/Behavioral: Positive for dysphoric mood. Negative for agitation, behavioral problems, confusion, decreased concentration, hallucinations, self-injury, sleep disturbance and suicidal ideas. The patient is not nervous/anxious and is not hyperactive.     Per HPI unless specifically indicated above     Objective:    Ht 5\' 1"  (1.549 m)   Wt 239 lb 6.4 oz (108.6 kg)   LMP 12/25/2019 (Exact Date)   BMI 45.23 kg/m   Wt Readings from Last 3 Encounters:  01/03/20 239 lb 6.4 oz (108.6 kg)  08/05/19 (!) 232 lb (105.2 kg)  05/31/19 230 lb (104.3 kg)    Physical Exam Vitals and nursing note reviewed.  Constitutional:      General: She is not in acute distress.    Appearance: Normal appearance. She is not ill-appearing, toxic-appearing or diaphoretic.  HENT:     Head: Normocephalic and atraumatic.     Right Ear: External ear normal.  Left Ear: External ear normal.     Nose: Nose normal.     Mouth/Throat:     Mouth: Mucous membranes are moist.     Pharynx: Oropharynx is clear.  Eyes:     General: No scleral icterus.       Right eye: No discharge.        Left eye: No discharge.     Conjunctiva/sclera: Conjunctivae normal.     Pupils: Pupils are equal, round, and reactive to light.  Pulmonary:     Effort: Pulmonary effort is normal. No respiratory distress.     Comments: Speaking in full sentences Musculoskeletal:         General: Normal range of motion.     Cervical back: Normal range of motion.  Skin:    Coloration: Skin is not jaundiced or pale.     Findings: No bruising, erythema, lesion or rash.  Neurological:     Mental Status: She is alert and oriented to person, place, and time. Mental status is at baseline.  Psychiatric:        Mood and Affect: Mood normal.        Behavior: Behavior normal.        Thought Content: Thought content normal.        Judgment: Judgment normal.     Results for orders placed or performed in visit on 05/31/19  Cytology - PAP  Result Value Ref Range   Adequacy      Satisfactory for evaluation; transformation zone component PRESENT.   Diagnosis      - Negative for intraepithelial lesion or malignancy (NILM)      Assessment & Plan:   Problem List Items Addressed This Visit      Other   Depression, recurrent (HCC) - Primary    Has been off her prozac and not doing well. Will start wellbutrin to help weight and mood. Recheck 1 month. Call with any concerns.       Morbid obesity (HCC)    Has failed topamax and phentermine in the past. Will start wellbutrin to help weight and mood. Recheck 1 month. Call with any concerns.       Relevant Medications   Naltrexone-buPROPion HCl ER (CONTRAVE) 8-90 MG TB12       Follow up plan: Return in about 4 weeks (around 01/31/2020) for follow up weight and mood.   . This visit was completed via MyChart due to the restrictions of the COVID-19 pandemic. All issues as above were discussed and addressed. Physical exam was done as above through visual confirmation on MyChart. If it was felt that the patient should be evaluated in the office, they were directed there. The patient verbally consented to this visit. . Location of the patient: parking lot . Location of the provider: work . Those involved with this call:  . Provider: Olevia Perches, DO . CMA: Wilhemena Durie, CMA . Front Desk/Registration: Kandice Hams  . Time spent  on call: 25 minutes with patient face to face via video conference. More than 50% of this time was spent in counseling and coordination of care. 40 minutes total spent in review of patient's record and preparation of their chart.

## 2020-01-04 ENCOUNTER — Telehealth: Payer: Self-pay

## 2020-01-04 NOTE — Telephone Encounter (Signed)
PA for Contrave initiated and submitted via Cover My Meds. Key: [id number]

## 2020-01-04 NOTE — Telephone Encounter (Signed)
-----   Message from Dorcas Carrow, DO sent at 01/03/2020  5:06 PM EST ----- 4 weeks follow up weight and mood

## 2020-01-10 NOTE — Telephone Encounter (Signed)
Approved for 70 tablets every 28 days.

## 2020-01-12 ENCOUNTER — Encounter: Payer: Self-pay | Admitting: Family Medicine

## 2020-03-17 ENCOUNTER — Encounter: Payer: No Typology Code available for payment source | Admitting: Family Medicine

## 2020-04-10 ENCOUNTER — Encounter: Payer: Self-pay | Admitting: Family Medicine

## 2020-04-21 ENCOUNTER — Encounter: Payer: No Typology Code available for payment source | Admitting: Family Medicine

## 2020-04-27 ENCOUNTER — Encounter: Payer: No Typology Code available for payment source | Admitting: Family Medicine

## 2020-05-26 ENCOUNTER — Ambulatory Visit: Payer: No Typology Code available for payment source | Admitting: Family Medicine

## 2020-06-15 ENCOUNTER — Other Ambulatory Visit (HOSPITAL_COMMUNITY): Payer: Self-pay

## 2020-07-03 ENCOUNTER — Other Ambulatory Visit: Payer: Self-pay

## 2020-07-03 ENCOUNTER — Ambulatory Visit (INDEPENDENT_AMBULATORY_CARE_PROVIDER_SITE_OTHER): Payer: No Typology Code available for payment source | Admitting: Family Medicine

## 2020-07-03 ENCOUNTER — Encounter: Payer: Self-pay | Admitting: Family Medicine

## 2020-07-03 VITALS — BP 117/72 | HR 81 | Temp 98.1°F | Resp 16 | Ht 61.0 in | Wt 243.8 lb

## 2020-07-03 DIAGNOSIS — Z0000 Encounter for general adult medical examination without abnormal findings: Secondary | ICD-10-CM | POA: Diagnosis not present

## 2020-07-03 DIAGNOSIS — F339 Major depressive disorder, recurrent, unspecified: Secondary | ICD-10-CM | POA: Diagnosis not present

## 2020-07-03 LAB — URINALYSIS, ROUTINE W REFLEX MICROSCOPIC
Bilirubin, UA: NEGATIVE
Glucose, UA: NEGATIVE
Ketones, UA: NEGATIVE
Leukocytes,UA: NEGATIVE
Nitrite, UA: NEGATIVE
Protein,UA: NEGATIVE
Specific Gravity, UA: 1.015 (ref 1.005–1.030)
Urobilinogen, Ur: 0.2 mg/dL (ref 0.2–1.0)
pH, UA: 5 (ref 5.0–7.5)

## 2020-07-03 LAB — MICROSCOPIC EXAMINATION: WBC, UA: NONE SEEN /hpf (ref 0–5)

## 2020-07-03 MED ORDER — TRIAMCINOLONE ACETONIDE 0.5 % EX OINT
1.0000 "application " | TOPICAL_OINTMENT | Freq: Two times a day (BID) | CUTANEOUS | 0 refills | Status: DC
  Filled 2020-07-03: qty 30, 15d supply, fill #0

## 2020-07-03 MED ORDER — ESCITALOPRAM OXALATE 5 MG PO TABS
ORAL_TABLET | ORAL | 3 refills | Status: DC
  Filled 2020-07-03: qty 30, 30d supply, fill #0
  Filled 2020-11-08: qty 30, 30d supply, fill #1
  Filled 2020-12-12: qty 30, 30d supply, fill #2

## 2020-07-03 MED ORDER — SAXENDA 18 MG/3ML ~~LOC~~ SOPN
PEN_INJECTOR | SUBCUTANEOUS | 2 refills | Status: AC
  Filled 2020-07-03 – 2020-07-06 (×2): qty 15, 30d supply, fill #0

## 2020-07-03 MED ORDER — "PEN NEEDLES 5/16"" 31G X 8 MM MISC"
1.0000 | Freq: Every day | 12 refills | Status: DC
  Filled 2020-07-03 – 2020-07-06 (×2): qty 100, 90d supply, fill #0

## 2020-07-03 NOTE — Progress Notes (Signed)
BP 117/72   Pulse 81   Temp 98.1 F (36.7 C) (Oral)   Resp 16   Ht 5\' 1"  (1.549 m)   Wt 243 lb 12.8 oz (110.6 kg)   LMP 06/22/2020 (Exact Date)   BMI 46.07 kg/m    Subjective:    Patient ID: Breanna Lamb, female    DOB: 07/20/1990, 30 y.o.   MRN: [id number]  HPI: Breanna Lamb is a 30 y.o. female presenting on 07/03/2020 for comprehensive medical examination. Current medical complaints include:  DEPRESSION- prozac was making her very hot  Mood status: exacerbated Satisfied with current treatment?: no Symptom severity: moderate  Duration of current treatment : chronic Side effects: yes- feeling hot all the time Medication compliance: fair compliance Psychotherapy/counseling: no  Previous psychiatric medications: zoloft, fluoxetine Depressed mood: yes Anxious mood: yes Anhedonia: no Significant weight loss or gain: yes Insomnia: no  Fatigue: yes Feelings of worthlessness or guilt: no Impaired concentration/indecisiveness: no Suicidal ideations: no Hopelessness: no Crying spells: no Depression screen [facility] 2/9 07/03/2020 01/03/2020 09/03/2019 08/05/2019 06/17/2017  Decreased Interest 2 2 0 3 1  Down, Depressed, Hopeless 1 1 0 2 1  PHQ - 2 Score 3 3 0 5 2  Altered sleeping 2 1 0 2 1  Tired, decreased energy 2 2 1 2 2   Change in appetite 1 3 1 3 3   Feeling bad or failure about yourself  0 1 1 2 1   Trouble concentrating 1 1 1 2 1   Moving slowly or fidgety/restless 0 1 0 2 1  Suicidal thoughts 0 0 0 0 0  PHQ-9 Score 9 12 4 18 11   Difficult doing work/chores Not difficult at all Somewhat difficult Somewhat difficult Extremely dIfficult Somewhat difficult   WEIGHT GAIN Duration: chronic Previous attempts at weight loss: yes, contrave, phentermine, weight watchers, keto Complications of obesity: depression Peak weight: current 243# Weight loss goal: to be healthy Weight loss to date: none Requesting obesity pharmacotherapy: yes Current weight loss  supplements/medications: no Previous weight loss supplements/meds: yes  She currently lives with: husband and kids Menopausal Symptoms: no  Depression Screen done today and results listed below:  Depression screen [facility] 2/9 07/03/2020 01/03/2020 09/03/2019 08/05/2019 06/17/2017  Decreased Interest 2 2 0 3 1  Down, Depressed, Hopeless 1 1 0 2 1  PHQ - 2 Score 3 3 0 5 2  Altered sleeping 2 1 0 2 1  Tired, decreased energy 2 2 1 2 2   Change in appetite 1 3 1 3 3   Feeling bad or failure about yourself  0 1 1 2 1   Trouble concentrating 1 1 1 2 1   Moving slowly or fidgety/restless 0 1 0 2 1  Suicidal thoughts 0 0 0 0 0  PHQ-9 Score 9 12 4 18 11   Difficult doing work/chores Not difficult at all Somewhat difficult Somewhat difficult Extremely dIfficult Somewhat difficult    Past Medical History:  Past Medical History:  Diagnosis Date   Allergic rhinitis    Asthma    childhood   Benign positional vertigo    Bunion    Contraceptive management    Depression    Genital herpes    Genital herpes simplex virus (HSV) infection in mother affecting pregnancy 07/29/2018   Henoch-Schonlein purpura ([place])    Low-lying placenta 11/19/2018   Resolved 23 week anatomy follow up   Lymphadenopathy    Neck pain    Obesity    Ovarian cyst, right    Poor concentration  Premenstrual dysphoric syndrome    Premenstrual dysphoric syndrome    Screening for venereal disease    Skin lesion     Surgical History:  Past Surgical History:  Procedure Laterality Date   BUNIONECTOMY  2010 and 2011   [place]  04/08/2019   Procedure: CESAREAN SECTION WITH BILATERAL TUBAL LIGATION;  Surgeon: [name], MD;  Location: [facility] ORS;  Service: Obstetrics;;   mirena IUD  01/2014   OVARIAN CYST REMOVAL  August 2014   TUBAL LIGATION  04/2019   WISDOM TOOTH EXTRACTION  2014    Medications:  Current Outpatient Medications on File Prior to Visit   Medication Sig   acetaminophen (TYLENOL) 325 MG tablet Take 650 mg by mouth every 6 (six) hours as needed for moderate pain.   ibuprofen (ADVIL) 800 MG tablet Take 1 tablet (800 mg total) by mouth every 8 (eight) hours as needed for moderate pain. (Patient not taking: Reported on 07/03/2020)   No current facility-administered medications on file prior to visit.    Allergies:  Allergies  Allergen Reactions   Nickel Rash    Social History:  Social History   Socioeconomic History   Marital status: Married    Spouse name: Not on file   Number of children: Not on file   Years of education: Not on file   Highest education level: Not on file  Occupational History   Not on file  Tobacco Use   Smoking status: Never   Smokeless tobacco: Never  Vaping Use   Vaping Use: Never used  Substance and Sexual Activity   Alcohol use: No   Drug use: No   Sexual activity: Yes    Birth control/protection: Surgical    Comment: Tubal Ligation  Other Topics Concern   Not on file  Social History Narrative   Not on file   Social Determinants of Health   Financial Resource Strain: Not on file  Food Insecurity: Not on file  Transportation Needs: Not on file  Physical Activity: Not on file  Stress: Not on file  Social Connections: Not on file  Intimate Partner Violence: Not on file   Social History   Tobacco Use  Smoking Status Never  Smokeless Tobacco Never   Social History   Substance and Sexual Activity  Alcohol Use No    Family History:  Family History  Problem Relation Age of Onset   Hypertension Mother    Mental illness Mother        Depression, Anxiety   Hypertension Father    Hypertension Brother    Hypertension Maternal Grandmother    Diabetes Maternal Grandmother    Hyperlipidemia Maternal Grandmother    Heart disease Maternal Grandmother    COPD Maternal Grandmother    Kidney disease Maternal Grandmother    Mental illness Maternal Grandmother    Hypertension  Sister    Mental illness Sister    Heart disease Maternal Grandfather    Heart attack Maternal Grandfather    Cancer Paternal Grandmother     Past medical history, surgical history, medications, allergies, family history and social history reviewed with patient today and changes made to appropriate areas of the chart.   Review of Systems  Constitutional: Negative.   HENT: Negative.    Eyes: Negative.   Respiratory: Negative.    Cardiovascular: Negative.   Gastrointestinal: Negative.   Genitourinary: Negative.   Musculoskeletal: Negative.   Skin:  Positive for rash.  Negative for itching.  Neurological: Negative.   Psychiatric/Behavioral:  Positive for depression. Negative for hallucinations, memory loss, substance abuse and suicidal ideas. The patient is nervous/anxious. The patient does not have insomnia.   All other ROS negative except what is listed above and in the HPI.      Objective:    BP 117/72   Pulse 81   Temp 98.1 F (36.7 C) (Oral)   Resp 16   Ht 5\' 1"  (1.549 m)   Wt 243 lb 12.8 oz (110.6 kg)   LMP 06/22/2020 (Exact Date)   BMI 46.07 kg/m   Wt Readings from Last 3 Encounters:  07/03/20 243 lb 12.8 oz (110.6 kg)  01/03/20 239 lb 6.4 oz (108.6 kg)  08/05/19 (!) 232 lb (105.2 kg)    Physical Exam Vitals and nursing note reviewed. Exam conducted with a chaperone present.  Constitutional:      General: She is not in acute distress.    Appearance: Normal appearance. She is not ill-appearing, toxic-appearing or diaphoretic.  HENT:     Head: Normocephalic and atraumatic.     Right Ear: Tympanic membrane, ear canal and external ear normal. There is no impacted cerumen.     Left Ear: Tympanic membrane, ear canal and external ear normal. There is no impacted cerumen.     Nose: Nose normal. No congestion or rhinorrhea.     Mouth/Throat:     Mouth: Mucous membranes are moist.     Pharynx: Oropharynx is clear. No oropharyngeal exudate or posterior oropharyngeal  erythema.  Eyes:     General: No scleral icterus.       Right eye: No discharge.        Left eye: No discharge.     Extraocular Movements: Extraocular movements intact.     Conjunctiva/sclera: Conjunctivae normal.     Pupils: Pupils are equal, round, and reactive to light.  Neck:     Vascular: No carotid bruit.  Cardiovascular:     Rate and Rhythm: Normal rate and regular rhythm.     Pulses: Normal pulses.     Heart sounds: No murmur heard.   No friction rub. No gallop.  Pulmonary:     Effort: Pulmonary effort is normal. No respiratory distress.     Breath sounds: Normal breath sounds. No stridor. No wheezing, rhonchi or rales.  Chest:     Chest wall: No tenderness.  Breasts:    Right: Normal.     Left: Normal.  Abdominal:     General: Abdomen is flat. Bowel sounds are normal. There is no distension.     Palpations: Abdomen is soft. There is no mass.     Tenderness: There is no abdominal tenderness. There is no right CVA tenderness, left CVA tenderness, guarding or rebound.     Hernia: No hernia is present.  Genitourinary:    Comments: Pelvic exams deferred with shared decision making Musculoskeletal:        General: No swelling, tenderness, deformity or signs of injury.     Cervical back: Normal range of motion and neck supple. No rigidity. No muscular tenderness.     Right lower leg: No edema.     Left lower leg: No edema.  Lymphadenopathy:     Cervical: No cervical adenopathy.  Skin:    General: Skin is warm and dry.     Capillary Refill: Capillary refill takes less than 2 seconds.     Coloration: Skin is not jaundiced or pale.  Findings: No bruising, erythema, lesion or rash.  Neurological:     General: No focal deficit present.     Mental Status: She is alert and oriented to person, place, and time. Mental status is at baseline.     Cranial Nerves: No cranial nerve deficit.     Sensory: No sensory deficit.     Motor: No weakness.     Coordination: Coordination  normal.     Gait: Gait normal.     Deep Tendon Reflexes: Reflexes normal.  Psychiatric:        Mood and Affect: Mood normal.        Behavior: Behavior normal.        Thought Content: Thought content normal.        Judgment: Judgment normal.    Results for orders placed or performed in visit on 05/31/19  Cytology - PAP  Result Value Ref Range   Adequacy      Satisfactory for evaluation; transformation zone component PRESENT.   Diagnosis      - Negative for intraepithelial lesion or malignancy (NILM)      Assessment & Plan:   Problem List Items Addressed This Visit       Other   Depression, recurrent ([place])    Not tolerating her prozac. Will change to lexapro. Recheck 6 weeks. Call with any concerns.        Relevant Medications   escitalopram (LEXAPRO) 5 MG tablet   Morbid obesity ([place])    Will try saxenda. Recheck 6 weeks. Call with any concerns. Continue to monitor.        Relevant Medications   Liraglutide -Weight Management (SAXENDA) 18 MG/3ML SOPN   Other Visit Diagnoses     Routine general medical examination at a health care facility    -  Primary   Vaccines up to date. Screening labs checked today. Pap up to date. Continue diet and exercise. Call with any concerns.    Relevant Orders   CBC with Differential/Platelet   Comprehensive metabolic panel   Lipid Panel w/o Chol/HDL Ratio   Urinalysis, Routine w reflex microscopic   TSH   Hepatitis C Antibody        Follow up plan: Return in about 6 months (around 01/02/2021) for follow up weight and mood.   LABORATORY TESTING:  - Pap smear: up to date  IMMUNIZATIONS:   - Tdap: Tetanus vaccination status reviewed: last tetanus booster within 10 years. - Influenza: Up to date - Pneumovax: Not applicable - Prevnar: Not applicable - COVID: Up to date - Zostavax vaccine: Not applicable  PATIENT COUNSELING:   Advised to take 1 mg of folate supplement per day if capable of pregnancy.   Sexuality:  Discussed sexually transmitted diseases, partner selection, use of condoms, avoidance of unintended pregnancy  and contraceptive alternatives.   Advised to avoid cigarette smoking.  I discussed with the patient that most people either abstain from alcohol or drink within safe limits (<=14/week and <=4 drinks/occasion for males, <=7/weeks and <= 3 drinks/occasion for females) and that the risk for alcohol disorders and other health effects rises proportionally with the number of drinks per week and how often a drinker exceeds daily limits.  Discussed cessation/primary prevention of drug use and availability of treatment for abuse.   Diet: Encouraged to adjust caloric intake to maintain  or achieve ideal body weight, to reduce intake of dietary saturated fat and total fat, to limit sodium intake by avoiding high sodium foods and not  adding table salt, and to maintain adequate dietary potassium and calcium preferably from fresh fruits, vegetables, and low-fat dairy products.    stressed the importance of regular exercise  Injury prevention: Discussed safety belts, safety helmets, smoke detector, smoking near bedding or upholstery.   Dental health: Discussed importance of regular tooth brushing, flossing, and dental visits.    NEXT PREVENTATIVE PHYSICAL DUE IN 1 YEAR. Return in about 6 months (around 01/02/2021) for follow up weight and mood.

## 2020-07-03 NOTE — Assessment & Plan Note (Signed)
Will try saxenda. Recheck 6 weeks. Call with any concerns. Continue to monitor.

## 2020-07-03 NOTE — Assessment & Plan Note (Signed)
Not tolerating her prozac. Will change to lexapro. Recheck 6 weeks. Call with any concerns.

## 2020-07-04 LAB — CBC WITH DIFFERENTIAL/PLATELET
Basophils Absolute: 0 10*3/uL (ref 0.0–0.2)
Basos: 0 %
EOS (ABSOLUTE): 0.3 10*3/uL (ref 0.0–0.4)
Eos: 3 %
Hematocrit: 38.6 % (ref 34.0–46.6)
Hemoglobin: 13.2 g/dL (ref 11.1–15.9)
Immature Grans (Abs): 0 10*3/uL (ref 0.0–0.1)
Immature Granulocytes: 0 %
Lymphocytes Absolute: 3 10*3/uL (ref 0.7–3.1)
Lymphs: 34 %
MCH: 28.3 pg (ref 26.6–33.0)
MCHC: 34.2 g/dL (ref 31.5–35.7)
MCV: 83 fL (ref 79–97)
Monocytes Absolute: 0.6 10*3/uL (ref 0.1–0.9)
Monocytes: 7 %
Neutrophils Absolute: 4.9 10*3/uL (ref 1.4–7.0)
Neutrophils: 56 %
Platelets: 315 10*3/uL (ref 150–450)
RBC: 4.67 x10E6/uL (ref 3.77–5.28)
RDW: 12.4 % (ref 11.7–15.4)
WBC: 8.9 10*3/uL (ref 3.4–10.8)

## 2020-07-04 LAB — COMPREHENSIVE METABOLIC PANEL
ALT: 15 IU/L (ref 0–32)
AST: 22 IU/L (ref 0–40)
Albumin/Globulin Ratio: 1.6 (ref 1.2–2.2)
Albumin: 4.2 g/dL (ref 3.9–5.0)
Alkaline Phosphatase: 98 IU/L (ref 44–121)
BUN/Creatinine Ratio: 15 (ref 9–23)
BUN: 13 mg/dL (ref 6–20)
Bilirubin Total: 0.8 mg/dL (ref 0.0–1.2)
CO2: 25 mmol/L (ref 20–29)
Calcium: 9.4 mg/dL (ref 8.7–10.2)
Chloride: 101 mmol/L (ref 96–106)
Creatinine, Ser: 0.85 mg/dL (ref 0.57–1.00)
Globulin, Total: 2.7 g/dL (ref 1.5–4.5)
Glucose: 75 mg/dL (ref 65–99)
Potassium: 4 mmol/L (ref 3.5–5.2)
Sodium: 139 mmol/L (ref 134–144)
Total Protein: 6.9 g/dL (ref 6.0–8.5)
eGFR: 95 mL/min/{1.73_m2} (ref >59)

## 2020-07-04 LAB — HEPATITIS C ANTIBODY: Hep C Virus Ab: <0.1 s/co ratio (ref 0.0–0.9)

## 2020-07-04 LAB — TSH: TSH: 1.35 u[IU]/mL (ref 0.450–4.500)

## 2020-07-04 LAB — LIPID PANEL W/O CHOL/HDL RATIO
Cholesterol, Total: 204 mg/dL — ABNORMAL HIGH (ref 100–199)
HDL: 49 mg/dL (ref >39)
LDL Chol Calc (NIH): 130 mg/dL — ABNORMAL HIGH (ref 0–99)
Triglycerides: 141 mg/dL (ref 0–149)
VLDL Cholesterol Cal: 25 mg/dL (ref 5–40)

## 2020-07-06 ENCOUNTER — Other Ambulatory Visit: Payer: Self-pay

## 2020-07-06 ENCOUNTER — Telehealth: Payer: Self-pay

## 2020-07-06 NOTE — Telephone Encounter (Signed)
PA for Saxenda initiated and submitted via Cover My Meds. Key: Helen Hashimoto

## 2020-07-07 NOTE — Telephone Encounter (Signed)
PA approved. Patient notified of approval.

## 2020-08-19 ENCOUNTER — Other Ambulatory Visit: Payer: Self-pay | Admitting: Pulmonary Disease

## 2020-08-19 DIAGNOSIS — U071 COVID-19: Secondary | ICD-10-CM

## 2020-08-19 MED ORDER — BUDESONIDE 0.25 MG/2ML IN SUSP: 0.2500 mg | Freq: Two times a day (BID) | RESPIRATORY_TRACT | 2 refills | Status: DC

## 2020-08-19 MED ORDER — MOLNUPIRAVIR EUA 200MG CAPSULE: 4.0000 | ORAL_CAPSULE | Freq: Two times a day (BID) | ORAL | 0 refills | Status: AC

## 2020-08-19 NOTE — Progress Notes (Signed)
Patient with COVID 19 infection.

## 2020-08-21 ENCOUNTER — Ambulatory Visit: Payer: No Typology Code available for payment source | Admitting: Family Medicine

## 2020-08-27 ENCOUNTER — Other Ambulatory Visit: Payer: Self-pay | Admitting: Pulmonary Disease

## 2020-08-30 ENCOUNTER — Other Ambulatory Visit: Payer: Self-pay | Admitting: Pulmonary Disease

## 2020-08-30 ENCOUNTER — Other Ambulatory Visit: Payer: Self-pay

## 2020-08-30 MED ORDER — FLUTICASONE FUROATE-VILANTEROL 200-25 MCG/INH IN AEPB: 1.0000 | INHALATION_SPRAY | Freq: Every day | RESPIRATORY_TRACT | 0 refills | Status: DC

## 2020-10-13 ENCOUNTER — Other Ambulatory Visit: Payer: Self-pay

## 2020-11-08 ENCOUNTER — Other Ambulatory Visit: Payer: Self-pay

## 2020-11-20 ENCOUNTER — Other Ambulatory Visit: Payer: Self-pay

## 2020-11-20 MED ORDER — TRAMADOL HCL 50 MG PO TABS
ORAL_TABLET | ORAL | 0 refills | Status: DC
  Filled 2020-11-20: qty 20, 5d supply, fill #0

## 2020-11-29 ENCOUNTER — Ambulatory Visit: Payer: No Typology Code available for payment source | Admitting: Obstetrics and Gynecology

## 2020-12-04 ENCOUNTER — Other Ambulatory Visit: Payer: Self-pay

## 2020-12-12 ENCOUNTER — Other Ambulatory Visit: Payer: Self-pay

## 2020-12-19 ENCOUNTER — Telehealth: Payer: No Typology Code available for payment source | Admitting: Family Medicine

## 2020-12-19 ENCOUNTER — Other Ambulatory Visit: Payer: Self-pay

## 2020-12-19 DIAGNOSIS — Z20828 Contact with and (suspected) exposure to other viral communicable diseases: Secondary | ICD-10-CM

## 2020-12-19 MED ORDER — OSELTAMIVIR PHOSPHATE 75 MG PO CAPS
75.0000 mg | ORAL_CAPSULE | Freq: Two times a day (BID) | ORAL | 0 refills | Status: AC
  Filled 2020-12-19: qty 10, 5d supply, fill #0

## 2020-12-19 NOTE — Progress Notes (Signed)
Virtual Visit Consent   Breanna Lamb, you are scheduled for a virtual visit with a [facility] provider today.     Just as with appointments in the office, your consent must be obtained to participate.  Your consent will be active for this visit and any virtual visit you may have with one of our providers in the next 365 days.     If you have a MyChart account, a copy of this consent can be sent to you electronically.  All virtual visits are billed to your insurance company just like a traditional visit in the office.    As this is a virtual visit, video technology does not allow for your provider to perform a traditional examination.  This may limit your provider's ability to fully assess your condition.  If your provider identifies any concerns that need to be evaluated in person or the need to arrange testing (such as labs, EKG, etc.), we will make arrangements to do so.     Although advances in technology are sophisticated, we cannot ensure that it will always work on either your end or our end.  If the connection with a video visit is poor, the visit may have to be switched to a telephone visit.  With either a video or telephone visit, we are not always able to ensure that we have a secure connection.     I need to obtain your verbal consent now.   Are you willing to proceed with your visit today?    Breanna Lamb has provided verbal consent on 12/19/2020 for a virtual visit (video or telephone).   Breanna Mayo, NP   Date: 12/19/2020 3:37 PM   Virtual Visit via Video Note   I, Breanna Lamb, connected with  Breanna Lamb  ([id number], [birth year]) on 12/19/20 at  3:50 PM EST by a video-enabled telemedicine application and verified that I am speaking with the correct person using two identifiers.  Location: Patient: Virtual Visit Location Patient: Home Provider: Virtual Visit Location Provider: Home Office   I discussed the limitations of evaluation and management by  telemedicine and the availability of in person appointments. The patient expressed understanding and agreed to proceed.    History of Present Illness: Breanna Lamb is a 30 y.o. who identifies as a female who was assigned female at birth, and is being seen today for exposure to the flu. All family members have Flu A  HPI: Influenza This is a new problem. The current episode started in the past 7 days. Associated symptoms comments: Runny nose that started today .   Problems:  Patient Active Problem List   Diagnosis Date Noted   Depression, recurrent ([place]) 01/03/2020   Morbid obesity ([place]) 01/03/2020   Chronic tension-type headache, not intractable 01/31/2016    Allergies:  Allergies  Allergen Reactions   Nickel Rash   Medications:  Current Outpatient Medications:    acetaminophen (TYLENOL) 325 MG tablet, Take 650 mg by mouth every 6 (six) hours as needed for moderate pain., Disp: , Rfl:    budesonide (PULMICORT) 0.25 MG/2ML nebulizer solution, Take 2 mLs (0.25 mg total) by nebulization 2 (two) times daily., Disp: 60 mL, Rfl: 2   escitalopram (LEXAPRO) 5 MG tablet, Take 1/2 tab for 1 week, then increase to 1 tab daily, Disp: 30 tablet, Rfl: 3   fluticasone furoate-vilanterol (BREO ELLIPTA) 200-25 MCG/INH AEPB, Inhale 1 puff into the lungs daily., Disp: 14 each, Rfl: 0   ibuprofen (  ADVIL) 800 MG tablet, Take 1 tablet (800 mg total) by mouth every 8 (eight) hours as needed for moderate pain. (Patient not taking: Reported on 07/03/2020), Disp: 15 tablet, Rfl: 0   Insulin Pen Needle (PEN NEEDLES 31GX5/16") 31G X 8 MM MISC, use daily., Disp: 100 each, Rfl: 12   traMADol (ULTRAM) 50 MG tablet, Take 1 tablet every 6 hours by oral route., Disp: 20 tablet, Rfl: 0   triamcinolone ointment (KENALOG) 0.5 %, Apply 1 application topically 2 (two) times daily., Disp: 30 g, Rfl: 0  Observations/Objective: Patient is well-developed, well-nourished in no acute distress.  Resting comfortably  at home.   Head is normocephalic, atraumatic.  No labored breathing.  Speech is clear and coherent with logical content.  Patient is alert and oriented at baseline.    Assessment and Plan: 1. Exposure to the flu Family is + for Flu A would like tamiflu  OTC measures reviewed if needed  - oseltamivir (TAMIFLU) 75 MG capsule; Take 1 capsule (75 mg total) by mouth 2 (two) times daily for 5 days.  Dispense: 10 capsule; Refill: 0   Reviewed side effects, risks and benefits of medication.    Patient acknowledged agreement and understanding of the plan.   I discussed the assessment and treatment plan with the patient. The patient was provided an opportunity to ask questions and all were answered. The patient agreed with the plan and demonstrated an understanding of the instructions.   The patient was advised to call back or seek an in-person evaluation if the symptoms worsen or if the condition fails to improve as anticipated.   The above assessment and management plan was discussed with the patient. The patient verbalized understanding of and has agreed to the management plan. Patient is aware to call the clinic if symptoms persist or worsen. Patient is aware when to return to the clinic for a follow-up visit. Patient educated on when it is appropriate to go to the emergency department.   Follow Up Instructions: I discussed the assessment and treatment plan with the patient. The patient was provided an opportunity to ask questions and all were answered. The patient agreed with the plan and demonstrated an understanding of the instructions.  A copy of instructions were sent to the patient via MyChart unless otherwise noted below.     The patient was advised to call back or seek an in-person evaluation if the symptoms worsen or if the condition fails to improve as anticipated.  Time:  I spent 10 minutes with the patient via telehealth technology discussing the above problems/concerns.    Breanna Mayo, NP

## 2020-12-19 NOTE — Patient Instructions (Signed)
I appreciate the opportunity to provide you with care for your health and wellness.  Take medication as directed  I hope your family feels better soon!  Happy Holidays  Please continue to practice social distancing to keep you, your family, and our community safe.  If you must go out, please wear a mask and practice good handwashing.  Have a wonderful day. With Gratitude, Cherly Beach, DNP, AGNP-BC

## 2020-12-20 ENCOUNTER — Encounter: Payer: Self-pay | Admitting: Emergency Medicine

## 2020-12-20 ENCOUNTER — Ambulatory Visit
Admission: EM | Admit: 2020-12-20 | Discharge: 2020-12-20 | Disposition: A | Discharge status: Home / Self Care | Payer: No Typology Code available for payment source | Attending: Medical Oncology | Admitting: Medical Oncology

## 2020-12-20 ENCOUNTER — Other Ambulatory Visit: Payer: Self-pay

## 2020-12-20 DIAGNOSIS — Z20828 Contact with and (suspected) exposure to other viral communicable diseases: Secondary | ICD-10-CM

## 2020-12-20 DIAGNOSIS — B349 Viral infection, unspecified: Secondary | ICD-10-CM

## 2020-12-20 LAB — POCT INFLUENZA A/B
Influenza A, POC: NEGATIVE
Influenza B, POC: NEGATIVE

## 2020-12-20 MED ORDER — ONDANSETRON HCL 4 MG PO TABS
4.0000 mg | ORAL_TABLET | Freq: Four times a day (QID) | ORAL | 0 refills | Status: DC
  Filled 2020-12-20: qty 12, 3d supply, fill #0

## 2020-12-20 NOTE — ED Triage Notes (Signed)
Pt here with direct flu exposure and experiencing congestion, cough and nausea since this morning.

## 2020-12-20 NOTE — ED Provider Notes (Signed)
Breanna Lamb    CSN: [id number] Arrival date & time: 12/20/20  1003      History   Chief Complaint Chief Complaint  Patient presents with   Cough   Nasal Congestion   Nausea    HPI Breanna Lamb is a 30 y.o. female.   HPI  Cough: Pt reports that she has a close influenza exposure. She then was started on Tamiflu yesterday. She reports that last night she developed nasal congestion which has worsened into today but when she first took the tamiflu she was asymptomatic. After taking a dose this morning of tamiflu she threw up (non-bloody). She now feels better and has been able to tolerate fluids and food. Since this occurred at work health at work wanted her to have a OCVID-19 test here. She denies fevers, cough, abdominal pain.  Past Medical History:  Diagnosis Date   Allergic rhinitis    Asthma    childhood   Benign positional vertigo    Bunion    Contraceptive management    Depression    Genital herpes    Genital herpes simplex virus (HSV) infection in mother affecting pregnancy 07/29/2018   Henoch-Schonlein purpura ([place])    Low-lying placenta 11/19/2018   Resolved 23 week anatomy follow up   Lymphadenopathy    Neck pain    Obesity    Ovarian cyst, right    Poor concentration    Premenstrual dysphoric syndrome    Premenstrual dysphoric syndrome    Screening for venereal disease    Skin lesion     Patient Active Problem List   Diagnosis Date Noted   Depression, recurrent ([place]) 01/03/2020   Morbid obesity ([place]) 01/03/2020   Chronic tension-type headache, not intractable 01/31/2016    Past Surgical History:  Procedure Laterality Date   BUNIONECTOMY  2010 and 2011   [place] WITH BILATERAL TUBAL LIGATION  04/08/2019   Procedure: CESAREAN SECTION WITH BILATERAL TUBAL LIGATION;  Surgeon: [name], MD;  Location: [facility];  Service: Obstetrics;;   mirena IUD  01/2014   OVARIAN CYST REMOVAL  August 2014   TUBAL  LIGATION  04/2019   WISDOM TOOTH EXTRACTION  2014    OB History     Gravida  3   Para  3   Term  3   Preterm      AB      Living  3      SAB      IAB      Ectopic      Multiple  0   Live Births  3            Home Medications    Prior to Admission medications   Medication Sig Start Date End Date Taking? Authorizing Provider  acetaminophen (TYLENOL) 325 MG tablet Take 650 mg by mouth every 6 (six) hours as needed for moderate pain.    [provider]  budesonide (PULMICORT) 0.25 MG/2ML nebulizer solution Take 2 mLs (0.25 mg total) by nebulization 2 (two) times daily. 08/19/20   Tyler Pita, MD  escitalopram (LEXAPRO) 5 MG tablet Take 1/2 tab for 1 week, then increase to 1 tab daily 07/03/20   Wynetta Emery, Megan P, DO  fluticasone furoate-vilanterol (BREO ELLIPTA) 200-25 MCG/INH AEPB Inhale 1 puff into the lungs daily. 08/30/20   Tyler Pita, MD  ibuprofen (ADVIL) 800 MG tablet Take 1 tablet (800 mg total) by mouth every 8 (eight)  hours as needed for moderate pain. Patient not taking: Reported on 07/03/2020 04/10/19   Rod Can, CNM  Insulin Pen Needle (PEN NEEDLES 31GX5/16") 31G X 8 MM MISC use daily. 07/03/20   Park Liter P, DO  oseltamivir (TAMIFLU) 75 MG capsule Take 1 capsule (75 mg total) by mouth 2 (two) times daily for 5 days. 12/19/20 12/24/20  Perlie Mayo, NP  traMADol (ULTRAM) 50 MG tablet Take 1 tablet every 6 hours by oral route. 11/20/20     triamcinolone ointment (KENALOG) 0.5 % Apply 1 application topically 2 (two) times daily. 07/03/20   [name], DO    Family History Family History  Problem Relation Age of Onset   Hypertension Mother    Mental illness Mother        Depression, Anxiety   Hypertension Father    Hypertension Brother    Hypertension Maternal Grandmother    Diabetes Maternal Grandmother    Hyperlipidemia Maternal Grandmother    Heart disease Maternal Grandmother    COPD Maternal Grandmother     Kidney disease Maternal Grandmother    Mental illness Maternal Grandmother    Hypertension Sister    Mental illness Sister    Heart disease Maternal Grandfather    Heart attack Maternal Grandfather    Cancer Paternal Grandmother     Social History Social History   Tobacco Use   Smoking status: Never   Smokeless tobacco: Never  Vaping Use   Vaping Use: Never used  Substance Use Topics   Alcohol use: No   Drug use: No     Allergies   Nickel   Review of Systems Review of Systems  As stated above in HPI Physical Exam Triage Vital Signs ED Triage Vitals  Enc Vitals Group     BP 12/20/20 1021 (!) 141/94     Pulse Rate 12/20/20 1021 77     Resp 12/20/20 1021 16     Temp 12/20/20 1021 98.2 F (36.8 C)     Temp Source 12/20/20 1021 Oral     SpO2 12/20/20 1021 97 %     Weight --      Height --      Head Circumference --      Peak Flow --      Pain Score 12/20/20 1018 3     Pain Loc --      Pain Edu? --      Excl. in [place]? --    No data found.  Updated Vital Signs BP (!) 141/94 (BP Location: Left Wrist)    Pulse 77    Temp 98.2 F (36.8 C) (Oral)    Resp 16    SpO2 97%   Physical Exam Vitals and nursing note reviewed.  Constitutional:      General: She is not in acute distress.    Appearance: Normal appearance. She is not ill-appearing, toxic-appearing or diaphoretic.  HENT:     Head: Normocephalic and atraumatic.     Right Ear: Tympanic membrane normal.     Left Ear: Tympanic membrane normal.     Nose: Congestion present. No rhinorrhea.     Mouth/Throat:     Mouth: Mucous membranes are moist.     Pharynx: No oropharyngeal exudate or posterior oropharyngeal erythema.  Eyes:     Extraocular Movements: Extraocular movements intact.     Pupils: Pupils are equal, round, and reactive to light.  Cardiovascular:     Rate and Rhythm: Normal rate and regular rhythm.  Heart sounds: Normal heart sounds.  Pulmonary:     Effort: Pulmonary effort is normal.      Breath sounds: Normal breath sounds.  Abdominal:     General: Bowel sounds are normal. There is no distension.     Palpations: Abdomen is soft.     Tenderness: There is no abdominal tenderness. There is no guarding.  Musculoskeletal:     Cervical back: Normal range of motion and neck supple.  Skin:    General: Skin is warm.  Neurological:     Mental Status: She is alert and oriented to person, place, and time.     UC Treatments / Results  Labs (all labs ordered are listed, but only abnormal results are displayed) Labs Reviewed  NOVEL CORONAVIRUS, NAA  POCT INFLUENZA A/B    EKG   Radiology No results found.  Procedures Procedures (including critical care time)  Medications Ordered in UC Medications - No data to display  Initial Impression / Assessment and Plan / UC Course  I have reviewed the triage vital signs and the nursing notes.  Pertinent labs & imaging results that were available during my care of the patient were reviewed by me and considered in my medical decision making (see chart for details).     New. Influenza exposure with symptoms. In office testing is negative however this is likely from her having started the Tamiflu. Vomiting likely from Tamiflu. Given her higher risk status with asthma we both agree that continuing Tamiflu would be beneficial especially since she has started to have nasal congestion. Will add on zofran to help with tolerance of the Tamiflu. Discussed. COVID-19 testing pending.   Final Clinical Impressions(s) / UC Diagnoses   Final diagnoses:  Viral illness   Discharge Instructions   None    ED Prescriptions   None    PDMP not reviewed this encounter.   Hughie Closs, [place] 12/20/20 1057

## 2020-12-21 LAB — SARS-COV-2, NAA 2 DAY TAT

## 2020-12-21 LAB — NOVEL CORONAVIRUS, NAA: SARS-CoV-2, NAA: NOT DETECTED

## 2021-01-03 ENCOUNTER — Encounter: Payer: Self-pay | Admitting: Family Medicine

## 2021-01-05 ENCOUNTER — Encounter: Payer: Self-pay | Admitting: Family Medicine

## 2021-01-05 ENCOUNTER — Ambulatory Visit: Payer: No Typology Code available for payment source | Admitting: Family Medicine

## 2021-01-05 ENCOUNTER — Telehealth (INDEPENDENT_AMBULATORY_CARE_PROVIDER_SITE_OTHER): Payer: No Typology Code available for payment source | Admitting: Family Medicine

## 2021-01-05 ENCOUNTER — Other Ambulatory Visit: Payer: Self-pay

## 2021-01-05 VITALS — Wt 246.4 lb

## 2021-01-05 DIAGNOSIS — F339 Major depressive disorder, recurrent, unspecified: Secondary | ICD-10-CM | POA: Diagnosis not present

## 2021-01-05 MED ORDER — OZEMPIC (0.25 OR 0.5 MG/DOSE) 2 MG/1.5ML ~~LOC~~ SOPN
PEN_INJECTOR | SUBCUTANEOUS | 0 refills | Status: DC
  Filled 2021-01-05: qty 1.5, 30d supply, fill #0

## 2021-01-05 MED ORDER — SEMAGLUTIDE (1 MG/DOSE) 4 MG/3ML ~~LOC~~ SOPN
1.0000 mg | PEN_INJECTOR | SUBCUTANEOUS | 3 refills | Status: DC
  Filled 2021-01-05 – 2021-03-12 (×2): qty 3, 28d supply, fill #0
  Filled 2021-04-09: qty 3, 28d supply, fill #1

## 2021-01-05 MED ORDER — "PEN NEEDLES 5/16"" 31G X 8 MM MISC"
1.0000 | Freq: Every day | 12 refills | Status: DC
  Filled 2021-01-05: qty 100, 90d supply, fill #0

## 2021-01-05 MED ORDER — FLUOXETINE HCL 20 MG PO TABS
ORAL_TABLET | ORAL | 3 refills | Status: DC
  Filled 2021-01-05: qty 60, 30d supply, fill #0
  Filled 2021-02-08: qty 60, 30d supply, fill #1

## 2021-01-05 NOTE — Assessment & Plan Note (Signed)
Could not tolerate saxenda. Failed topamax, phentermine and contrave. Will start ozempic. Call with any concerns. Continue to monitor. Recheck 6 weeks.

## 2021-01-05 NOTE — Progress Notes (Signed)
Wt 246 lb 6.4 oz (111.8 kg)    BMI 46.56 kg/m    Subjective:    Patient ID: Breanna Lamb, female    DOB: 19-May-1990, 30 y.o.   MRN: [id number]  HPI: Breanna Lamb is a 30 y.o. female  Chief Complaint  Patient presents with   Depression   Obesity    Patient did not complete saxenda due to experiencing night terrors.    OBESTIY- Was not able to do the saxenda due to night terrors.  Duration: chronic Previous attempts at weight loss: yes Complications of obesity: depression Peak weight: current (246) Weight loss goal: to be healthy Weight loss to date: none Requesting obesity pharmacotherapy: yes Current weight loss supplements/medications: no Previous weight loss supplements/meds: yes  DEPRESSION Mood status: uncontrolled Satisfied with current treatment?: no Symptom severity: moderate  Duration of current treatment : chronic Side effects: no Medication compliance: good compliance Psychotherapy/counseling: no  Previous psychiatric medications: lexapro, prozac Depressed mood: yes Anxious mood: yes Anhedonia: no Significant weight loss or gain: no Insomnia: no  Fatigue: yes Feelings of worthlessness or guilt: yes Impaired concentration/indecisiveness: yes Suicidal ideations: no Hopelessness: no Crying spells: no Depression screen [facility] 2/9 01/05/2021 07/03/2020 01/03/2020 09/03/2019 08/05/2019  Decreased Interest 2 2 2  0 3  Down, Depressed, Hopeless 1 1 1  0 2  PHQ - 2 Score 3 3 3  0 5  Altered sleeping 2 2 1  0 2  Tired, decreased energy 2 2 2 1 2   Change in appetite 3 1 3 1 3   Feeling bad or failure about yourself  1 0 1 1 2   Trouble concentrating 1 1 1 1 2   Moving slowly or fidgety/restless 0 0 1 0 2  Suicidal thoughts 0 0 0 0 0  PHQ-9 Score 12 9 12 4 18   Difficult doing work/chores Somewhat difficult Not difficult at all Somewhat difficult Somewhat difficult Extremely dIfficult    Relevant past medical, surgical, family and social history reviewed and  updated as indicated. Interim medical history since our last visit reviewed. Allergies and medications reviewed and updated.  Review of Systems  Constitutional: Negative.   Respiratory: Negative.    Cardiovascular: Negative.   Musculoskeletal: Negative.   Psychiatric/Behavioral:  Positive for dysphoric mood. Negative for agitation, behavioral problems, confusion, decreased concentration, hallucinations, self-injury, sleep disturbance and suicidal ideas. The patient is nervous/anxious. The patient is not hyperactive.    Per HPI unless specifically indicated above     Objective:    Wt 246 lb 6.4 oz (111.8 kg)    BMI 46.56 kg/m   Wt Readings from Last 3 Encounters:  01/05/21 246 lb 6.4 oz (111.8 kg)  07/03/20 243 lb 12.8 oz (110.6 kg)  01/03/20 239 lb 6.4 oz (108.6 kg)    Physical Exam Vitals and nursing note reviewed.  Constitutional:      General: She is not in acute distress.    Appearance: Normal appearance. She is not ill-appearing, toxic-appearing or diaphoretic.  HENT:     Head: Normocephalic and atraumatic.     Right Ear: External ear normal.     Left Ear: External ear normal.     Nose: Nose normal.     Mouth/Throat:     Mouth: Mucous membranes are moist.     Pharynx: Oropharynx is clear.  Eyes:     General: No scleral icterus.       Right eye: No discharge.        Left eye: No discharge.  Conjunctiva/sclera: Conjunctivae normal.     Pupils: Pupils are equal, round, and reactive to light.  Pulmonary:     Effort: Pulmonary effort is normal. No respiratory distress.     Comments: Speaking in full sentences Musculoskeletal:        General: Normal range of motion.     Cervical back: Normal range of motion.  Skin:    Coloration: Skin is not jaundiced or pale.     Findings: No bruising, erythema, lesion or rash.  Neurological:     Mental Status: She is alert and oriented to person, place, and time. Mental status is at baseline.  Psychiatric:        Mood and  Affect: Mood normal.        Behavior: Behavior normal.        Thought Content: Thought content normal.        Judgment: Judgment normal.    Results for orders placed or performed during the hospital encounter of 12/20/20  Novel Coronavirus, NAA ([facility])   Specimen: Nasopharyngeal Swab; Nasopharyngeal(NP) swabs in vial transport medium   Nasopharynge  Patient  Result Value Ref Range   SARS-CoV-2, NAA Not Detected Not Detected  SARS-COV-2, NAA 2 DAY TAT   Nasopharynge  Patient  Result Value Ref Range   SARS-CoV-2, NAA 2 DAY TAT Performed   POCT Influenza A/B  Result Value Ref Range   Influenza A, POC Negative Negative   Influenza B, POC Negative Negative      Assessment & Plan:   Problem List Items Addressed This Visit       Other   Depression, recurrent ([place]) - Primary    Will get her back on her prozac. Recheck 6 weeks. Call with any concerns. Continue to monitor.       Relevant Medications   FLUoxetine (PROZAC) 20 MG tablet   Morbid obesity ([place])    Could not tolerate saxenda. Failed topamax, phentermine and contrave. Will start ozempic. Call with any concerns. Continue to monitor. Recheck 6 weeks.       Relevant Medications   Semaglutide,0.25 or 0.5MG /DOS, (OZEMPIC, 0.25 OR 0.5 MG/DOSE,) 2 MG/1.5ML SOPN   Semaglutide, 1 MG/DOSE, 4 MG/3ML SOPN     Follow up plan: Return in about 6 weeks (around 02/16/2021).   This visit was completed via video visit through MyChart due to the restrictions of the COVID-19 pandemic. All issues as above were discussed and addressed. Physical exam was done as above through visual confirmation on video through MyChart. If it was felt that the patient should be evaluated in the office, they were directed there. The patient verbally consented to this visit. Location of the patient: home Location of the provider: work Those involved with this call:  Provider: Park Liter, DO CMA: Louanna Raw, [place] Desk/Registration: [employer]  Time spent on call:  25 minutes with patient face to face via video conference. More than 50% of this time was spent in counseling and coordination of care. 40 minutes total spent in review of patient's record and preparation of their chart.

## 2021-01-05 NOTE — Assessment & Plan Note (Signed)
Will get her back on her prozac. Recheck 6 weeks. Call with any concerns. Continue to monitor.

## 2021-01-11 ENCOUNTER — Telehealth: Payer: No Typology Code available for payment source | Admitting: Family Medicine

## 2021-01-31 ENCOUNTER — Other Ambulatory Visit: Payer: Self-pay | Admitting: Family Medicine

## 2021-01-31 ENCOUNTER — Other Ambulatory Visit: Payer: Self-pay

## 2021-01-31 NOTE — Telephone Encounter (Signed)
Requested medication (s) are due for refill today: yes, 1.5 ml was not enough to complete instructions  Requested medication (s) are on the active medication list: yes  Last refill:  01/05/21  Future visit scheduled: 02/19/21  Notes to clinic:  pharm requesting new signature. Please assess. Requested Prescriptions  Pending Prescriptions Disp Refills   OZEMPIC, 0.25 OR 0.5 MG/DOSE, 2 MG/1.5ML SOPN [Pharmacy Med Name: Semaglutide,0.25 or 0.5MG /DOS, (OZEMPIC, 0.25 OR 0.5 MG/DOSE,) 2 MG/1.5ML Solution Pen-injector] 1.5 mL 0    Sig: Inject 0.25 mg into the skin once a week for 28 days, THEN 0.5 mg once a week for 28 days.     Endocrinology:  Diabetes - GLP-1 Receptor Agonists Failed - 01/31/2021  2:13 PM      Failed - HBA1C is between 0 and 7.9 and within 180 days    HB A1C (BAYER DCA - WAIVED)  Date Value Ref Range Status  03/02/2018 5.1 <7.0 % Final    Comment:                                          Diabetic Adult            <7.0                                       Healthy Adult        4.3 - 5.7                                                           (DCCT/NGSP) American Diabetes Association's Summary of Glycemic Recommendations for Adults with Diabetes: Hemoglobin A1c <7.0%. More stringent glycemic goals (A1c <6.0%) may further reduce complications at the cost of increased risk of hypoglycemia.           Passed - Valid encounter within last 6 months    Recent Outpatient Visits           3 weeks ago Depression, recurrent ([place])   [place], Megan P, DO   7 months ago Routine general medical examination at a health care facility   [facility], [place], DO   1 year ago Depression, recurrent [facility])   [place], [place], DO   1 year ago Postpartum mood disturbance   [place] P, DO   1 year ago Chronic tension-type headache, not intractable   [facility] Eulogio Bear, NP       Future Appointments             In 2 weeks Wynetta Emery, Barb Merino, DO [facility], [facility]

## 2021-02-01 ENCOUNTER — Other Ambulatory Visit: Payer: Self-pay

## 2021-02-02 ENCOUNTER — Other Ambulatory Visit: Payer: Self-pay

## 2021-02-02 MED ORDER — OZEMPIC (0.25 OR 0.5 MG/DOSE) 2 MG/1.5ML ~~LOC~~ SOPN
PEN_INJECTOR | SUBCUTANEOUS | 0 refills | Status: DC
  Filled 2021-02-02: qty 1.5, 30d supply, fill #0

## 2021-02-02 MED FILL — Semaglutide Soln Pen-inj 0.25 or 0.5 MG/DOSE (2 MG/1.5ML): SUBCUTANEOUS | 56 days supply | Qty: 3 | Fill #0 | Status: CN

## 2021-02-05 ENCOUNTER — Other Ambulatory Visit: Payer: Self-pay

## 2021-02-08 ENCOUNTER — Other Ambulatory Visit: Payer: Self-pay

## 2021-02-09 ENCOUNTER — Other Ambulatory Visit: Payer: Self-pay

## 2021-02-16 ENCOUNTER — Telehealth: Payer: No Typology Code available for payment source | Admitting: Family Medicine

## 2021-02-19 ENCOUNTER — Other Ambulatory Visit: Payer: Self-pay

## 2021-02-19 ENCOUNTER — Telehealth (INDEPENDENT_AMBULATORY_CARE_PROVIDER_SITE_OTHER): Payer: No Typology Code available for payment source | Admitting: Family Medicine

## 2021-02-19 ENCOUNTER — Encounter: Payer: Self-pay | Admitting: Family Medicine

## 2021-02-19 DIAGNOSIS — F339 Major depressive disorder, recurrent, unspecified: Secondary | ICD-10-CM

## 2021-02-19 MED ORDER — FLUOXETINE HCL 20 MG PO TABS
ORAL_TABLET | ORAL | 1 refills | Status: DC
  Filled 2021-02-19: qty 180, fill #0
  Filled 2021-03-12: qty 60, 30d supply, fill #0
  Filled 2021-05-02: qty 60, 30d supply, fill #1
  Filled 2021-06-14: qty 60, 30d supply, fill #2
  Filled 2021-07-30: qty 60, 30d supply, fill #3

## 2021-02-19 NOTE — Assessment & Plan Note (Signed)
Under good control on current regimen. Continue current regimen. Continue to monitor. Call with any concerns. Refills given.   

## 2021-02-19 NOTE — Progress Notes (Signed)
Wt 219 lb 6.4 oz (99.5 kg)    BMI 41.46 kg/m    Subjective:    Patient ID: Breanna Lamb, female    DOB: [birth year], 31 y.o.   MRN: [id number]  HPI: Breanna Lamb is a 31 y.o. female  Chief Complaint  Patient presents with   Depression   Weight Check   OBESITY Duration: 6 weeks Previous attempts at weight loss: yes Complications of obesity: depression Peak weight: 246 Weight loss goal: 219  Weight loss to date: 27lbs!! Requesting obesity pharmacotherapy: yes Current weight loss supplements/medications: yes Previous weight loss supplements/meds: yes  DEPRESSION Mood status: better Satisfied with current treatment?: yes Symptom severity: mild  Duration of current treatment :  6 weeks Side effects: no Medication compliance: excellent compliance Psychotherapy/counseling: no  Previous psychiatric medications: prozac Depressed mood: yes Anxious mood: yes Anhedonia: no Significant weight loss or gain: yes Insomnia: no  Fatigue: yes Feelings of worthlessness or guilt: no Impaired concentration/indecisiveness: no Suicidal ideations: no Hopelessness: no Crying spells: no Depression screen [facility] 2/9 02/19/2021 01/05/2021 07/03/2020 01/03/2020 09/03/2019  Decreased Interest 1 2 2 2  0  Down, Depressed, Hopeless 1 1 1 1  0  PHQ - 2 Score 2 3 3 3  0  Altered sleeping 2 2 2 1  0  Tired, decreased energy 2 2 2 2 1   Change in appetite 0 3 1 3 1   Feeling bad or failure about yourself  0 1 0 1 1  Trouble concentrating 1 1 1 1 1   Moving slowly or fidgety/restless 0 0 0 1 0  Suicidal thoughts 0 0 0 0 0  PHQ-9 Score 7 12 9 12 4   Difficult doing work/chores Not difficult at all Somewhat difficult Not difficult at all Somewhat difficult Somewhat difficult  Some recent data might be hidden      Relevant past medical, surgical, family and social history reviewed and updated as indicated. Interim medical history since our last visit reviewed. Allergies and medications reviewed and  updated.  Review of Systems  Constitutional: Negative.   Respiratory: Negative.    Cardiovascular: Negative.   Gastrointestinal: Negative.   Musculoskeletal: Negative.   Neurological: Negative.   Psychiatric/Behavioral: Negative.     Per HPI unless specifically indicated above     Objective:    Wt 219 lb 6.4 oz (99.5 kg)    BMI 41.46 kg/m   Wt Readings from Last 3 Encounters:  02/19/21 219 lb 6.4 oz (99.5 kg)  01/05/21 246 lb 6.4 oz (111.8 kg)  07/03/20 243 lb 12.8 oz (110.6 kg)    Physical Exam Vitals and nursing note reviewed.  Constitutional:      General: She is not in acute distress.    Appearance: Normal appearance. She is not ill-appearing, toxic-appearing or diaphoretic.  HENT:     Head: Normocephalic and atraumatic.     Right Ear: External ear normal.     Left Ear: External ear normal.     Nose: Nose normal.     Mouth/Throat:     Mouth: Mucous membranes are moist.     Pharynx: Oropharynx is clear.  Eyes:     General: No scleral icterus.       Right eye: No discharge.        Left eye: No discharge.     Conjunctiva/sclera: Conjunctivae normal.     Pupils: Pupils are equal, round, and reactive to light.  Pulmonary:     Effort: Pulmonary effort is normal. No respiratory distress.  Comments: Speaking in full sentences Musculoskeletal:        General: Normal range of motion.     Cervical back: Normal range of motion.  Skin:    Coloration: Skin is not jaundiced or pale.     Findings: No bruising, erythema, lesion or rash.  Neurological:     Mental Status: She is alert and oriented to person, place, and time. Mental status is at baseline.  Psychiatric:        Mood and Affect: Mood normal.        Behavior: Behavior normal.        Thought Content: Thought content normal.        Judgment: Judgment normal.    Results for orders placed or performed during the hospital encounter of 12/20/20  Novel Coronavirus, NAA ([facility])   Specimen: Nasopharyngeal Swab;  Nasopharyngeal(NP) swabs in vial transport medium   Nasopharynge  Patient  Result Value Ref Range   SARS-CoV-2, NAA Not Detected Not Detected  SARS-COV-2, NAA 2 DAY TAT   Nasopharynge  Patient  Result Value Ref Range   SARS-CoV-2, NAA 2 DAY TAT Performed   POCT Influenza A/B  Result Value Ref Range   Influenza A, POC Negative Negative   Influenza B, POC Negative Negative      Assessment & Plan:   Problem List Items Addressed This Visit       Other   Depression, recurrent ([place])    Under good control on current regimen. Continue current regimen. Continue to monitor. Call with any concerns. Refills given.        Relevant Medications   FLUoxetine (PROZAC) 20 MG tablet   Morbid obesity ([place])    Congratulated patient on 27lb weight loss!! Continue ozempic. Continue to monitor. Call with any concerns.         Follow up plan: Return in about 3 months (around 05/19/2021).    This visit was completed via video visit through MyChart due to the restrictions of the COVID-19 pandemic. All issues as above were discussed and addressed. Physical exam was done as above through visual confirmation on video through MyChart. If it was felt that the patient should be evaluated in the office, they were directed there. The patient verbally consented to this visit. Location of the patient: home Location of the provider: work Those involved with this call:  Provider: Park Liter, DO CMA: Louanna Raw, [place] Desk/Registration: [employer]  Time spent on call:  25 minutes with patient face to face via video conference. More than 50% of this time was spent in counseling and coordination of care. 40 minutes total spent in review of patient's record and preparation of their chart.

## 2021-02-19 NOTE — Assessment & Plan Note (Signed)
Congratulated patient on 27lb weight loss!! Continue ozempic. Continue to monitor. Call with any concerns.

## 2021-02-21 NOTE — Progress Notes (Signed)
Lmom to schedule f/u appointment.

## 2021-03-08 ENCOUNTER — Encounter: Payer: Self-pay | Admitting: Family Medicine

## 2021-03-12 ENCOUNTER — Other Ambulatory Visit: Payer: Self-pay

## 2021-03-13 ENCOUNTER — Other Ambulatory Visit: Payer: Self-pay

## 2021-03-15 ENCOUNTER — Other Ambulatory Visit: Payer: Self-pay

## 2021-04-09 ENCOUNTER — Encounter: Payer: Self-pay | Admitting: Family Medicine

## 2021-04-09 ENCOUNTER — Other Ambulatory Visit: Payer: Self-pay | Admitting: Family Medicine

## 2021-04-09 ENCOUNTER — Other Ambulatory Visit: Payer: Self-pay

## 2021-04-09 MED ORDER — SEMAGLUTIDE-WEIGHT MANAGEMENT 2.4 MG/0.75ML ~~LOC~~ SOAJ
2.4000 mg | SUBCUTANEOUS | 1 refills | Status: DC
  Filled 2021-04-09: qty 9, fill #0

## 2021-04-09 MED ORDER — SEMAGLUTIDE-WEIGHT MANAGEMENT 1.7 MG/0.75ML ~~LOC~~ SOAJ
1.7000 mg | SUBCUTANEOUS | 0 refills | Status: DC
  Filled 2021-04-09: qty 3, 28d supply, fill #0

## 2021-04-10 ENCOUNTER — Other Ambulatory Visit: Payer: Self-pay | Admitting: Family Medicine

## 2021-04-10 ENCOUNTER — Other Ambulatory Visit: Payer: Self-pay

## 2021-04-10 MED ORDER — SEMAGLUTIDE-WEIGHT MANAGEMENT 1.7 MG/0.75ML ~~LOC~~ SOAJ
1.7000 mg | SUBCUTANEOUS | 0 refills | Status: AC
  Filled 2021-04-10 – 2021-04-23 (×7): qty 3, 28d supply, fill #0

## 2021-04-10 MED ORDER — SEMAGLUTIDE-WEIGHT MANAGEMENT 2.4 MG/0.75ML ~~LOC~~ SOAJ
2.4000 mg | SUBCUTANEOUS | 1 refills | Status: DC
Start: 2021-05-10 — End: 2021-08-01
  Filled 2021-04-10: qty 9, fill #0
  Filled 2021-05-16: qty 3, 28d supply, fill #0
  Filled 2021-06-14: qty 3, 28d supply, fill #1
  Filled 2021-07-09: qty 3, 28d supply, fill #2

## 2021-04-11 ENCOUNTER — Other Ambulatory Visit: Payer: Self-pay

## 2021-04-12 ENCOUNTER — Other Ambulatory Visit: Payer: Self-pay

## 2021-04-12 ENCOUNTER — Telehealth: Payer: Self-pay

## 2021-04-12 NOTE — Telephone Encounter (Signed)
PA for [facility] initiated and submitted via Cover My Meds. Key: [id number] ?

## 2021-04-13 ENCOUNTER — Other Ambulatory Visit: Payer: Self-pay

## 2021-04-17 ENCOUNTER — Other Ambulatory Visit: Payer: Self-pay

## 2021-04-18 NOTE — Telephone Encounter (Signed)
PA approved. Patient notified of approval via [place].  ?

## 2021-04-23 ENCOUNTER — Other Ambulatory Visit: Payer: Self-pay

## 2021-05-02 ENCOUNTER — Other Ambulatory Visit: Payer: Self-pay

## 2021-05-03 ENCOUNTER — Other Ambulatory Visit: Payer: Self-pay

## 2021-05-16 ENCOUNTER — Other Ambulatory Visit: Payer: Self-pay

## 2021-06-01 IMAGING — CT CT ANGIO CHEST
2 of 6 series · 19 of 46 positions shown · IV contrast (APPLIED)
Comparison: Chest x-ray 03/24/2019

CLINICAL DATA: Shortness of breath

EXAM:
CT ANGIOGRAPHY CHEST WITH CONTRAST
TECHNIQUE: Multidetector CT imaging of the chest was performed using the
standard protocol during bolus administration of intravenous
contrast. Multiplanar CT image reconstructions and MIPs were
obtained to evaluate the vascular anatomy.
CONTRAST:  75mL OMNIPAQUE IOHEXOL 350 MG/ML SOLN

[Series 5: thins · axial · 0.69mm/px · z∈[-508,-303]mm · 17 of 225 slices shown]
[im 10/225  lung]
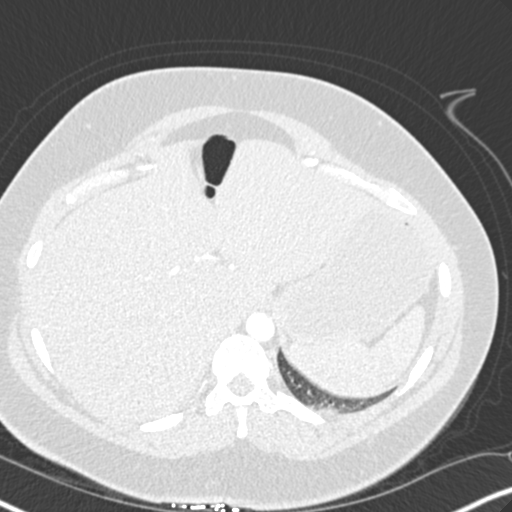
[im 20/225  soft-tissue]
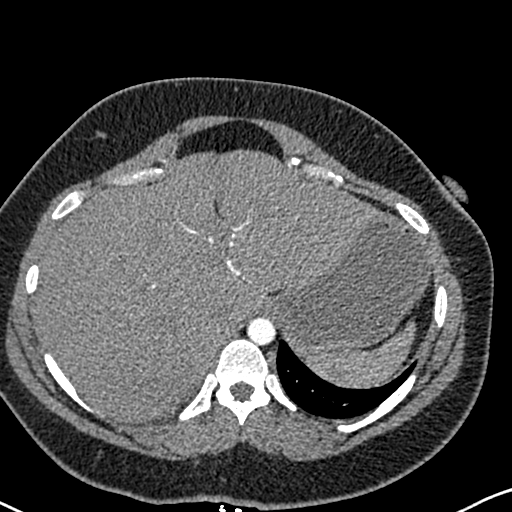
[im 39/225  lung]
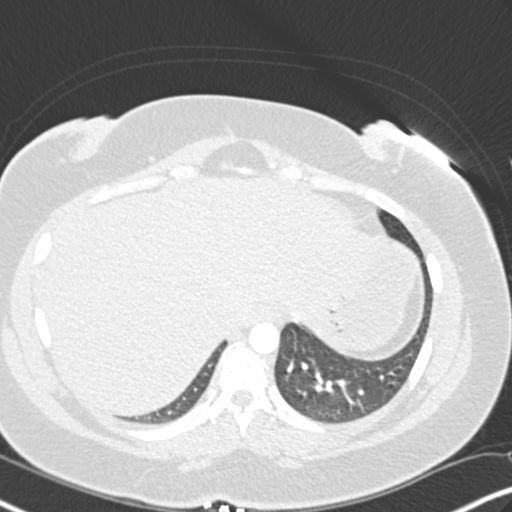
[im 49/225  soft-tissue]
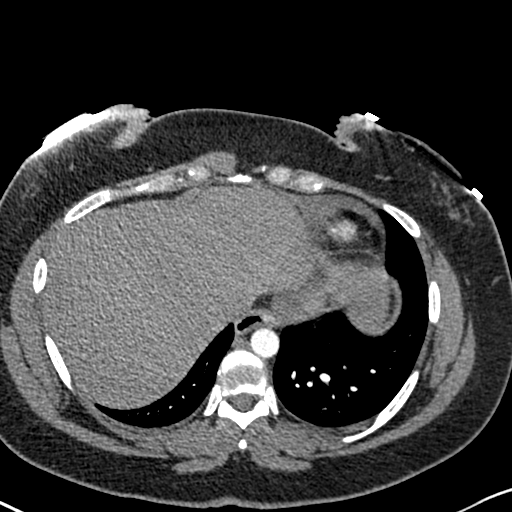
[im 59/225  lung]
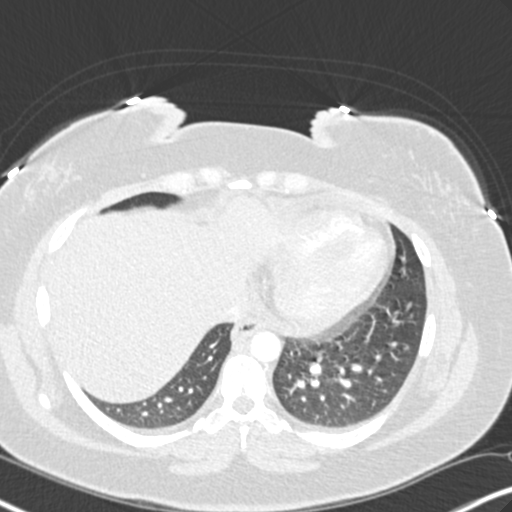
[im 78/225  soft-tissue]
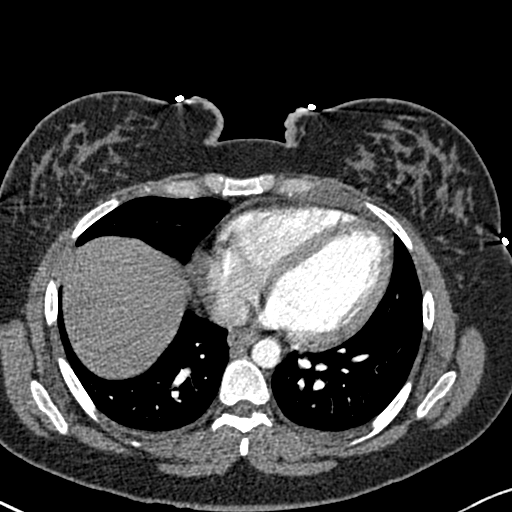
[im 88/225  lung]
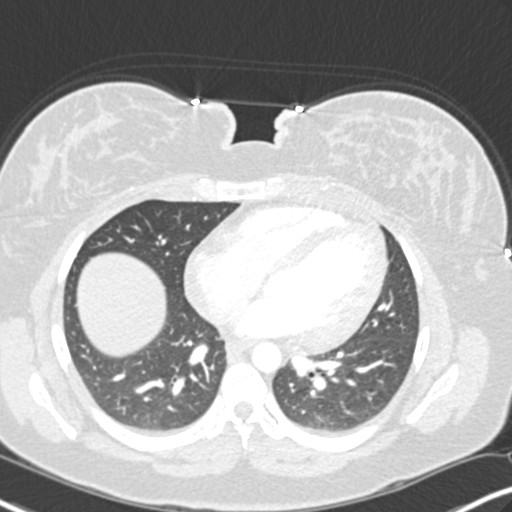
[im 98/225  soft-tissue]
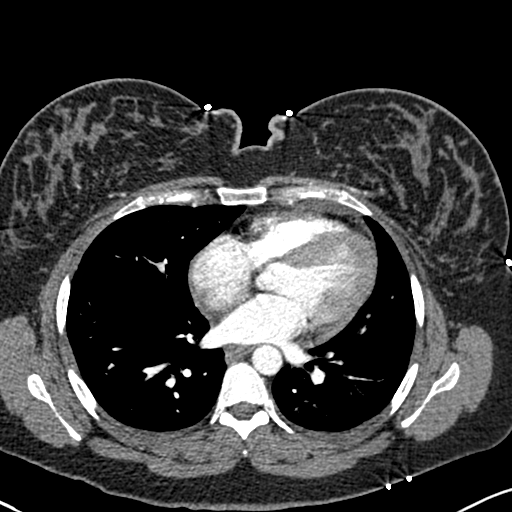
[im 117/225  lung]
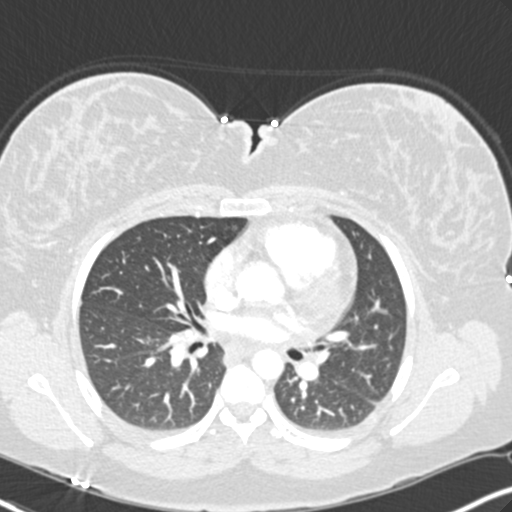
[im 127/225  soft-tissue]
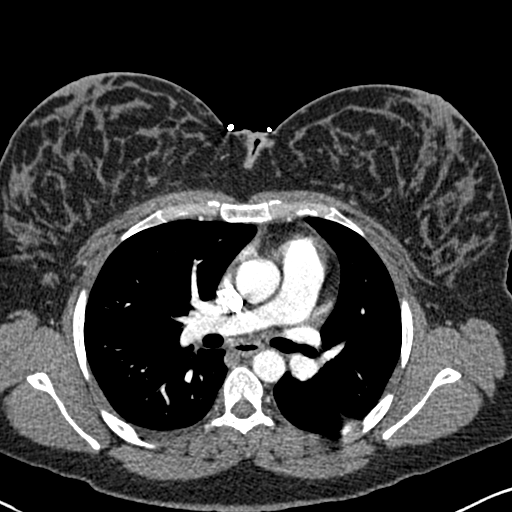
[im 137/225  lung]
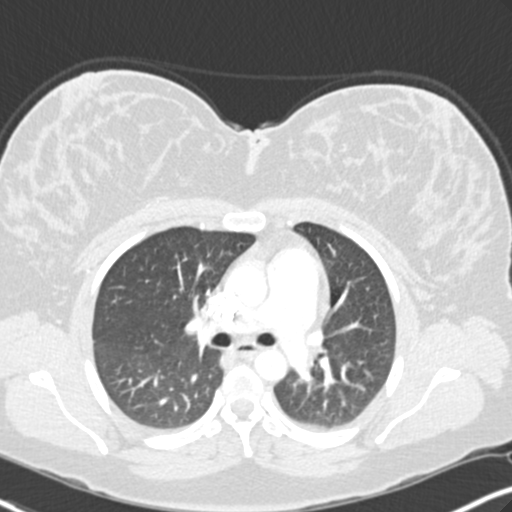
[im 147/225  soft-tissue]
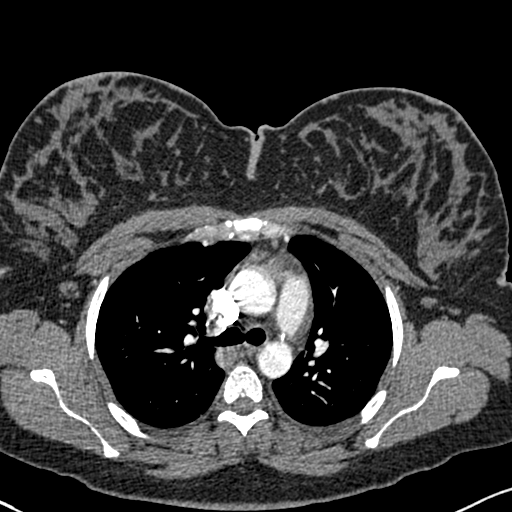
[im 166/225  lung]
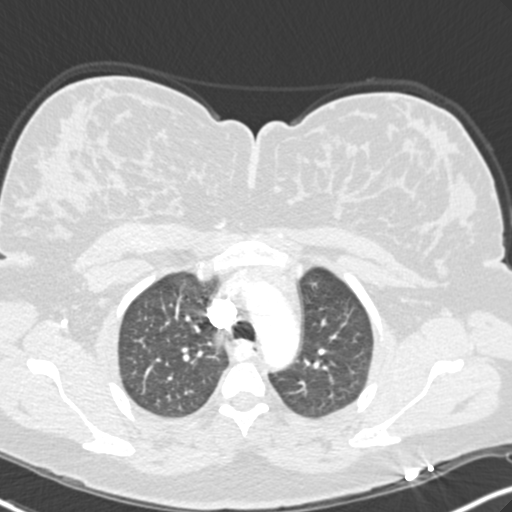
[im 176/225  soft-tissue]
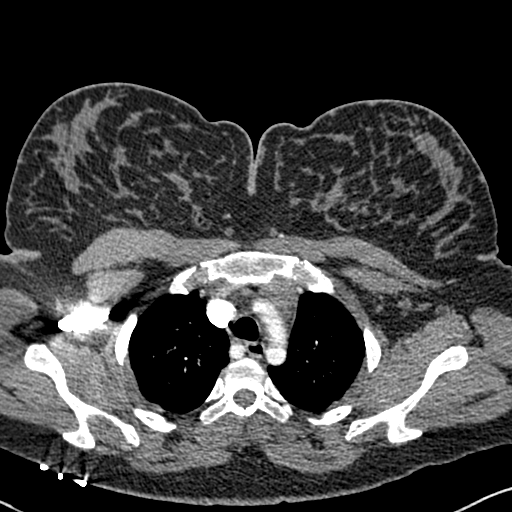
[im 186/225  lung]
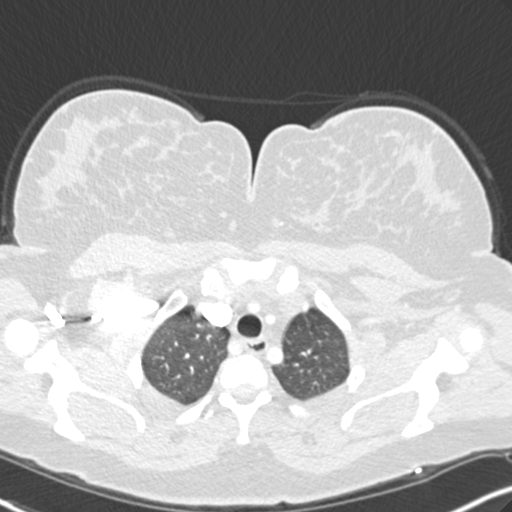
[im 205/225  soft-tissue]
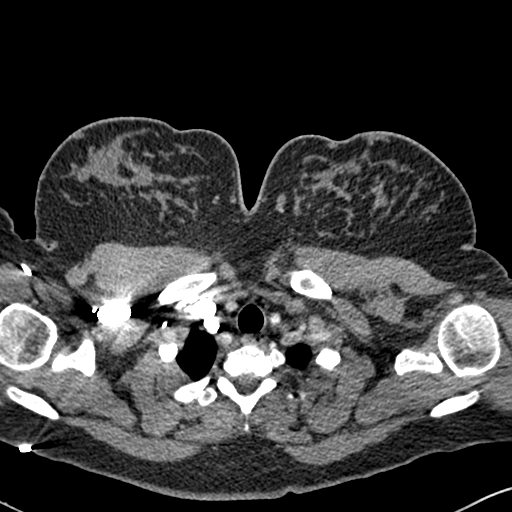
[im 215/225  lung]
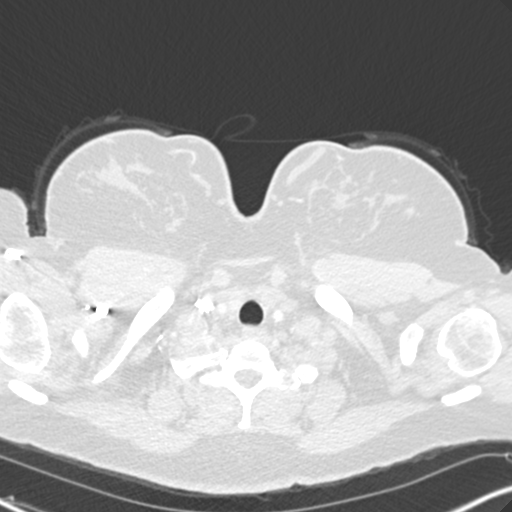

[Series 7: coronal mpr · coronal · 0.49mm/px · 2 of 83 slices shown]
[im 28/83  soft-tissue]
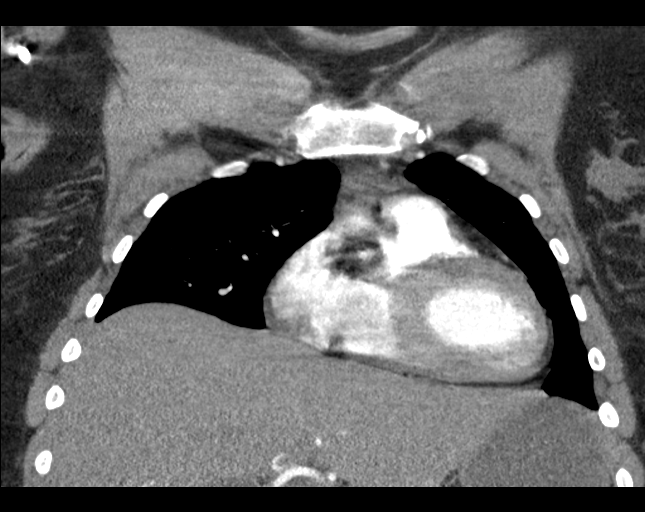
[im 55/83  soft-tissue]
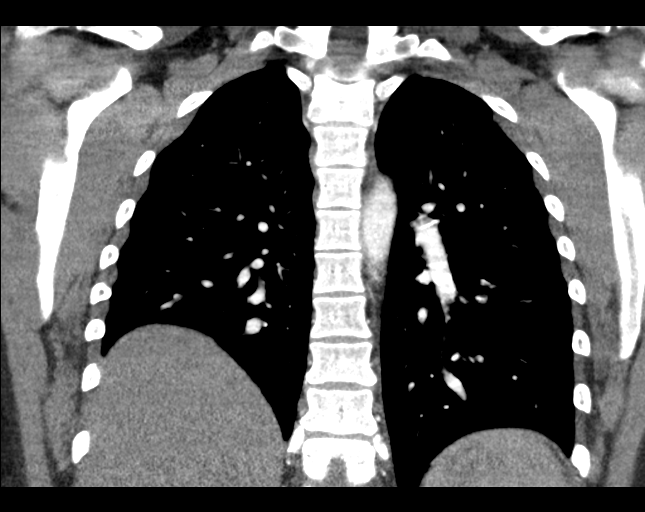

[19 of 46 positions shown; findings below may reference images not displayed]

FINDINGS: Cardiovascular: No filling defects in the pulmonary arteries to
suggest pulmonary emboli. Heart is normal size. Aorta is normal
caliber. Retroesophageal right subclavian artery noted.

Mediastinum/Nodes: No mediastinal, hilar, or axillary adenopathy.
Trachea and esophagus are unremarkable. Thyroid unremarkable.

Lungs/Pleura: Lungs are clear. No focal airspace opacities or
suspicious nodules. No effusions.

Upper Abdomen: Imaging into the upper abdomen shows no acute
findings.

Musculoskeletal: Chest wall soft tissues are unremarkable. No acute
bony abnormality.

Review of the MIP images confirms the above findings.
IMPRESSION: No evidence of pulmonary embolus.

No acute cardiopulmonary disease.

## 2021-06-14 ENCOUNTER — Other Ambulatory Visit: Payer: Self-pay

## 2021-07-09 ENCOUNTER — Other Ambulatory Visit: Payer: Self-pay

## 2021-07-30 ENCOUNTER — Other Ambulatory Visit: Payer: Self-pay

## 2021-08-01 ENCOUNTER — Telehealth (INDEPENDENT_AMBULATORY_CARE_PROVIDER_SITE_OTHER): Payer: No Typology Code available for payment source | Admitting: Family Medicine

## 2021-08-01 ENCOUNTER — Encounter: Payer: Self-pay | Admitting: Family Medicine

## 2021-08-01 ENCOUNTER — Other Ambulatory Visit: Payer: Self-pay

## 2021-08-01 VITALS — Wt 172.8 lb

## 2021-08-01 DIAGNOSIS — F339 Major depressive disorder, recurrent, unspecified: Secondary | ICD-10-CM | POA: Diagnosis not present

## 2021-08-01 DIAGNOSIS — Z8639 Personal history of other endocrine, nutritional and metabolic disease: Secondary | ICD-10-CM | POA: Diagnosis not present

## 2021-08-01 MED ORDER — FLUOXETINE HCL 20 MG PO TABS
ORAL_TABLET | ORAL | 1 refills | Status: DC
  Filled 2021-08-01: qty 180, fill #0
  Filled 2021-09-03 (×2): qty 180, 90d supply, fill #0
  Filled 2021-12-06 – 2021-12-28 (×2): qty 180, 90d supply, fill #1

## 2021-08-01 MED ORDER — SEMAGLUTIDE-WEIGHT MANAGEMENT 2.4 MG/0.75ML ~~LOC~~ SOAJ
2.4000 mg | SUBCUTANEOUS | 1 refills | Status: DC
Start: 2021-08-01 — End: 2022-03-11
  Filled 2021-08-01 – 2021-08-13 (×2): qty 9, 84d supply, fill #0
  Filled 2021-11-08: qty 9, 84d supply, fill #1
  Filled 2021-11-13: qty 3, 28d supply, fill #1
  Filled 2021-12-06 – 2021-12-28 (×2): qty 3, 28d supply, fill #2
  Filled 2022-01-30 – 2022-02-26 (×2): qty 3, 28d supply, fill #3

## 2021-08-01 NOTE — Assessment & Plan Note (Signed)
Tolerating her wegovy well. Feeling well. Continue current regimen. Continue to monitor. Call with any concerns.

## 2021-08-01 NOTE — Progress Notes (Signed)
Pt scheduled  

## 2021-08-01 NOTE — Assessment & Plan Note (Signed)
Under good control on current regimen. Continue current regimen. Continue to monitor. Call with any concerns. Refills given.   

## 2021-08-01 NOTE — Progress Notes (Signed)
Wt 172 lb 12.8 oz (78.4 kg)   BMI 32.65 kg/m    Subjective:    Patient ID: Breanna Lamb, female    DOB: 1990-06-08, 31 y.o.   MRN: [id number]  HPI: Breanna Lamb is a 31 y.o. female  Chief Complaint  Patient presents with   Obesity    Patient following up    Depression   OBESITY Duration: chronic Previous attempts at weight loss: yes Complications of obesity: depression Peak weight: 246 Weight loss goal: to maintain Weight loss to date: 72lbs (29%) Requesting obesity pharmacotherapy: yes Current weight loss supplements/medications: yes Previous weight loss supplements/meds: yes  DEPRESSION Mood status: controlled Satisfied with current treatment?: yes Symptom severity: mild  Duration of current treatment : chronic Side effects: no Medication compliance: excellent compliance Psychotherapy/counseling: no  Previous psychiatric medications: fluoxetine Depressed mood: no Anxious mood: no Anhedonia: no Significant weight loss or gain: yes Insomnia: no  Fatigue: no Feelings of worthlessness or guilt: no Impaired concentration/indecisiveness: no Suicidal ideations: no Hopelessness: no Crying spells: no    08/01/2021   10:56 AM 02/19/2021    4:20 PM 01/05/2021    8:29 AM 07/03/2020    1:21 PM 01/03/2020    4:19 PM  Depression screen PHQ 2/9  Decreased Interest '1 1 2 2 2  '$ Down, Depressed, Hopeless 0 '1 1 1 1  '$ PHQ - 2 Score '1 2 3 3 3  '$ Altered sleeping '1 2 2 2 1  '$ Tired, decreased energy '1 2 2 2 2  '$ Change in appetite 0 0 '3 1 3  '$ Feeling bad or failure about yourself  0 0 1 0 1  Trouble concentrating '1 1 1 1 1  '$ Moving slowly or fidgety/restless 0 0 0 0 1  Suicidal thoughts 0 0 0 0 0  PHQ-9 Score '4 7 12 9 12  '$ Difficult doing work/chores  Not difficult at all Somewhat difficult Not difficult at all Somewhat difficult    Relevant past medical, surgical, family and social history reviewed and updated as indicated. Interim medical history since our last visit  reviewed. Allergies and medications reviewed and updated.  Review of Systems  Constitutional: Negative.   Respiratory: Negative.    Cardiovascular: Negative.   Gastrointestinal: Negative.   Neurological: Negative.   Psychiatric/Behavioral: Negative.      Per HPI unless specifically indicated above     Objective:    Wt 172 lb 12.8 oz (78.4 kg)   BMI 32.65 kg/m   Wt Readings from Last 3 Encounters:  08/01/21 172 lb 12.8 oz (78.4 kg)  02/19/21 219 lb 6.4 oz (99.5 kg)  01/05/21 246 lb 6.4 oz (111.8 kg)    Physical Exam Vitals and nursing note reviewed.  Constitutional:      General: She is not in acute distress.    Appearance: Normal appearance. She is not ill-appearing, toxic-appearing or diaphoretic.  HENT:     Head: Normocephalic and atraumatic.     Right Ear: External ear normal.     Left Ear: External ear normal.     Nose: Nose normal.     Mouth/Throat:     Mouth: Mucous membranes are moist.     Pharynx: Oropharynx is clear.  Eyes:     General: No scleral icterus.       Right eye: No discharge.        Left eye: No discharge.     Conjunctiva/sclera: Conjunctivae normal.     Pupils: Pupils are equal, round, and reactive to  light.  Pulmonary:     Effort: Pulmonary effort is normal. No respiratory distress.     Comments: Speaking in full sentences Musculoskeletal:        General: Normal range of motion.     Cervical back: Normal range of motion.  Skin:    Coloration: Skin is not jaundiced or pale.     Findings: No bruising, erythema, lesion or rash.  Neurological:     Mental Status: She is alert and oriented to person, place, and time. Mental status is at baseline.  Psychiatric:        Mood and Affect: Mood normal.        Behavior: Behavior normal.        Thought Content: Thought content normal.        Judgment: Judgment normal.     Results for orders placed or performed during the hospital encounter of 12/20/20  Novel Coronavirus, NAA ([facility])    Specimen: Nasopharyngeal Swab; Nasopharyngeal(NP) swabs in vial transport medium   Nasopharynge  Patient  Result Value Ref Range   SARS-CoV-2, NAA Not Detected Not Detected  SARS-COV-2, NAA 2 DAY TAT   Nasopharynge  Patient  Result Value Ref Range   SARS-CoV-2, NAA 2 DAY TAT Performed   POCT Influenza A/B  Result Value Ref Range   Influenza A, POC Negative Negative   Influenza B, POC Negative Negative      Assessment & Plan:   Problem List Items Addressed This Visit       Other   Depression, recurrent ([place])    Under good control on current regimen. Continue current regimen. Continue to monitor. Call with any concerns. Refills given.        Relevant Medications   FLUoxetine (PROZAC) 20 MG tablet   History of morbid obesity - Primary    Tolerating her wegovy well. Feeling well. Continue current regimen. Continue to monitor. Call with any concerns.         Follow up plan: Return in about 6 months (around 02/01/2022) for physical .    This visit was completed via video visit through MyChart due to the restrictions of the COVID-19 pandemic. All issues as above were discussed and addressed. Physical exam was done as above through visual confirmation on video through MyChart. If it was felt that the patient should be evaluated in the office, they were directed there. The patient verbally consented to this visit. Location of the patient: home Location of the provider: work Those involved with this call:  Provider: Park Liter, DO CMA: Louanna Raw, [place] Desk/Registration: [employer]  Time spent on call:  25 minutes with patient face to face via video conference. More than 50% of this time was spent in counseling and coordination of care. 40 minutes total spent in review of patient's record and preparation of their chart.

## 2021-08-13 ENCOUNTER — Other Ambulatory Visit: Payer: Self-pay

## 2021-08-16 ENCOUNTER — Other Ambulatory Visit: Payer: Self-pay

## 2021-08-16 ENCOUNTER — Other Ambulatory Visit: Payer: Self-pay | Admitting: Pulmonary Disease

## 2021-08-16 MED ORDER — AZITHROMYCIN 250 MG PO TABS
ORAL_TABLET | ORAL | 0 refills | Status: AC
  Filled 2021-08-16: qty 6, 5d supply, fill #0

## 2021-08-16 MED ORDER — ALBUTEROL SULFATE HFA 108 (90 BASE) MCG/ACT IN AERS
2.0000 | INHALATION_SPRAY | Freq: Four times a day (QID) | RESPIRATORY_TRACT | 2 refills | Status: DC | PRN
  Filled 2021-08-16: qty 6.7, 25d supply, fill #0
  Filled 2022-04-04: qty 6.7, 25d supply, fill #1

## 2021-08-16 NOTE — Progress Notes (Signed)
Patient with acute tracheobronchitis and sputum color change, mild wheezing.  Will Rx with Azithromycin and as needed albuterol.  Breanna Don, MD Advanced Bronchoscopy PCCM [facility]

## 2021-09-03 ENCOUNTER — Other Ambulatory Visit: Payer: Self-pay

## 2021-09-04 ENCOUNTER — Other Ambulatory Visit: Payer: Self-pay

## 2021-10-02 ENCOUNTER — Telehealth: Payer: No Typology Code available for payment source | Admitting: Physician Assistant

## 2021-10-02 ENCOUNTER — Other Ambulatory Visit: Payer: Self-pay

## 2021-10-02 DIAGNOSIS — J029 Acute pharyngitis, unspecified: Secondary | ICD-10-CM | POA: Diagnosis not present

## 2021-10-02 MED ORDER — AMOXICILLIN 500 MG PO CAPS
500.0000 mg | ORAL_CAPSULE | Freq: Two times a day (BID) | ORAL | 0 refills | Status: AC
  Filled 2021-10-02: qty 20, 10d supply, fill #0

## 2021-10-02 NOTE — Patient Instructions (Signed)
Breanna Lamb, thank you for joining Leeanne Rio, PA-C for today's virtual visit.  While this provider is not your primary care provider (PCP), if your PCP is located in our provider database this encounter information will be shared with them immediately following your visit.  Consent: (Patient) Breanna Lamb provided verbal consent for this virtual visit at the beginning of the encounter.  Current Medications:  Current Outpatient Medications:    acetaminophen (TYLENOL) 325 MG tablet, Take 650 mg by mouth every 6 (six) hours as needed for moderate pain., Disp: , Rfl:    albuterol (VENTOLIN HFA) 108 (90 Base) MCG/ACT inhaler, Inhale 2 puffs into the lungs every 6 (six) hours as needed., Disp: 6.7 g, Rfl: 2   FLUoxetine (PROZAC) 20 MG tablet, Take 1 tab for 1-2 weeks, then increase to 2 tabs daily, Disp: 180 tablet, Rfl: 1   Insulin Pen Needle (PEN NEEDLES 31GX5/16") 31G X 8 MM MISC, use daily., Disp: 100 each, Rfl: 12   Semaglutide-Weight Management 2.4 MG/0.75ML SOAJ, Inject 2.4 mg into the skin once a week., Disp: 9 mL, Rfl: 1   Medications ordered in this encounter:  No orders of the defined types were placed in this encounter.    *If you need refills on other medications prior to your next appointment, please contact your pharmacy*  Follow-Up: Call back or seek an in-person evaluation if the symptoms worsen or if the condition fails to improve as anticipated.  [place] [phone]  Other Instructions UnbStrep Throat, Adult Strep throat is an infection of the throat. It is caused by germs (bacteria). Strep throat is common during the cold months of the year. It mostly affects children who are 80-46 years old. However, people of all ages can get it at any time of the year. This infection spreads from person to person through coughing, sneezing, or having close contact. What are the causes? This condition is caused by the Streptococcus pyogenes germ. What  increases the risk? You care for young children. Children are more likely to get strep throat and may spread it to others. You go to crowded places. Germs can spread easily in such places. You kiss or touch someone who has strep throat. What are the signs or symptoms? Fever or chills. Redness, swelling, or pain in the tonsils or throat. Pain or trouble when swallowing. White or yellow spots on the tonsils or throat. Tender glands in the neck and under the jaw. Bad breath. Red rash all over the body. This is rare. How is this treated? Medicines that kill germs (antibiotics). Medicines that treat pain or fever. These include: Ibuprofen or acetaminophen. Aspirin, only for people who are over the age of 36. Cough drops. Throat sprays. Follow these instructions at home: Medicines  Take over-the-counter and prescription medicines only as told by your doctor. Take your antibiotic medicine as told by your doctor. Do not stop taking the antibiotic even if you start to feel better. Eating and drinking  If you have trouble swallowing, eat soft foods until your throat feels better. Drink enough fluid to keep your pee (urine) pale yellow. To help with pain, you may have: Warm fluids, such as soup and tea. Cold fluids, such as frozen desserts or popsicles. General instructions Rinse your mouth (gargle) with a salt-water mixture 3-4 times a day or as needed. To make a salt-water mixture, dissolve -1 tsp (3-6 g) of salt in 1 cup (237 mL) of warm water. Rest as much  as you can. Stay home from work or school until you have been taking antibiotics for 24 hours. Do not smoke or use any products that contain nicotine or tobacco. If you need help quitting, ask your doctor. Keep all follow-up visits. How is this prevented?  Do not share food, drinking cups, or personal items. They can cause the germs to spread. Wash your hands well with soap and water. Make sure that all people in your house wash  their hands well. Have family members tested if they have a fever or a sore throat. They may need an antibiotic if they have strep throat. Contact a doctor if: You have swelling in your neck that keeps getting bigger. You get a rash, cough, or earache. You cough up a thick fluid that is green, yellow-brown, or bloody. You have pain that does not get better with medicine. Your symptoms get worse instead of getting better. You have a fever. Get help right away if: You vomit. You have a very bad headache. Your neck hurts or feels stiff. You have chest pain or are short of breath. You have drooling, very bad throat pain, or changes in your voice. Your neck is swollen, or the skin gets red and tender. Your mouth is dry, or you are peeing less than normal. You keep feeling more tired or have trouble waking up. Your joints are red or painful. These symptoms may be an emergency. Do not wait to see if the symptoms will go away. Get help right away. Call your local emergency services (911 in the [place] Summary Strep throat is an infection of the throat. It is caused by germs (bacteria). This infection can spread from person to person through coughing, sneezing, or having close contact. Take your medicines, including antibiotics, as told by your doctor. Do not stop taking the antibiotic even if you start to feel better. To prevent the spread of germs, wash your hands well with soap and water. Have others do the same. Do not share food, drinking cups, or personal items. Get help right away if you have a bad headache, chest pain, shortness of breath, a stiff or painful neck, or you vomit. This information is not intended to replace advice given to you by your health care provider. Make sure you discuss any questions you have with your health care provider. Document Revised: 04/18/2020 Document Reviewed: 04/18/2020 Elsevier Patient Education  [place].    If you have been instructed to  have an in-person evaluation today at a local Urgent Care facility, please use the link below. It will take you to a list of all of our available [facility], including address, phone number and hours of operation. Please do not delay care.  [facility]  If you or a family member do not have a primary care provider, use the link below to schedule a visit and establish care. When you choose a [facility] primary care physician or advanced practice provider, you gain a long-term partner in health. Find a Primary Care Provider  Learn more about [facility]'s in-office and virtual care options: [facility]

## 2021-10-02 NOTE — Progress Notes (Signed)
Virtual Visit Consent   Breanna Lamb, you are scheduled for a virtual visit with a [facility] provider today. Just as with appointments in the office, your consent must be obtained to participate. Your consent will be active for this visit and any virtual visit you may have with one of our providers in the next 365 days. If you have a MyChart account, a copy of this consent can be sent to you electronically.  As this is a virtual visit, video technology does not allow for your provider to perform a traditional examination. This may limit your provider's ability to fully assess your condition. If your provider identifies any concerns that need to be evaluated in person or the need to arrange testing (such as labs, EKG, etc.), we will make arrangements to do so. Although advances in technology are sophisticated, we cannot ensure that it will always work on either your end or our end. If the connection with a video visit is poor, the visit may have to be switched to a telephone visit. With either a video or telephone visit, we are not always able to ensure that we have a secure connection.  By engaging in this virtual visit, you consent to the provision of healthcare and authorize for your insurance to be billed (if applicable) for the services provided during this visit. Depending on your insurance coverage, you may receive a charge related to this service.  I need to obtain your verbal consent now. Are you willing to proceed with your visit today? Breanna Lamb has provided verbal consent on 10/02/2021 for a virtual visit (video or telephone). Leeanne Rio, [place]  Date: 10/02/2021 5:39 PM  Virtual Visit via Video Note   I, Leeanne Rio, connected with  Breanna Lamb  ([id number], 26-Apr-1990) on 10/02/21 at  5:30 PM EDT by a video-enabled telemedicine application and verified that I am speaking with the correct person using two identifiers.  Location: Patient: Virtual Visit Location  Patient: Mobile Provider: Virtual Visit Location Provider: Home Office   I discussed the limitations of evaluation and management by telemedicine and the availability of in person appointments. The patient expressed understanding and agreed to proceed.    History of Present Illness: Breanna Lamb is a 31 y.o. who identifies as a female who was assigned female at birth, and is being seen today for possible strep throat. Notes one of her children is currently undergoing treatment for strep. Notes her symptoms started last night with scratchy throat that has worsened. Now with tonsillar swelling and white patches. Denies fever as of yet. Denies any URI symptoms.Marland Kitchen     HPI: HPI  Problems:  Patient Active Problem List   Diagnosis Date Noted   Depression, recurrent ([place]) 01/03/2020   History of morbid obesity 01/03/2020   Chronic tension-type headache, not intractable 01/31/2016    Allergies:  Allergies  Allergen Reactions   Nickel Rash   Medications:  Current Outpatient Medications:    amoxicillin (AMOXIL) 500 MG capsule, Take 1 capsule (500 mg total) by mouth 2 (two) times daily for 10 days., Disp: 20 capsule, Rfl: 0   acetaminophen (TYLENOL) 325 MG tablet, Take 650 mg by mouth every 6 (six) hours as needed for moderate pain., Disp: , Rfl:    albuterol (VENTOLIN HFA) 108 (90 Base) MCG/ACT inhaler, Inhale 2 puffs into the lungs every 6 (six) hours as needed., Disp: 6.7 g, Rfl: 2   FLUoxetine (PROZAC) 20 MG tablet, Take 1 tab for  1-2 weeks, then increase to 2 tabs daily, Disp: 180 tablet, Rfl: 1   Insulin Pen Needle (PEN NEEDLES 31GX5/16") 31G X 8 MM MISC, use daily., Disp: 100 each, Rfl: 12   Semaglutide-Weight Management 2.4 MG/0.75ML SOAJ, Inject 2.4 mg into the skin once a week., Disp: 9 mL, Rfl: 1  Observations/Objective: Patient is well-developed, well-nourished in no acute distress.  Resting comfortably at home.  Head is normocephalic, atraumatic.  No labored breathing. Speech  is clear and coherent with logical content.  Patient is alert and oriented at baseline.   Assessment and Plan: 1. Exudative pharyngitis - amoxicillin (AMOXIL) 500 MG capsule; Take 1 capsule (500 mg total) by mouth 2 (two) times daily for 10 days.  Dispense: 20 capsule; Refill: 0  Known exposure. Classic symptoms minus fever but early in course of symptoms. Will start Amox BID x 10 days. Supportive measures, OTC medications and quarantine reviewed. Work note declined.   Follow Up Instructions: I discussed the assessment and treatment plan with the patient. The patient was provided an opportunity to ask questions and all were answered. The patient agreed with the plan and demonstrated an understanding of the instructions.  A copy of instructions were sent to the patient via MyChart unless otherwise noted below.   The patient was advised to call back or seek an in-person evaluation if the symptoms worsen or if the condition fails to improve as anticipated.  Time:  I spent 10 minutes with the patient via telehealth technology discussing the above problems/concerns.    Leeanne Rio, PA-C

## 2021-10-03 ENCOUNTER — Other Ambulatory Visit: Payer: Self-pay

## 2021-11-08 ENCOUNTER — Other Ambulatory Visit: Payer: Self-pay

## 2021-11-12 ENCOUNTER — Telehealth: Payer: Self-pay

## 2021-11-12 NOTE — Telephone Encounter (Signed)
PA initiated via CoverMyMeds for [facility] 2.'4MG'$   Medication has been approved.

## 2021-11-13 ENCOUNTER — Other Ambulatory Visit: Payer: Self-pay

## 2021-12-06 ENCOUNTER — Other Ambulatory Visit: Payer: Self-pay

## 2021-12-06 ENCOUNTER — Telehealth: Payer: Self-pay

## 2021-12-06 NOTE — Telephone Encounter (Signed)
This pt is calling requesting an appt for possible ovarian cyst. Advised her I would have to send to the FD to see if there were any available appts. Please help schedule patient. Not sure why we keep getting calls on triage for patients wanting to schedule appts

## 2021-12-07 ENCOUNTER — Other Ambulatory Visit: Payer: Self-pay

## 2021-12-10 ENCOUNTER — Telehealth: Payer: Self-pay | Admitting: Obstetrics and Gynecology

## 2021-12-10 NOTE — Telephone Encounter (Signed)
Reached out to pt to schedule with Dr. Amalia Hailey for a possible ovarian cyst.  Left message for pt to call back.

## 2021-12-18 ENCOUNTER — Other Ambulatory Visit: Payer: Self-pay

## 2021-12-20 ENCOUNTER — Ambulatory Visit
Admission: EM | Admit: 2021-12-20 | Discharge: 2021-12-20 | Disposition: A | Discharge status: Home / Self Care | Payer: No Typology Code available for payment source | Attending: Urgent Care | Admitting: Urgent Care

## 2021-12-20 DIAGNOSIS — U071 COVID-19: Secondary | ICD-10-CM | POA: Diagnosis not present

## 2021-12-20 DIAGNOSIS — R6889 Other general symptoms and signs: Secondary | ICD-10-CM

## 2021-12-20 LAB — RESP PANEL BY RT-PCR (RSV, FLU A&B, COVID)  RVPGX2
Influenza A by PCR: NEGATIVE
Influenza B by PCR: NEGATIVE
Resp Syncytial Virus by PCR: NEGATIVE
SARS Coronavirus 2 by RT PCR: POSITIVE — AB

## 2021-12-20 MED ORDER — OSELTAMIVIR PHOSPHATE 75 MG PO CAPS: 75.0000 mg | ORAL_CAPSULE | Freq: Two times a day (BID) | ORAL | 0 refills | Status: DC

## 2021-12-20 NOTE — ED Provider Notes (Signed)
[facility]    CSN: [id number] Arrival date & time: 12/20/21  3151      History   Chief Complaint No chief complaint on file.   HPI Breanna Lamb is a 31 y.o. female.   HPI  Presents with flulike symptoms starting last night.  Symptoms include body aches, chills, feeling of "tightness" with breathing, headache.  She denies documented fever.  Denies nausea, vomiting, diarrhea.  Patient works at [employer] and is concerned about possibility of RSV given her contact with patients with chronic respiratory illness.  She also endorses asthmatic child at home.  PMH with asthma  Past Medical History:  Diagnosis Date   Allergic rhinitis    Asthma    childhood   Benign positional vertigo    Bunion    Contraceptive management    Depression    Genital herpes    Genital herpes simplex virus (HSV) infection in mother affecting pregnancy 07/29/2018   Henoch-Schonlein purpura ([place])    Low-lying placenta 11/19/2018   Resolved 23 week anatomy follow up   Lymphadenopathy    Neck pain    Obesity    Ovarian cyst, right    Poor concentration    Premenstrual dysphoric syndrome    Premenstrual dysphoric syndrome    Screening for venereal disease    Skin lesion     Patient Active Problem List   Diagnosis Date Noted   Depression, recurrent ([place]) 01/03/2020   History of morbid obesity 01/03/2020   Chronic tension-type headache, not intractable 01/31/2016    Past Surgical History:  Procedure Laterality Date   BUNIONECTOMY  2010 and 2011   [place]  04/08/2019   Procedure: CESAREAN SECTION WITH BILATERAL TUBAL LIGATION;  Surgeon: [name], MD;  Location: [facility];  Service: Obstetrics;;   mirena IUD  01/2014   OVARIAN CYST REMOVAL  August 2014   TUBAL LIGATION  04/2019   WISDOM TOOTH EXTRACTION  2014    OB History     Gravida  3   Para  3   Term  3   Preterm      AB      Living  3       SAB      IAB      Ectopic      Multiple  0   Live Births  3            Home Medications    Prior to Admission medications   Medication Sig Start Date End Date Taking? Authorizing Provider  acetaminophen (TYLENOL) 325 MG tablet Take 650 mg by mouth every 6 (six) hours as needed for moderate pain.   Yes [provider]  albuterol (VENTOLIN HFA) 108 (90 Base) MCG/ACT inhaler Inhale 2 puffs into the lungs every 6 (six) hours as needed. 08/16/21  Yes Tyler Pita, MD  FLUoxetine (PROZAC) 20 MG tablet Take 1 tab for 1-2 weeks, then increase to 2 tabs daily 08/01/21  Yes Johnson, Megan P, DO  Insulin Pen Needle (PEN NEEDLES 31GX5/16") 31G X 8 MM MISC use daily. 01/05/21  Yes [name], DO    Family History Family History  Problem Relation Age of Onset   Hypertension Mother    Mental illness Mother        Depression, Anxiety   Hypertension Father    Hypertension Brother    Hypertension Maternal Grandmother    Diabetes Maternal  Grandmother    Hyperlipidemia Maternal Grandmother    Heart disease Maternal Grandmother    COPD Maternal Grandmother    Kidney disease Maternal Grandmother    Mental illness Maternal Grandmother    Hypertension Sister    Mental illness Sister    Heart disease Maternal Grandfather    Heart attack Maternal Grandfather    Cancer Paternal Grandmother     Social History Social History   Tobacco Use   Smoking status: Never   Smokeless tobacco: Never  Vaping Use   Vaping Use: Never used  Substance Use Topics   Alcohol use: No   Drug use: No     Allergies   Nickel   Review of Systems Review of Systems   Physical Exam Triage Vital Signs ED Triage Vitals  Enc Vitals Group     BP 12/20/21 1015 123/76     Pulse Rate 12/20/21 1015 75     Resp 12/20/21 1015 18     Temp 12/20/21 1015 97.6 F (36.4 C)     Temp Source 12/20/21 1015 Oral     SpO2 12/20/21 1015 99 %     Weight --      Height --      Head  Circumference --      Peak Flow --      Pain Score 12/20/21 1008 4     Pain Loc --      Pain Edu? --      Excl. in [place]? --    No data found.  Updated Vital Signs BP 123/76 (BP Location: Left Arm)   Pulse 75   Temp 97.6 F (36.4 C) (Oral)   Resp 18   LMP 12/20/2021 (Exact Date)   SpO2 99%   Visual Acuity Right Eye Distance:   Left Eye Distance:   Bilateral Distance:    Right Eye Near:   Left Eye Near:    Bilateral Near:     Physical Exam Vitals reviewed.  Constitutional:      Appearance: She is ill-appearing.  Cardiovascular:     Rate and Rhythm: Normal rate and regular rhythm.     Pulses: Normal pulses.     Heart sounds: Normal heart sounds.  Pulmonary:     Effort: Pulmonary effort is normal.     Breath sounds: Normal breath sounds.  Skin:    General: Skin is warm and dry.  Neurological:     General: No focal deficit present.     Mental Status: She is alert and oriented to person, place, and time.  Psychiatric:        Mood and Affect: Mood normal.        Behavior: Behavior normal.      UC Treatments / Results  Labs (all labs ordered are listed, but only abnormal results are displayed) Labs Reviewed - No data to display  EKG   Radiology No results found.  Procedures Procedures (including critical care time)  Medications Ordered in UC Medications - No data to display  Initial Impression / Assessment and Plan / UC Course  I have reviewed the triage vital signs and the nursing notes.  Pertinent labs & imaging results that were available during my care of the patient were reviewed by me and considered in my medical decision making (see chart for details).   Patient is afebrile here without recent antipyretics. Satting well on room air. Overall is ill appearing, though well hydrated, without respiratory distress. Pulmonary exam is unremarkable.  Lungs  CTAB without wheezes, rhonchi or rales.  Suspect viral process including flu, COVID, RSV.  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  Treating presumptively for influenza with Tamiflu while she is within the treatment window.  She will use OTC medication for control of her other symptoms.  Final Clinical Impressions(s) / UC Diagnoses   Final diagnoses:  None   Discharge Instructions   None    ED Prescriptions   None    PDMP not reviewed this encounter.   Rose Phi, [place] 12/20/21 1032

## 2021-12-20 NOTE — Discharge Instructions (Signed)
ou have been diagnosed with a viral upper respiratory infection based on your symptoms and exam. Viral illnesses cannot be treated with antibiotics - they are self limiting - and you should find your symptoms resolving within a few days. Get plenty of rest and non-caffeinated fluids.  We have performed a respiratory swab checking for COVID, and influenza, and RSV.  You are being treated presumptively with antiviral therapy for influenza A.  You will be contacted after the results of your testing are available with instructions to continue or discontinue this medication.    We recommend you use over-the-counter medications for symptom control including Tylenol or ibuprofen for fever, chills or body aches, and cold/cough medication.  Saline mist spray is helpful for removing excess mucus from your nose.  Room humidifiers are helpful to ease breathing at night. You might also find relief of nasal/sinus congestion symptoms by using a nasal decongestant such as Sudafed sinus (pseudoephedrine).  You will need to obtain this medication from behind the pharmacist counter.  Speak to the pharmacist to verify that you are not duplicating medications with other over-the-counter formulations that you may be using.   Follow up here or with your primary care provider if your symptoms are worsening or not improving.

## 2021-12-20 NOTE — ED Triage Notes (Signed)
Pt reports s/s starting last night - body aches, chills, tightness with breathing, HA.  No fever. Works at [employer].  Requesting COVID/Flu as well as RSV d/t having asthmatic child at home.

## 2021-12-21 ENCOUNTER — Other Ambulatory Visit: Payer: Self-pay | Admitting: *Deleted

## 2021-12-21 MED ORDER — MOLNUPIRAVIR EUA 200MG CAPSULE: 4.0000 | ORAL_CAPSULE | Freq: Two times a day (BID) | ORAL | 0 refills | Status: AC

## 2021-12-21 MED ORDER — AIRSUPRA 90-80 MCG/ACT IN AERO: 2.0000 | INHALATION_SPRAY | RESPIRATORY_TRACT | 0 refills | Status: AC | PRN

## 2021-12-21 MED ORDER — AIRSUPRA 90-80 MCG/ACT IN AERO: 2.0000 | INHALATION_SPRAY | RESPIRATORY_TRACT | 0 refills | Status: DC | PRN

## 2021-12-28 ENCOUNTER — Other Ambulatory Visit: Payer: Self-pay

## 2022-01-14 ENCOUNTER — Other Ambulatory Visit: Payer: Self-pay

## 2022-01-15 ENCOUNTER — Telehealth: Payer: 59 | Admitting: Family Medicine

## 2022-01-15 DIAGNOSIS — Z20828 Contact with and (suspected) exposure to other viral communicable diseases: Secondary | ICD-10-CM | POA: Diagnosis not present

## 2022-01-15 MED ORDER — OSELTAMIVIR PHOSPHATE 75 MG PO CAPS: 75.0000 mg | ORAL_CAPSULE | Freq: Two times a day (BID) | ORAL | 0 refills | Status: AC

## 2022-01-15 NOTE — Progress Notes (Signed)
Virtual Visit Consent   [name], you are scheduled for a virtual visit with a [facility] provider today. Just as with appointments in the office, your consent must be obtained to participate. Your consent will be active for this visit and any virtual visit you may have with one of our providers in the next 365 days. If you have a MyChart account, a copy of this consent can be sent to you electronically.  As this is a virtual visit, video technology does not allow for your provider to perform a traditional examination. This may limit your provider's ability to fully assess your condition. If your provider identifies any concerns that need to be evaluated in person or the need to arrange testing (such as labs, EKG, etc.), we will make arrangements to do so. Although advances in technology are sophisticated, we cannot ensure that it will always work on either your end or our end. If the connection with a video visit is poor, the visit may have to be switched to a telephone visit. With either a video or telephone visit, we are not always able to ensure that we have a secure connection.  By engaging in this virtual visit, you consent to the provision of healthcare and authorize for your insurance to be billed (if applicable) for the services provided during this visit. Depending on your insurance coverage, you may receive a charge related to this service.  I need to obtain your verbal consent now. Are you willing to proceed with your visit today? Breanna Lamb has provided verbal consent on 01/15/2022 for a virtual visit (video or telephone). Perlie Mayo, NP  Date: 01/15/2022 3:49 PM  Virtual Visit via Video Note   I, Perlie Mayo, connected with  Breanna Lamb  ([id number], 1990/12/14) on 01/15/22 at  4:00 PM EST by a video-enabled telemedicine application and verified that I am speaking with the correct person using two identifiers.  Location: Patient: Virtual Visit Location Patient:  Home Provider: Virtual Visit Location Provider: Home Office   I discussed the limitations of evaluation and management by telemedicine and the availability of in person appointments. The patient expressed understanding and agreed to proceed.    History of Present Illness: Breanna Lamb is a 32 y.o. who identifies as a female who was assigned female at birth, and is being seen today for Flu- known exposure with daughter has flu B Diagnoses yesterday. Onset of pt nasal congestion was today. Denies other flu like symptoms- no fevers, chills, chest pain, shortness of breath.  History of asthma- not flared at this time.  Did have covid back in Dec was treated and doing well after that. Desires Tamiflu given asthma history.   Problems:  Patient Active Problem List   Diagnosis Date Noted   Depression, recurrent ([place]) 01/03/2020   History of morbid obesity 01/03/2020   Chronic tension-type headache, not intractable 01/31/2016    Allergies:  Allergies  Allergen Reactions   Nickel Rash   Medications:  Current Outpatient Medications:    acetaminophen (TYLENOL) 325 MG tablet, Take 650 mg by mouth every 6 (six) hours as needed for moderate pain., Disp: , Rfl:    albuterol (VENTOLIN HFA) 108 (90 Base) MCG/ACT inhaler, Inhale 2 puffs into the lungs every 6 (six) hours as needed., Disp: 6.7 g, Rfl: 2   Albuterol-Budesonide (AIRSUPRA) 90-80 MCG/ACT AERO, Inhale 2 puffs into the lungs every 4 (four) hours as needed., Disp: 5.9 g, Rfl: 0   FLUoxetine (  PROZAC) 20 MG tablet, Take 1 tab for 1-2 weeks, then increase to 2 tabs daily, Disp: 180 tablet, Rfl: 1   Insulin Pen Needle (PEN NEEDLES 31GX5/16") 31G X 8 MM MISC, use daily., Disp: 100 each, Rfl: 12   oseltamivir (TAMIFLU) 75 MG capsule, Take 1 capsule (75 mg total) by mouth every 12 (twelve) hours., Disp: 10 capsule, Rfl: 0   Semaglutide-Weight Management 2.4 MG/0.75ML SOAJ, Inject 2.4 mg into the skin once a week., Disp: 9 mL, Rfl:  1  Observations/Objective: Patient is well-developed, well-nourished in no acute distress.  Resting comfortably  at home.  Head is normocephalic, atraumatic.  No labored breathing.  Speech is clear and coherent with logical content.  Patient is alert and oriented at baseline.    Assessment and Plan:  1. Exposure to the flu  - oseltamivir (TAMIFLU) 75 MG capsule; Take 1 capsule (75 mg total) by mouth 2 (two) times daily for 5 days.  Dispense: 10 capsule; Refill: 0   - Continue OTC symptomatic management of choice - Will send OTC vitamins and supplement information through AVS - Take as prescribed - Patient enrolled in MyChart symptom monitoring - Push fluids - Rest as needed - Discussed return precautions and when to seek in-person evaluation, sent via AVS as well   Reviewed side effects, risks and benefits of medication.    Patient acknowledged agreement and understanding of the plan.   Past Medical, Surgical, Social History, Allergies, and Medications have been Reviewed.   Follow Up Instructions: I discussed the assessment and treatment plan with the patient. The patient was provided an opportunity to ask questions and all were answered. The patient agreed with the plan and demonstrated an understanding of the instructions.  A copy of instructions were sent to the patient via MyChart unless otherwise noted below.    The patient was advised to call back or seek an in-person evaluation if the symptoms worsen or if the condition fails to improve as anticipated.  Time:  I spent 10 minutes with the patient via telehealth technology discussing the above problems/concerns.    Perlie Mayo, NP

## 2022-01-15 NOTE — Patient Instructions (Signed)
Breanna Lamb, thank you for joining Perlie Mayo, NP for today's virtual visit.  While this provider is not your primary care provider (PCP), if your PCP is located in our provider database this encounter information will be shared with them immediately following your visit.   New [place] account gives you access to today's visit and all your visits, tests, and labs performed at [facility] " click here if you don't have a [facility] account or go to [email or web address]  Consent: (Patient) Breanna Lamb provided verbal consent for this virtual visit at the beginning of the encounter.  Current Medications:  Current Outpatient Medications:    oseltamivir (TAMIFLU) 75 MG capsule, Take 1 capsule (75 mg total) by mouth 2 (two) times daily for 5 days., Disp: 10 capsule, Rfl: 0   acetaminophen (TYLENOL) 325 MG tablet, Take 650 mg by mouth every 6 (six) hours as needed for moderate pain., Disp: , Rfl:    albuterol (VENTOLIN HFA) 108 (90 Base) MCG/ACT inhaler, Inhale 2 puffs into the lungs every 6 (six) hours as needed., Disp: 6.7 g, Rfl: 2   Albuterol-Budesonide (AIRSUPRA) 90-80 MCG/ACT AERO, Inhale 2 puffs into the lungs every 4 (four) hours as needed., Disp: 5.9 g, Rfl: 0   FLUoxetine (PROZAC) 20 MG tablet, Take 1 tab for 1-2 weeks, then increase to 2 tabs daily, Disp: 180 tablet, Rfl: 1   Insulin Pen Needle (PEN NEEDLES 31GX5/16") 31G X 8 MM MISC, use daily., Disp: 100 each, Rfl: 12   Semaglutide-Weight Management 2.4 MG/0.75ML SOAJ, Inject 2.4 mg into the skin once a week., Disp: 9 mL, Rfl: 1   Medications ordered in this encounter:  Meds ordered this encounter  Medications   oseltamivir (TAMIFLU) 75 MG capsule    Sig: Take 1 capsule (75 mg total) by mouth 2 (two) times daily for 5 days.    Dispense:  10 capsule    Refill:  0    Order Specific Question:   Supervising Provider    Answer:   Chase Picket [id number]     *If you need refills on  other medications prior to your next appointment, please contact your pharmacy*  Follow-Up: Call back or seek an in-person evaluation if the symptoms worsen or if the condition fails to improve as anticipated.  [place] [phone]  Other Instructions  Influenza, Adult Influenza, also called "the flu," is a viral infection that mainly affects the respiratory tract. This includes the lungs, nose, and throat. The flu spreads easily from person to person (is contagious). It causes common cold symptoms, along with high fever and body aches. What are the causes? This condition is caused by the influenza virus. You can get the virus by: Breathing in droplets that are in the air from an infected person's cough or sneeze. Touching something that has the virus on it (has been contaminated) and then touching your mouth, nose, or eyes. What increases the risk? The following factors may make you more likely to get the flu: Not washing or sanitizing your hands often. Having close contact with many people during cold and flu season. Touching your mouth, eyes, or nose without first washing or sanitizing your hands. Not getting an annual flu shot. You may have a higher risk for the flu, including serious problems, such as a lung infection (pneumonia), if you: Are older than 65. Are pregnant. Have a weakened disease-fighting system (immune system). This includes people who have HIV or  AIDS, are on chemotherapy, or are taking medicines that reduce (suppress) the immune system. Have a long-term (chronic) illness, such as heart disease, kidney disease, diabetes, or lung disease. Have a liver disorder. Are severely overweight (morbidly obese). Have anemia. Have asthma. What are the signs or symptoms? Symptoms of this condition usually begin suddenly and last 4-14 days. These may include: Fever and chills. Headaches, body aches, or muscle aches. Sore throat. Cough. Runny or stuffy  (congested) nose. Chest discomfort. Poor appetite. Weakness or fatigue. Dizziness. Nausea or vomiting. How is this diagnosed? This condition may be diagnosed based on: Your symptoms and medical history. A physical exam. Swabbing your nose or throat and testing the fluid for the influenza virus. How is this treated? If the flu is diagnosed early, you can be treated with antiviral medicine that is given by mouth (orally) or through an IV. This can help reduce how severe the illness is and how long it lasts. Taking care of yourself at home can help relieve symptoms. Your health care provider may recommend: Taking over-the-counter medicines. Drinking plenty of fluids. In many cases, the flu goes away on its own. If you have severe symptoms or complications, you may be treated in a hospital. Follow these instructions at home: Activity Rest as needed and get plenty of sleep. Stay home from work or school as told by your health care provider. Unless you are visiting your health care provider, avoid leaving home until your fever has been gone for 24 hours without taking medicine. Eating and drinking Take an oral rehydration solution (ORS). This is a drink that is sold at pharmacies and retail stores. Drink enough fluid to keep your urine pale yellow. Drink clear fluids in small amounts as you are able. Clear fluids include water, ice chips, fruit juice mixed with water, and low-calorie sports drinks. Eat bland, easy-to-digest foods in small amounts as you are able. These foods include bananas, applesauce, rice, lean meats, toast, and crackers. Avoid drinking fluids that contain a lot of sugar or caffeine, such as energy drinks, regular sports drinks, and soda. Avoid alcohol. Avoid spicy or fatty foods. General instructions     Take over-the-counter and prescription medicines only as told by your health care provider. Use a cool mist humidifier to add humidity to the air in your home. This  can make it easier to breathe. When using a cool mist humidifier, clean it daily. Empty the water and replace it with clean water. Cover your mouth and nose when you cough or sneeze. Wash your hands with soap and water often and for at least 20 seconds, especially after you cough or sneeze. If soap and water are not available, use alcohol-based hand sanitizer. Keep all follow-up visits. This is important. How is this prevented?  Get an annual flu shot. This is usually available in late summer, fall, or winter. Ask your health care provider when you should get your flu shot. Avoid contact with people who are sick during cold and flu season. This is generally fall and winter. Contact a health care provider if: You develop new symptoms. You have: Chest pain. Diarrhea. A fever. Your cough gets worse. You produce more mucus. You feel nauseous or you vomit. Get help right away if you: Develop shortness of breath or have difficulty breathing. Have skin or nails that turn a bluish color. Have severe pain or stiffness in your neck. Develop a sudden headache or sudden pain in your face or ear. Cannot eat or drink  without vomiting. These symptoms may represent a serious problem that is an emergency. Do not wait to see if the symptoms will go away. Get medical help right away. Call your local emergency services (911 in the [place] Do not drive yourself to the hospital. Summary Influenza, also called "the flu," is a viral infection that primarily affects your respiratory tract. Symptoms of the flu usually begin suddenly and last 4-14 days. Getting an annual flu shot is the best way to prevent getting the flu. Stay home from work or school as told by your health care provider. Unless you are visiting your health care provider, avoid leaving home until your fever has been gone for 24 hours without taking medicine. Keep all follow-up visits. This is important. This information is not intended to  replace advice given to you by your health care provider. Make sure you discuss any questions you have with your health care provider. Document Revised: 08/13/2019 Document Reviewed: 08/13/2019 Elsevier Patient Education  [place].    If you have been instructed to have an in-person evaluation today at a local Urgent Care facility, please use the link below. It will take you to a list of all of our available [facility], including address, phone number and hours of operation. Please do not delay care.  [facility]  If you or a family member do not have a primary care provider, use the link below to schedule a visit and establish care. When you choose a [facility] primary care physician or advanced practice provider, you gain a long-term partner in health. Find a Primary Care Provider  Learn more about [facility]'s in-office and virtual care options: [facility]

## 2022-01-18 ENCOUNTER — Ambulatory Visit (INDEPENDENT_AMBULATORY_CARE_PROVIDER_SITE_OTHER): Payer: 59 | Admitting: Advanced Practice Midwife

## 2022-01-18 ENCOUNTER — Encounter: Payer: Self-pay | Admitting: Advanced Practice Midwife

## 2022-01-18 ENCOUNTER — Other Ambulatory Visit (HOSPITAL_COMMUNITY)
Admission: RE | Admit: 2022-01-18 | Discharge: 2022-01-18 | Disposition: A | Discharge status: Home / Self Care | Payer: 59 | Source: Ambulatory Visit | Attending: Advanced Practice Midwife | Admitting: Advanced Practice Midwife

## 2022-01-18 ENCOUNTER — Other Ambulatory Visit: Payer: Self-pay

## 2022-01-18 VITALS — BP 120/80 | Ht 61.0 in | Wt 174.0 lb

## 2022-01-18 DIAGNOSIS — Z01419 Encounter for gynecological examination (general) (routine) without abnormal findings: Secondary | ICD-10-CM

## 2022-01-18 DIAGNOSIS — Z124 Encounter for screening for malignant neoplasm of cervix: Secondary | ICD-10-CM | POA: Insufficient documentation

## 2022-01-18 DIAGNOSIS — Z8742 Personal history of other diseases of the female genital tract: Secondary | ICD-10-CM

## 2022-01-18 DIAGNOSIS — N920 Excessive and frequent menstruation with regular cycle: Secondary | ICD-10-CM

## 2022-01-18 DIAGNOSIS — R102 Pelvic and perineal pain: Secondary | ICD-10-CM

## 2022-01-18 MED ORDER — NORETHIN ACE-ETH ESTRAD-FE 1-20 MG-MCG PO TABS
1.0000 | ORAL_TABLET | Freq: Every day | ORAL | 11 refills | Status: DC
  Filled 2022-01-18 – 2022-02-01 (×2): qty 84, 84d supply, fill #0

## 2022-01-18 NOTE — Progress Notes (Signed)
[facility]   Gynecology Annual Exam   PCP: [name], DO  Chief Complaint:  Chief Complaint  Patient presents with   Ovarian Cyst   Menstrual Problem   Annual Exam    History of Present Illness: Patient is a 32 y.o. [id number] presents for annual exam. The patient has complaints today of heavy menstrual bleeding and pelvic pain. The heavy bleeding started after her tubal ligation. She has a history of ovarian cyst and wonders if the pelvic pain and heaviness may be another cyst. The pain started a couple months ago and has been getting worse.   LMP: Patient's last menstrual period was 01/12/2022 (exact date). Average Interval: regular,  28  days Duration of flow:  4-5  days Heavy Menses: yes Clots: yes Intermenstrual Bleeding: no (her most recent period started a week early) Postcoital Bleeding: no Dysmenorrhea: no  The patient is sexually active. She currently uses tubal ligation for contraception. She denies dyspareunia.  The patient does perform self breast exams.  There  is possible  notable family history of breast or ovarian cancer in her family. [facility] phone number given to her and she will call to see if insurance will cover testing.  The patient wears seatbelts: yes.   The patient has regular exercise: she walks regularly, she admits healthy diet, hydration and sleep.    The patient denies current symptoms of depression. She is stable on her current dose of prozac.  Review of Systems: Review of Systems  Constitutional:  Positive for malaise/fatigue. Negative for chills and fever.  HENT:  Negative for congestion, ear discharge, ear pain, hearing loss, sinus pain and sore throat.   Eyes:  Negative for blurred vision and double vision.  Respiratory:  Negative for cough, shortness of breath and wheezing.   Cardiovascular:  Negative for chest pain, palpitations and leg swelling.  Gastrointestinal:  Positive for nausea. Negative for abdominal pain, blood in stool,  constipation, diarrhea, heartburn, melena and vomiting.  Genitourinary:  Positive for frequency. Negative for dysuria, flank pain, hematuria and urgency.       Positive for pelvic pain (left) and heavy feeling  Musculoskeletal:  Negative for back pain, joint pain and myalgias.  Skin:  Negative for itching and rash.  Neurological:  Negative for dizziness, tingling, tremors, sensory change, speech change, focal weakness, seizures, loss of consciousness, weakness and headaches.  Endo/Heme/Allergies:  Negative for environmental allergies. Does not bruise/bleed easily.  Psychiatric/Behavioral:  Negative for depression, hallucinations, memory loss, substance abuse and suicidal ideas. The patient is not nervous/anxious and does not have insomnia.     Past Medical History:  Patient Active Problem List   Diagnosis Date Noted   Depression, recurrent ([place]) 01/03/2020   History of morbid obesity 01/03/2020   Chronic tension-type headache, not intractable 01/31/2016    Past Surgical History:  Past Surgical History:  Procedure Laterality Date   BUNIONECTOMY  2010 and 2011   [place] WITH BILATERAL TUBAL LIGATION  04/08/2019   Procedure: CESAREAN SECTION WITH BILATERAL TUBAL LIGATION;  Surgeon: [name], MD;  Location: [facility];  Service: Obstetrics;;   mirena IUD  01/2014   OVARIAN CYST REMOVAL  August 2014   TUBAL LIGATION  04/2019   WISDOM TOOTH EXTRACTION  2014    Gynecologic History:  Patient's last menstrual period was 01/12/2022 (exact date). Contraception: tubal ligation Last Pap: 2021 Results were: no abnormalities   Obstetric History: [id number]  Family History:  Family  History  Problem Relation Age of Onset   Hypertension Mother    Mental illness Mother        Depression, Anxiety   Hypertension Father    Hypertension Brother    Hypertension Maternal Grandmother    Diabetes Maternal Grandmother    Hyperlipidemia Maternal Grandmother    Heart  disease Maternal Grandmother    COPD Maternal Grandmother    Kidney disease Maternal Grandmother    Mental illness Maternal Grandmother    Hypertension Sister    Mental illness Sister    Heart disease Maternal Grandfather    Heart attack Maternal Grandfather    Cancer Paternal Grandmother     Social History:  Social History   Socioeconomic History   Marital status: Married    Spouse name: Not on file   Number of children: Not on file   Years of education: Not on file   Highest education level: Not on file  Occupational History   Not on file  Tobacco Use   Smoking status: Never   Smokeless tobacco: Never  Vaping Use   Vaping Use: Never used  Substance and Sexual Activity   Alcohol use: No   Drug use: No   Sexual activity: Yes    Birth control/protection: Surgical    Comment: Tubal Ligation  Other Topics Concern   Not on file  Social History Narrative   Not on file   Social Determinants of Health   Financial Resource Strain: Not on file  Food Insecurity: Not on file  Transportation Needs: Not on file  Physical Activity: Not on file  Stress: Not on file  Social Connections: Not on file  Intimate Partner Violence: Not on file    Allergies:  Allergies  Allergen Reactions   Nickel Rash    Medications: Prior to Admission medications   Medication Sig Start Date End Date Taking? Authorizing Provider  acetaminophen (TYLENOL) 325 MG tablet Take 650 mg by mouth every 6 (six) hours as needed for moderate pain.   Yes [provider]  albuterol (VENTOLIN HFA) 108 (90 Base) MCG/ACT inhaler Inhale 2 puffs into the lungs every 6 (six) hours as needed. 08/16/21  Yes Tyler Pita, MD  Albuterol-Budesonide (AIRSUPRA) 90-80 MCG/ACT AERO Inhale 2 puffs into the lungs every 4 (four) hours as needed. 12/21/21  Yes Tyler Pita, MD  FLUoxetine (PROZAC) 20 MG tablet Take 1 tab for 1-2 weeks, then increase to 2 tabs daily 08/01/21  Yes Johnson, Megan P, DO   Insulin Pen Needle (PEN NEEDLES 31GX5/16") 31G X 8 MM MISC use daily. 01/05/21  Yes Johnson, Megan P, DO  norethindrone-ethinyl estradiol-FE (JUNEL FE 1/20) 1-20 MG-MCG tablet Take 1 tablet by mouth daily. 01/18/22  Yes Rod Can, CNM  oseltamivir (TAMIFLU) 75 MG capsule Take 1 capsule (75 mg total) by mouth 2 (two) times daily for 5 days. 01/15/22 01/20/22 Yes Perlie Mayo, NP  Semaglutide-Weight Management 2.4 MG/0.75ML SOAJ Inject 2.4 mg into the skin once a week. 08/01/21 01/25/22 Yes Johnson, Barb Merino, DO    Physical Exam Vitals: Blood pressure 120/80, height '5\' 1"'$  (1.549 m), weight 174 lb (78.9 kg), last menstrual period 01/12/2022.  General: NAD HEENT: normocephalic, anicteric Thyroid: no enlargement, no palpable nodules Pulmonary: No increased work of breathing, CTAB Cardiovascular: RRR, distal pulses 2+ Breast: Breast symmetrical, no tenderness, no palpable nodules or masses, no skin or nipple retraction present, no nipple discharge.  No axillary or supraclavicular lymphadenopathy. Abdomen: NABS, soft, non-tender, non-distended.  Umbilicus  without lesions.  No hepatomegaly, splenomegaly or masses palpable. No evidence of hernia  Genitourinary:  External: Normal external female genitalia.  Normal urethral meatus, normal Bartholin's and Skene's glands.    Vagina: Normal vaginal mucosa, no evidence of prolapse.    Cervix: Grossly normal in appearance, no bleeding  Uterus: Non-enlarged, mobile, normal contour.  No CMT  Adnexa: ovaries non-enlarged, no adnexal masses  Rectal: deferred  Lymphatic: no evidence of inguinal lymphadenopathy Extremities: no edema, erythema, or tenderness Neurologic: Grossly intact Psychiatric: mood appropriate, affect full   Assessment: 32 y.o. [id number] routine annual exam  Plan: Problem List Items Addressed This Visit   None Visit Diagnoses     Well woman exam with routine gynecological exam    -  Primary   Relevant Orders   Cytology - PAP    Pelvic pain       Relevant Orders   US PELVIS TRANSVAGINAL NON-OB (TV ONLY)   History of ovarian cyst       Relevant Orders   US PELVIS TRANSVAGINAL NON-OB (TV ONLY)   Screening for cervical cancer       Relevant Orders   Cytology - PAP   Menorrhagia with regular cycle       Relevant Medications   norethindrone-ethinyl estradiol-FE (JUNEL FE 1/20) 1-20 MG-MCG tablet       1) STI screening  was offered and declined  2)  ASCCP guidelines and rationale discussed.  Patient opts for every 3 years screening interval  3) Contraception - the patient is currently using  tubal ligation.  She is happy with her current form of contraception and plans to continue. Start OCP [facility] for heavy menstrual bleeding.  4) Routine healthcare maintenance including cholesterol, diabetes screening discussed Declines  5) Return for gyn u/s in the next couple weeks with telephone f/u after and annual in 1 year.   Rod Can, [facility] 01/18/2022, 5:26 PM

## 2022-01-23 LAB — CYTOLOGY - PAP
Comment: NEGATIVE
Diagnosis: NEGATIVE
High risk HPV: NEGATIVE

## 2022-01-30 ENCOUNTER — Other Ambulatory Visit: Payer: Self-pay

## 2022-01-30 ENCOUNTER — Encounter: Payer: Self-pay | Admitting: Family Medicine

## 2022-02-01 ENCOUNTER — Other Ambulatory Visit: Payer: Self-pay

## 2022-02-01 ENCOUNTER — Encounter: Payer: No Typology Code available for payment source | Admitting: Family Medicine

## 2022-02-01 ENCOUNTER — Telehealth: Payer: 59 | Admitting: Family Medicine

## 2022-02-06 ENCOUNTER — Ambulatory Visit
Admission: RE | Admit: 2022-02-06 | Discharge: 2022-02-06 | Disposition: A | Discharge status: Home / Self Care | Payer: 59 | Source: Ambulatory Visit | Attending: Advanced Practice Midwife | Admitting: Advanced Practice Midwife

## 2022-02-06 DIAGNOSIS — Z8742 Personal history of other diseases of the female genital tract: Secondary | ICD-10-CM | POA: Diagnosis not present

## 2022-02-06 DIAGNOSIS — R102 Pelvic and perineal pain unspecified side: Secondary | ICD-10-CM

## 2022-02-07 ENCOUNTER — Ambulatory Visit (INDEPENDENT_AMBULATORY_CARE_PROVIDER_SITE_OTHER): Payer: 59 | Admitting: Advanced Practice Midwife

## 2022-02-07 ENCOUNTER — Encounter: Payer: Self-pay | Admitting: Advanced Practice Midwife

## 2022-02-07 ENCOUNTER — Other Ambulatory Visit: Payer: 59

## 2022-02-07 DIAGNOSIS — Z09 Encounter for follow-up examination after completed treatment for conditions other than malignant neoplasm: Secondary | ICD-10-CM | POA: Diagnosis not present

## 2022-02-07 NOTE — Patient Instructions (Signed)
Uterine Fibroids  Uterine fibroids, also called leiomyomas, are noncancerous (benign) tumors that can grow in the uterus. They can cause heavy menstrual bleeding and pain. Fibroids may also grow in the fallopian tubes, cervix, or tissues (ligaments) near the uterus. You may have one or many fibroids. Fibroids vary in size, weight, and where they grow in the uterus. Some can become quite large. Most fibroids do not require medical treatment. What are the causes? The cause of this condition is not known. What increases the risk? You are more likely to develop this condition if you: Are in your 30s or 40s and have not gone through menopause. Have a family history of this condition. Are of African American descent. Started your menstrual period at age 62 or younger. Have never given birth. Are overweight or obese. What are the signs or symptoms? Many women do not have any symptoms. Symptoms of this condition may include: Heavy menstrual bleeding. Bleeding between menstrual periods. Pain and pressure in the pelvic area, between your hip bones. Pain during sex. Bladder problems, such as needing to urinate right away or more often than usual. Inability to have children (infertility). Failure to carry pregnancy to term (miscarriage). How is this diagnosed? This condition may be diagnosed based on: Your symptoms and medical history. A physical exam. A pelvic exam that includes feeling for any tumors. Imaging tests, such as ultrasound or MRI. How is this treated? Treatment for this condition may include follow-up visits with your health care provider to monitor your fibroids for any changes. Other treatment may include: Medicines, such as: Medicines to relieve pain, including aspirin and NSAIDs, such as ibuprofen or naproxen. Hormone therapy. Treatment may be given as a pill or an injection, or it may be inserted into the uterus using an intrauterine device (IUD). Surgery that would do one of  the following: Remove the fibroids (myomectomy). This may be recommended if fibroids affect your fertility and you want to become pregnant. Remove the uterus (hysterectomy). Block the blood supply to the fibroids (uterine artery embolization). This can cause them to shrink and die. Follow these instructions at home: Medicines Take over-the-counter and prescription medicines only as told by your health care provider. Ask your health care provider if you should take iron pills or eat more iron-rich foods, such as dark green, leafy vegetables. Heavy menstrual bleeding can cause low iron levels. Managing pain If directed, apply heat to your back or abdomen to reduce pain. Use the heat source that your health care provider recommends, such as a moist heat pack or a heating pad. To apply heat: Place a towel between your skin and the heat source. Leave the heat on for 20-30 minutes. Remove the heat if your skin turns bright red. This is especially important if you are unable to feel pain, heat, or cold. You may have a greater risk of getting burned.  General instructions Pay close attention to your menstrual cycle. Tell your health care provider about any changes, such as: Heavier bleeding that requires you to change your pads or tampons more than usual. A change in the number of days that your menstrual period lasts. A change in symptoms that come with your menstrual period, such as back pain or cramps in your abdomen. Keep all follow-up visits. This is important, especially if your fibroids need to be monitored for any changes. Contact a health care provider if you: Have pelvic pain, back pain, or cramps in your abdomen that do not get better with  medicine or heat. Develop new bleeding between menstrual periods. Have increased bleeding during or between menstrual periods. Feel more tired or weak than usual. Feel light-headed. Get help right away if you: Faint. Have pelvic pain that suddenly  gets worse. Have severe vaginal bleeding that soaks a tampon or pad in 30 minutes or less. Summary Uterine fibroids are noncancerous (benign) tumors that can develop in the uterus. The exact cause of this condition is not known. Most fibroids do not require medical treatment unless they affect your ability to have children (fertility). Contact a health care provider if you have pelvic pain, back pain, or cramps in your abdomen that do not get better with medicines. Get help right away if you faint, have pelvic pain that suddenly gets worse, or have severe vaginal bleeding. This information is not intended to replace advice given to you by your health care provider. Make sure you discuss any questions you have with your health care provider. Document Revised: 07/27/2019 Document Reviewed: 07/27/2019 Elsevier Patient Education  [place].

## 2022-02-07 NOTE — Progress Notes (Signed)
[name]  Virtual Visit via Telephone Note  I connected with [name] on 02/07/22 at  3:55 PM EST by telephone and verified that I am speaking with the correct person using two identifiers.   I discussed the limitations, risks, security and privacy concerns of performing an evaluation and management service by telephone and the availability of in person appointments. I also discussed with the patient that there may be a patient responsible charge related to this service. The patient expressed understanding and agreed to proceed.  The patient was at home I spoke with the patient from my  office phone The names of people involved in this encounter were: [name] , and myself Rod Can, CNM   Patient ID: Breanna Lamb, female   DOB: 03/01/1990, 32 y.o.   MRN: [id number]  Reason for Consult: Follow-up   Referred by [name], DO  Subjective:  HPI:  Breanna Lamb is a 32 y.o. female telephone follow up for gyn ultrasound. Reviewed essentially normal findings from scan yesterday. No cysts seen which was patient's concern. A small- 1 cm- likely fibroid seen in lower anterior right of uterus. Patient has started previously prescribed OCP and has not yet had a cycle. Reviewed other options for menorrhagia.   Past Medical History:  Diagnosis Date   Allergic rhinitis    Asthma    childhood   Benign positional vertigo    Bunion    Contraceptive management    Depression    Genital herpes    Genital herpes simplex virus (HSV) infection in mother affecting pregnancy 07/29/2018   Henoch-Schonlein purpura ([place])    Low-lying placenta 11/19/2018   Resolved 23 week anatomy follow up   Lymphadenopathy    Neck pain    Obesity    Ovarian cyst, right    Poor concentration    Premenstrual dysphoric syndrome    Premenstrual dysphoric syndrome    Screening for venereal disease    Skin lesion    Family History  Problem Relation Age of Onset   Hypertension Mother    Mental  illness Mother        Depression, Anxiety   Hypertension Father    Hypertension Brother    Hypertension Maternal Grandmother    Diabetes Maternal Grandmother    Hyperlipidemia Maternal Grandmother    Heart disease Maternal Grandmother    COPD Maternal Grandmother    Kidney disease Maternal Grandmother    Mental illness Maternal Grandmother    Hypertension Sister    Mental illness Sister    Heart disease Maternal Grandfather    Heart attack Maternal Grandfather    Cancer Paternal Grandmother    Past Surgical History:  Procedure Laterality Date   BUNIONECTOMY  2010 and 2011   CESAREAN SECTION     CESAREAN SECTION WITH BILATERAL TUBAL LIGATION  04/08/2019   Procedure: CESAREAN SECTION WITH BILATERAL TUBAL LIGATION;  Surgeon: [name], MD;  Location: [facility] ORS;  Service: Obstetrics;;   mirena IUD  01/2014   OVARIAN CYST REMOVAL  August 2014   TUBAL LIGATION  04/2019   WISDOM TOOTH EXTRACTION  2014    Short Social History:  Social History   Tobacco Use   Smoking status: Never   Smokeless tobacco: Never  Substance Use Topics   Alcohol use: No    Allergies  Allergen Reactions   Nickel Rash    Current Outpatient Medications  Medication Sig Dispense Refill   acetaminophen (TYLENOL) 325 MG tablet Take 650  mg by mouth every 6 (six) hours as needed for moderate pain.     albuterol (VENTOLIN HFA) 108 (90 Base) MCG/ACT inhaler Inhale 2 puffs into the lungs every 6 (six) hours as needed. 6.7 g 2   Albuterol-Budesonide (AIRSUPRA) 90-80 MCG/ACT AERO Inhale 2 puffs into the lungs every 4 (four) hours as needed. 5.9 g 0   FLUoxetine (PROZAC) 20 MG tablet Take 1 tab for 1-2 weeks, then increase to 2 tabs daily 180 tablet 1   Insulin Pen Needle (PEN NEEDLES 31GX5/16") 31G X 8 MM MISC use daily. 100 each 12   norethindrone-ethinyl estradiol-FE (JUNEL FE 1/20) 1-20 MG-MCG tablet Take 1 tablet by mouth daily. 28 tablet 11   Semaglutide-Weight Management 2.4 MG/0.75ML SOAJ Inject 2.4  mg into the skin once a week. 9 mL 1   No current facility-administered medications for this visit.    Review of Systems  Constitutional:  Negative for chills and fever.  HENT:  Negative for congestion, ear discharge, ear pain, hearing loss, sinus pain and sore throat.   Eyes:  Negative for blurred vision and double vision.  Respiratory:  Negative for cough, shortness of breath and wheezing.   Cardiovascular:  Negative for chest pain, palpitations and leg swelling.  Gastrointestinal:  Negative for abdominal pain, blood in stool, constipation, diarrhea, heartburn, melena, nausea and vomiting.  Genitourinary:  Negative for dysuria, flank pain, frequency, hematuria and urgency.  Musculoskeletal:  Negative for back pain, joint pain and myalgias.  Skin:  Negative for itching and rash.  Neurological:  Negative for dizziness, tingling, tremors, sensory change, speech change, focal weakness, seizures, loss of consciousness, weakness and headaches.  Endo/Heme/Allergies:  Negative for environmental allergies. Does not bruise/bleed easily.  Psychiatric/Behavioral:  Negative for depression, hallucinations, memory loss, substance abuse and suicidal ideas. The patient is not nervous/anxious and does not have insomnia.         Objective:  Objective   There were no vitals filed for this visit. Telephone visit There is no height or weight on file to calculate BMI.  Data: ultrasound on 02/06/2022 Narrative & Impression  CLINICAL DATA:  Left pelvic pain for 2 months   EXAM: ULTRASOUND PELVIS TRANSVAGINAL   TECHNIQUE: Transvaginal ultrasound examination of the pelvis was performed including evaluation of the uterus, ovaries, adnexal regions, and pelvic cul-de-sac.   COMPARISON:  None Available.   FINDINGS: Uterus   Measurements: 10.2 x 5.0 x 5.6 cm = volume: 151 mL. A mass off the right anterior lower uterine body is likely a small fibroid measuring 11 mm.   Endometrium   Thickness:  13.2 mm.  No focal abnormality visualized.   Right ovary   Measurements: 3.3 x 1.9 x 1.4 cm = volume: 4.7 mL. Normal appearance/no adnexal mass.   Left ovary   Measurements: 3.0 x 2.4 x 1.8 cm = volume: 6.8 mL. Normal appearance/no adnexal mass.   Other findings: A small amount of fluid is identified in the cul-de-sac.   IMPRESSION: 1. A small amount of fluid in the cul-de-sac is nonspecific but likely physiologic. 2. The endometrium is normal in thickness for a premenopausal female. 3. Probable small fibroid in the right anterior lower uterine body measuring 11 mm. 4. No other abnormalities.   Electronically Signed   By: Dorise Bullion III M.D.   On: 02/06/2022 19:40       Assessment/Plan:     32 y.o. G3 P47 female with normal gyn ultrasound  Follow up as needed regarding menorrhagia/OCPs for now  [facility] 02/07/2022, 4:31 PM

## 2022-02-17 ENCOUNTER — Other Ambulatory Visit: Payer: Self-pay

## 2022-02-26 ENCOUNTER — Other Ambulatory Visit: Payer: Self-pay

## 2022-03-11 ENCOUNTER — Encounter: Payer: Self-pay | Admitting: Family Medicine

## 2022-03-11 ENCOUNTER — Ambulatory Visit (INDEPENDENT_AMBULATORY_CARE_PROVIDER_SITE_OTHER): Payer: 59 | Admitting: Family Medicine

## 2022-03-11 ENCOUNTER — Other Ambulatory Visit: Payer: Self-pay

## 2022-03-11 VITALS — BP 101/65 | HR 62 | Temp 99.3°F | Ht 61.0 in | Wt 175.9 lb

## 2022-03-11 DIAGNOSIS — M25551 Pain in right hip: Secondary | ICD-10-CM

## 2022-03-11 DIAGNOSIS — F32A Depression, unspecified: Secondary | ICD-10-CM

## 2022-03-11 DIAGNOSIS — R5382 Chronic fatigue, unspecified: Secondary | ICD-10-CM

## 2022-03-11 DIAGNOSIS — Z0000 Encounter for general adult medical examination without abnormal findings: Secondary | ICD-10-CM

## 2022-03-11 DIAGNOSIS — Z8639 Personal history of other endocrine, nutritional and metabolic disease: Secondary | ICD-10-CM

## 2022-03-11 DIAGNOSIS — M25552 Pain in left hip: Secondary | ICD-10-CM

## 2022-03-11 DIAGNOSIS — F339 Major depressive disorder, recurrent, unspecified: Secondary | ICD-10-CM

## 2022-03-11 MED ORDER — NAPROXEN 500 MG PO TABS
500.0000 mg | ORAL_TABLET | Freq: Two times a day (BID) | ORAL | 2 refills | Status: DC
  Filled 2022-03-11: qty 60, 30d supply, fill #0

## 2022-03-11 MED ORDER — FLUOXETINE HCL 20 MG PO TABS
40.0000 mg | ORAL_TABLET | Freq: Every day | ORAL | 1 refills | Status: DC
  Filled 2022-03-11: qty 180, 90d supply, fill #0

## 2022-03-11 MED ORDER — ONDANSETRON HCL 4 MG PO TABS
4.0000 mg | ORAL_TABLET | Freq: Three times a day (TID) | ORAL | 3 refills | Status: DC | PRN
  Filled 2022-03-11: qty 20, 7d supply, fill #0

## 2022-03-11 MED ORDER — SEMAGLUTIDE-WEIGHT MANAGEMENT 2.4 MG/0.75ML ~~LOC~~ SOAJ
2.4000 mg | SUBCUTANEOUS | 1 refills | Status: DC
  Filled 2022-03-11: qty 9, 84d supply, fill #0
  Filled 2022-03-27: qty 3, 28d supply, fill #0
  Filled 2022-04-19: qty 3, 28d supply, fill #1

## 2022-03-11 NOTE — Assessment & Plan Note (Signed)
Under good control on current regimen. Continue current regimen. Continue to monitor. Call with any concerns. Refills given.   

## 2022-03-11 NOTE — Assessment & Plan Note (Signed)
Weight continues to be stable. Continue current regimen. Continue to monitor. Call with any concerns. Continue wegovy.

## 2022-03-11 NOTE — Progress Notes (Signed)
BP 101/65   Pulse 62   Temp 99.3 F (37.4 C) (Oral)   Ht '5\' 1"'$  (1.549 m)   Wt 175 lb 14.4 oz (79.8 kg)   SpO2 99%   BMI 33.24 kg/m    Subjective:    Patient ID: Breanna Lamb, female    DOB: 1990/01/11, 32 y.o.   MRN: [id number]  HPI: Breanna Lamb is a 32 y.o. female presenting on 03/11/2022 for comprehensive medical examination. Current medical complaints include:  DEPRESSION Mood status: stable Satisfied with current treatment?: yes Symptom severity: moderate  Duration of current treatment : chronic Side effects: no Medication compliance: excellent compliance Psychotherapy/counseling: no  Previous psychiatric medications: prozac Depressed mood: no Anxious mood: no Anhedonia: no Significant weight loss or gain: no Insomnia: no  Fatigue: yes Feelings of worthlessness or guilt: no Impaired concentration/indecisiveness: no Suicidal ideations: no Hopelessness: no Crying spells: no    03/11/2022    4:34 PM 08/01/2021   10:56 AM 02/19/2021    4:20 PM 01/05/2021    8:29 AM 07/03/2020    1:21 PM  Depression screen PHQ 2/9  Decreased Interest '1 1 1 2 2  '$ Down, Depressed, Hopeless 1 0 '1 1 1  '$ PHQ - 2 Score '2 1 2 3 3  '$ Altered sleeping '3 1 2 2 2  '$ Tired, decreased energy '3 1 2 2 2  '$ Change in appetite 1 0 0 3 1  Feeling bad or failure about yourself  0 0 0 1 0  Trouble concentrating '1 1 1 1 1  '$ Moving slowly or fidgety/restless 0 0 0 0 0  Suicidal thoughts 0 0 0 0 0  PHQ-9 Score '10 4 7 12 9  '$ Difficult doing work/chores Somewhat difficult  Not difficult at all Somewhat difficult Not difficult at all   OBESITY Duration: chronic Previous attempts at weight loss: yes Complications of obesity: depression Peak weight: 246 Weight loss goal: to maintain  Weight loss to date: 71lbs Requesting obesity pharmacotherapy: yes Current weight loss supplements/medications: yes  She currently lives with: husband and kids Menopausal Symptoms: no  Depression Screen done today and  results listed below:     03/11/2022    4:34 PM 08/01/2021   10:56 AM 02/19/2021    4:20 PM 01/05/2021    8:29 AM 07/03/2020    1:21 PM  Depression screen PHQ 2/9  Decreased Interest '1 1 1 2 2  '$ Down, Depressed, Hopeless 1 0 '1 1 1  '$ PHQ - 2 Score '2 1 2 3 3  '$ Altered sleeping '3 1 2 2 2  '$ Tired, decreased energy '3 1 2 2 2  '$ Change in appetite 1 0 0 3 1  Feeling bad or failure about yourself  0 0 0 1 0  Trouble concentrating '1 1 1 1 1  '$ Moving slowly or fidgety/restless 0 0 0 0 0  Suicidal thoughts 0 0 0 0 0  PHQ-9 Score '10 4 7 12 9  '$ Difficult doing work/chores Somewhat difficult  Not difficult at all Somewhat difficult Not difficult at all    Past Medical History:  Past Medical History:  Diagnosis Date   Allergic rhinitis    Asthma    childhood   Benign positional vertigo    Bunion    Contraceptive management    Depression    Genital herpes    Genital herpes simplex virus (HSV) infection in mother affecting pregnancy 07/29/2018   Henoch-Schonlein purpura ([place])    Low-lying placenta 11/19/2018   Resolved 23  week anatomy follow up   Lymphadenopathy    Neck pain    Obesity    Ovarian cyst, right    Poor concentration    Premenstrual dysphoric syndrome    Premenstrual dysphoric syndrome    Screening for venereal disease    Skin lesion     Surgical History:  Past Surgical History:  Procedure Laterality Date   BUNIONECTOMY  2010 and 2011   [place] WITH BILATERAL TUBAL LIGATION  04/08/2019   Procedure: CESAREAN SECTION WITH BILATERAL TUBAL LIGATION;  Surgeon: [name], MD;  Location: [facility] ORS;  Service: Obstetrics;;   mirena IUD  01/2014   OVARIAN CYST REMOVAL  August 2014   TUBAL LIGATION  04/2019   WISDOM TOOTH EXTRACTION  2014    Medications:  Current Outpatient Medications on File Prior to Visit  Medication Sig   acetaminophen (TYLENOL) 325 MG tablet Take 650 mg by mouth every 6 (six) hours as needed for moderate pain.   albuterol  (VENTOLIN HFA) 108 (90 Base) MCG/ACT inhaler Inhale 2 puffs into the lungs every 6 (six) hours as needed.   Albuterol-Budesonide (AIRSUPRA) 90-80 MCG/ACT AERO Inhale 2 puffs into the lungs every 4 (four) hours as needed.   Insulin Pen Needle (PEN NEEDLES 31GX5/16") 31G X 8 MM MISC use daily.   norethindrone-ethinyl estradiol-FE (JUNEL FE 1/20) 1-20 MG-MCG tablet Take 1 tablet by mouth daily.   No current facility-administered medications on file prior to visit.    Allergies:  Allergies  Allergen Reactions   Nickel Rash    Social History:  Social History   Socioeconomic History   Marital status: Married    Spouse name: Not on file   Number of children: Not on file   Years of education: Not on file   Highest education level: Not on file  Occupational History   Not on file  Tobacco Use   Smoking status: Never   Smokeless tobacco: Never  Vaping Use   Vaping Use: Never used  Substance and Sexual Activity   Alcohol use: No   Drug use: No   Sexual activity: Yes    Birth control/protection: Surgical    Comment: Tubal Ligation  Other Topics Concern   Not on file  Social History Narrative   Not on file   Social Determinants of Health   Financial Resource Strain: Not on file  Food Insecurity: Not on file  Transportation Needs: Not on file  Physical Activity: Not on file  Stress: Not on file  Social Connections: Not on file  Intimate Partner Violence: Not on file   Social History   Tobacco Use  Smoking Status Never  Smokeless Tobacco Never   Social History   Substance and Sexual Activity  Alcohol Use No    Family History:  Family History  Problem Relation Age of Onset   Hypertension Mother    Mental illness Mother        Depression, Anxiety   Hypertension Father    Hypertension Brother    Hypertension Maternal Grandmother    Diabetes Maternal Grandmother    Hyperlipidemia Maternal Grandmother    Heart disease Maternal Grandmother    COPD Maternal  Grandmother    Kidney disease Maternal Grandmother    Mental illness Maternal Grandmother    Hypertension Sister    Mental illness Sister    Heart disease Maternal Grandfather    Heart attack Maternal Grandfather    Cancer Paternal Grandmother  Past medical history, surgical history, medications, allergies, family history and social history reviewed with patient today and changes made to appropriate areas of the chart.   Review of Systems  Constitutional:  Positive for malaise/fatigue. Negative for chills, diaphoresis, fever and weight loss.  HENT: Negative.    Eyes: Negative.   Respiratory: Negative.    Cardiovascular: Negative.   Gastrointestinal:  Positive for abdominal pain. Negative for blood in stool, constipation, diarrhea, heartburn, melena, nausea and vomiting.  Genitourinary: Negative.   Musculoskeletal:  Positive for joint pain (bilateral hips). Negative for back pain, falls, myalgias and neck pain.  Skin: Negative.   Neurological: Negative.   Endo/Heme/Allergies: Negative.   Psychiatric/Behavioral: Negative.     All other ROS negative except what is listed above and in the HPI.      Objective:    BP 101/65   Pulse 62   Temp 99.3 F (37.4 C) (Oral)   Ht '5\' 1"'$  (1.549 m)   Wt 175 lb 14.4 oz (79.8 kg)   SpO2 99%   BMI 33.24 kg/m   Wt Readings from Last 3 Encounters:  03/11/22 175 lb 14.4 oz (79.8 kg)  01/18/22 174 lb (78.9 kg)  08/01/21 172 lb 12.8 oz (78.4 kg)    Physical Exam Vitals and nursing note reviewed.  Constitutional:      General: She is not in acute distress.    Appearance: Normal appearance. She is not ill-appearing, toxic-appearing or diaphoretic.  HENT:     Head: Normocephalic and atraumatic.     Right Ear: Tympanic membrane, ear canal and external ear normal. There is no impacted cerumen.     Left Ear: Tympanic membrane, ear canal and external ear normal. There is no impacted cerumen.     Nose: Nose normal. No congestion or rhinorrhea.      Mouth/Throat:     Mouth: Mucous membranes are moist.     Pharynx: Oropharynx is clear. No oropharyngeal exudate or posterior oropharyngeal erythema.  Eyes:     General: No scleral icterus.       Right eye: No discharge.        Left eye: No discharge.     Extraocular Movements: Extraocular movements intact.     Conjunctiva/sclera: Conjunctivae normal.     Pupils: Pupils are equal, round, and reactive to light.  Neck:     Vascular: No carotid bruit.  Cardiovascular:     Rate and Rhythm: Normal rate and regular rhythm.     Pulses: Normal pulses.     Heart sounds: No murmur heard.    No friction rub. No gallop.  Pulmonary:     Effort: Pulmonary effort is normal. No respiratory distress.     Breath sounds: Normal breath sounds. No stridor. No wheezing, rhonchi or rales.  Chest:     Chest wall: No tenderness.  Abdominal:     General: Abdomen is flat. Bowel sounds are normal. There is no distension.     Palpations: Abdomen is soft. There is no mass.     Tenderness: There is no abdominal tenderness. There is no right CVA tenderness, left CVA tenderness, guarding or rebound.     Hernia: No hernia is present.  Genitourinary:    Comments: Breast and pelvic exams deferred with shared decision making Musculoskeletal:        General: No swelling, tenderness, deformity or signs of injury.     Cervical back: Normal range of motion and neck supple. No rigidity. No muscular tenderness.  Right lower leg: No edema.     Left lower leg: No edema.  Lymphadenopathy:     Cervical: No cervical adenopathy.  Skin:    General: Skin is warm and dry.     Capillary Refill: Capillary refill takes less than 2 seconds.     Coloration: Skin is not jaundiced or pale.     Findings: No bruising, erythema, lesion or rash.  Neurological:     General: No focal deficit present.     Mental Status: She is alert and oriented to person, place, and time. Mental status is at baseline.     Cranial Nerves: No  cranial nerve deficit.     Sensory: No sensory deficit.     Motor: No weakness.     Coordination: Coordination normal.     Gait: Gait normal.     Deep Tendon Reflexes: Reflexes normal.  Psychiatric:        Mood and Affect: Mood normal.        Behavior: Behavior normal.        Thought Content: Thought content normal.        Judgment: Judgment normal.     Results for orders placed or performed in visit on 01/18/22  Cytology - PAP  Result Value Ref Range   High risk HPV Negative    Adequacy      Satisfactory for evaluation; transformation zone component PRESENT.   Diagnosis      - Negative for intraepithelial lesion or malignancy (NILM)   Comment Normal Reference Range HPV - Negative       Assessment & Plan:   Problem List Items Addressed This Visit       Other   Depression, recurrent ([place])    Under good control on current regimen. Continue current regimen. Continue to monitor. Call with any concerns. Refills given.        Relevant Medications   FLUoxetine (PROZAC) 20 MG tablet   History of morbid obesity    Weight continues to be stable. Continue current regimen. Continue to monitor. Call with any concerns. Continue wegovy.      Other Visit Diagnoses     Routine general medical examination at a health care facility    -  Primary   Vaccines up to date. Screening labs checked today. Pap done at GYN. Continue diet and exercise. Call with any concerns.   Relevant Orders   CBC with Differential/Platelet   Comprehensive metabolic panel   Lipid Panel w/o Chol/HDL Ratio   Urinalysis, Routine w reflex microscopic   TSH   Chronic fatigue       Will check labs today. If normal consider increasing prozac.   Relevant Orders   B12   VITAMIN D 25 Hydroxy (Vit-D Deficiency, Fractures)   Bilateral hip pain       Will treat with stretches and naproxen. Call with any concerns or if not getting better.        Follow up plan: Return in about 6 months (around  09/11/2022).   LABORATORY TESTING:  - Pap smear: up to date  IMMUNIZATIONS:   - Tdap: Tetanus vaccination status reviewed: last tetanus booster within 10 years. - Influenza: Up to date - Pneumovax: Not applicable - Prevnar: Not applicable - COVID: Up to date - HPV: Up to date - Shingrix vaccine: Not applicable  PATIENT COUNSELING:   Advised to take 1 mg of folate supplement per day if capable of pregnancy.   Sexuality: Discussed sexually transmitted diseases, partner  selection, use of condoms, avoidance of unintended pregnancy  and contraceptive alternatives.   Advised to avoid cigarette smoking.  I discussed with the patient that most people either abstain from alcohol or drink within safe limits (<=14/week and <=4 drinks/occasion for males, <=7/weeks and <= 3 drinks/occasion for females) and that the risk for alcohol disorders and other health effects rises proportionally with the number of drinks per week and how often a drinker exceeds daily limits.  Discussed cessation/primary prevention of drug use and availability of treatment for abuse.   Diet: Encouraged to adjust caloric intake to maintain  or achieve ideal body weight, to reduce intake of dietary saturated fat and total fat, to limit sodium intake by avoiding high sodium foods and not adding table salt, and to maintain adequate dietary potassium and calcium preferably from fresh fruits, vegetables, and low-fat dairy products.    stressed the importance of regular exercise  Injury prevention: Discussed safety belts, safety helmets, smoke detector, smoking near bedding or upholstery.   Dental health: Discussed importance of regular tooth brushing, flossing, and dental visits.    NEXT PREVENTATIVE PHYSICAL DUE IN 1 YEAR. Return in about 6 months (around 09/11/2022).

## 2022-03-12 ENCOUNTER — Encounter: Payer: Self-pay | Admitting: Family Medicine

## 2022-03-12 ENCOUNTER — Other Ambulatory Visit: Payer: Self-pay

## 2022-03-12 ENCOUNTER — Other Ambulatory Visit: Payer: Self-pay | Admitting: Family Medicine

## 2022-03-12 LAB — CBC WITH DIFFERENTIAL/PLATELET
Basophils Absolute: 0.1 10*3/uL (ref 0.0–0.2)
Basos: 1 %
EOS (ABSOLUTE): 0.1 10*3/uL (ref 0.0–0.4)
Eos: 2 %
Hematocrit: 38.9 % (ref 34.0–46.6)
Hemoglobin: 13 g/dL (ref 11.1–15.9)
Immature Grans (Abs): 0 10*3/uL (ref 0.0–0.1)
Immature Granulocytes: 0 %
Lymphocytes Absolute: 3 10*3/uL (ref 0.7–3.1)
Lymphs: 48 %
MCH: 28.8 pg (ref 26.6–33.0)
MCHC: 33.4 g/dL (ref 31.5–35.7)
MCV: 86 fL (ref 79–97)
Monocytes Absolute: 0.4 10*3/uL (ref 0.1–0.9)
Monocytes: 7 %
Neutrophils Absolute: 2.6 10*3/uL (ref 1.4–7.0)
Neutrophils: 42 %
Platelets: 286 10*3/uL (ref 150–450)
RBC: 4.51 x10E6/uL (ref 3.77–5.28)
RDW: 12.3 % (ref 11.7–15.4)
WBC: 6.2 10*3/uL (ref 3.4–10.8)

## 2022-03-12 LAB — TSH: TSH: 0.848 u[IU]/mL (ref 0.450–4.500)

## 2022-03-12 LAB — LIPID PANEL W/O CHOL/HDL RATIO
Cholesterol, Total: 173 mg/dL (ref 100–199)
HDL: 50 mg/dL (ref >39)
LDL Chol Calc (NIH): 108 mg/dL — ABNORMAL HIGH (ref 0–99)
Triglycerides: 79 mg/dL (ref 0–149)
VLDL Cholesterol Cal: 15 mg/dL (ref 5–40)

## 2022-03-12 LAB — COMPREHENSIVE METABOLIC PANEL
ALT: 14 IU/L (ref 0–32)
AST: 12 IU/L (ref 0–40)
Albumin/Globulin Ratio: 1.9 (ref 1.2–2.2)
Albumin: 4.4 g/dL (ref 4.0–5.0)
Alkaline Phosphatase: 67 IU/L (ref 44–121)
BUN/Creatinine Ratio: 10 (ref 9–23)
BUN: 9 mg/dL (ref 6–20)
Bilirubin Total: 1.3 mg/dL — ABNORMAL HIGH (ref 0.0–1.2)
CO2: 22 mmol/L (ref 20–29)
Calcium: 9.1 mg/dL (ref 8.7–10.2)
Chloride: 104 mmol/L (ref 96–106)
Creatinine, Ser: 0.87 mg/dL (ref 0.57–1.00)
Globulin, Total: 2.3 g/dL (ref 1.5–4.5)
Glucose: 93 mg/dL (ref 70–99)
Potassium: 3.9 mmol/L (ref 3.5–5.2)
Sodium: 139 mmol/L (ref 134–144)
Total Protein: 6.7 g/dL (ref 6.0–8.5)
eGFR: 92 mL/min/{1.73_m2} (ref >59)

## 2022-03-12 LAB — URINALYSIS, ROUTINE W REFLEX MICROSCOPIC
Bilirubin, UA: NEGATIVE
Glucose, UA: NEGATIVE
Leukocytes,UA: NEGATIVE
Nitrite, UA: NEGATIVE
RBC, UA: NEGATIVE
Specific Gravity, UA: 1.02 (ref 1.005–1.030)
Urobilinogen, Ur: 2 mg/dL — ABNORMAL HIGH (ref 0.2–1.0)
pH, UA: 7 (ref 5.0–7.5)

## 2022-03-12 LAB — VITAMIN B12: Vitamin B-12: 190 pg/mL — ABNORMAL LOW (ref 232–1245)

## 2022-03-12 LAB — VITAMIN D 25 HYDROXY (VIT D DEFICIENCY, FRACTURES): Vit D, 25-Hydroxy: 20.5 ng/mL — ABNORMAL LOW (ref 30.0–100.0)

## 2022-03-12 MED ORDER — VITAMIN D (ERGOCALCIFEROL) 1.25 MG (50000 UNIT) PO CAPS
50000.0000 [IU] | ORAL_CAPSULE | ORAL | 1 refills | Status: DC
  Filled 2022-03-12: qty 12, 84d supply, fill #0

## 2022-03-12 MED ORDER — VITAMIN B-12 1000 MCG PO TABS
1000.0000 ug | ORAL_TABLET | Freq: Every day | ORAL | 1 refills | Status: DC
  Filled 2022-03-12: qty 90, 90d supply, fill #0

## 2022-03-21 ENCOUNTER — Other Ambulatory Visit: Payer: Self-pay

## 2022-03-27 ENCOUNTER — Other Ambulatory Visit: Payer: Self-pay

## 2022-04-03 ENCOUNTER — Other Ambulatory Visit: Payer: Self-pay

## 2022-04-04 ENCOUNTER — Other Ambulatory Visit: Payer: Self-pay

## 2022-04-19 ENCOUNTER — Other Ambulatory Visit: Payer: Self-pay

## 2022-08-07 ENCOUNTER — Encounter: Payer: Self-pay | Admitting: Family Medicine

## 2022-09-12 ENCOUNTER — Ambulatory Visit: Payer: 59 | Admitting: Family Medicine

## 2022-09-19 ENCOUNTER — Other Ambulatory Visit: Payer: Self-pay

## 2022-09-19 ENCOUNTER — Telehealth (INDEPENDENT_AMBULATORY_CARE_PROVIDER_SITE_OTHER): Payer: 59 | Admitting: Family Medicine

## 2022-09-19 ENCOUNTER — Encounter: Payer: Self-pay | Admitting: Family Medicine

## 2022-09-19 VITALS — BP 100/80 | HR 90 | Temp 97.8°F | Wt 184.0 lb

## 2022-09-19 DIAGNOSIS — E538 Deficiency of other specified B group vitamins: Secondary | ICD-10-CM | POA: Diagnosis not present

## 2022-09-19 DIAGNOSIS — F339 Major depressive disorder, recurrent, unspecified: Secondary | ICD-10-CM

## 2022-09-19 DIAGNOSIS — E559 Vitamin D deficiency, unspecified: Secondary | ICD-10-CM | POA: Diagnosis not present

## 2022-09-19 MED ORDER — CYANOCOBALAMIN 1000 MCG/ML IJ SOLN
INTRAMUSCULAR | 16 refills | Status: AC
Start: 2022-09-19 — End: 2023-09-18
  Filled 2022-09-19: qty 4, 28d supply, fill #0
  Filled 2022-10-03: qty 1, 7d supply, fill #0
  Filled 2022-10-11: qty 5, 77d supply, fill #1

## 2022-09-19 MED ORDER — ERGOCAL 62.5 MCG (2500 UT) PO CAPS
2.0000 | ORAL_CAPSULE | Freq: Every day | ORAL | 3 refills | Status: DC
  Filled 2022-09-19: qty 60, fill #0

## 2022-09-19 MED ORDER — FLUOXETINE HCL 20 MG PO TABS
40.0000 mg | ORAL_TABLET | Freq: Every day | ORAL | 1 refills | Status: DC
  Filled 2022-09-19: qty 180, 90d supply, fill #0
  Filled 2022-10-03: qty 60, 30d supply, fill #0
  Filled 2022-11-12: qty 60, 30d supply, fill #1
  Filled 2022-12-18: qty 60, 30d supply, fill #2
  Filled 2023-02-06: qty 60, 30d supply, fill #3

## 2022-09-19 NOTE — Progress Notes (Signed)
BP 100/80 Comment: unable to obtain  Pulse 90   Temp 97.8 F (36.6 C) (Temporal)   Wt 184 lb (83.5 kg)   LMP 08/28/2022 (Exact Date)   SpO2 98%   BMI 34.77 kg/m    Subjective:    Patient ID: Breanna Lamb, female    DOB: 1990/08/13, 32 y.o.   MRN: [id number]  HPI: Breanna Lamb is a 32 y.o. female  Chief Complaint  Patient presents with   Depression   DEPRESSION Mood status: controlled Satisfied with current treatment?: yes Symptom severity: mild  Duration of current treatment : chronic Side effects: no Medication compliance: excellent compliance Psychotherapy/counseling: no  Previous psychiatric medications: prozac Depressed mood: no Anxious mood: no Anhedonia: no Significant weight loss or gain: no Insomnia: no  Fatigue: no Feelings of worthlessness or guilt: no Impaired concentration/indecisiveness: no Suicidal ideations: no Hopelessness: no Crying spells: no    09/19/2022    4:16 PM 03/11/2022    4:34 PM 08/01/2021   10:56 AM 02/19/2021    4:20 PM 01/05/2021    8:29 AM  Depression screen PHQ 2/9  Decreased Interest 1 1 1 1 2   Down, Depressed, Hopeless 1 1 0 1 1  PHQ - 2 Score 2 2 1 2 3   Altered sleeping 1 3 1 2 2   Tired, decreased energy 1 3 1 2 2   Change in appetite 1 1 0 0 3  Feeling bad or failure about yourself  0 0 0 0 1  Trouble concentrating 1 1 1 1 1   Moving slowly or fidgety/restless 0 0 0 0 0  Suicidal thoughts 0 0 0 0 0  PHQ-9 Score 6 10 4 7 12   Difficult doing work/chores Not difficult at all Somewhat difficult  Not difficult at all Somewhat difficult      09/19/2022    4:17 PM 03/11/2022    4:35 PM 08/01/2021   10:57 AM 02/19/2021    4:21 PM  GAD 7 : Generalized Anxiety Score  Nervous, Anxious, on Edge 1 1 0 1  Control/stop worrying 1 1 0 1  Worry too much - different things 1 1 1 1   Trouble relaxing 1 1 0 1  Restless 0 0 0 0  Easily annoyed or irritable 1 1 1 1   Afraid - awful might happen 1 1 1 1   Total GAD 7 Score 6 6 3 6    Anxiety Difficulty Not difficult at all Somewhat difficult  Not difficult at all       Relevant past medical, surgical, family and social history reviewed and updated as indicated. Interim medical history since our last visit reviewed. Allergies and medications reviewed and updated.  Review of Systems  Constitutional: Negative.   Respiratory: Negative.    Cardiovascular:  Positive for palpitations (with standing). Negative for chest pain and leg swelling.  Gastrointestinal: Negative.   Skin: Negative.   Psychiatric/Behavioral: Negative.      Per HPI unless specifically indicated above     Objective:    BP 100/80 Comment: unable to obtain  Pulse 90   Temp 97.8 F (36.6 C) (Temporal)   Wt 184 lb (83.5 kg)   LMP 08/28/2022 (Exact Date)   SpO2 98%   BMI 34.77 kg/m   Wt Readings from Last 3 Encounters:  09/19/22 184 lb (83.5 kg)  03/11/22 175 lb 14.4 oz (79.8 kg)  01/18/22 174 lb (78.9 kg)    Physical Exam Vitals and nursing note reviewed.  Constitutional:  General: She is not in acute distress.    Appearance: Normal appearance. She is not ill-appearing, toxic-appearing or diaphoretic.  HENT:     Head: Normocephalic and atraumatic.     Right Ear: External ear normal.     Left Ear: External ear normal.     Nose: Nose normal.     Mouth/Throat:     Mouth: Mucous membranes are moist.     Pharynx: Oropharynx is clear.  Eyes:     General: No scleral icterus.       Right eye: No discharge.        Left eye: No discharge.     Conjunctiva/sclera: Conjunctivae normal.     Pupils: Pupils are equal, round, and reactive to light.  Pulmonary:     Effort: Pulmonary effort is normal. No respiratory distress.     Comments: Speaking in full sentences Musculoskeletal:        General: Normal range of motion.     Cervical back: Normal range of motion.  Skin:    Coloration: Skin is not jaundiced or pale.     Findings: No bruising, erythema, lesion or rash.  Neurological:      Mental Status: She is alert and oriented to person, place, and time. Mental status is at baseline.  Psychiatric:        Mood and Affect: Mood normal.        Behavior: Behavior normal.        Thought Content: Thought content normal.        Judgment: Judgment normal.     Results for orders placed or performed in visit on 03/11/22  CBC with Differential/Platelet  Result Value Ref Range   WBC 6.2 3.4 - 10.8 x10E3/uL   RBC 4.51 3.77 - 5.28 x10E6/uL   Hemoglobin 13.0 11.1 - 15.9 g/dL   Hematocrit 59.5 63.8 - 46.6 %   MCV 86 79 - 97 fL   MCH 28.8 26.6 - 33.0 pg   MCHC 33.4 31.5 - 35.7 g/dL   RDW 75.6 43.3 - 29.5 %   Platelets 286 150 - 450 x10E3/uL   Neutrophils 42 Not Estab. %   Lymphs 48 Not Estab. %   Monocytes 7 Not Estab. %   Eos 2 Not Estab. %   Basos 1 Not Estab. %   Neutrophils Absolute 2.6 1.4 - 7.0 x10E3/uL   Lymphocytes Absolute 3.0 0.7 - 3.1 x10E3/uL   Monocytes Absolute 0.4 0.1 - 0.9 x10E3/uL   EOS (ABSOLUTE) 0.1 0.0 - 0.4 x10E3/uL   Basophils Absolute 0.1 0.0 - 0.2 x10E3/uL   Immature Granulocytes 0 Not Estab. %   Immature Grans (Abs) 0.0 0.0 - 0.1 x10E3/uL  Comprehensive metabolic panel  Result Value Ref Range   Glucose 93 70 - 99 mg/dL   BUN 9 6 - 20 mg/dL   Creatinine, Ser 1.88 0.57 - 1.00 mg/dL   eGFR 92 >41 YS/AYT/0.16   BUN/Creatinine Ratio 10 9 - 23   Sodium 139 134 - 144 mmol/L   Potassium 3.9 3.5 - 5.2 mmol/L   Chloride 104 96 - 106 mmol/L   CO2 22 20 - 29 mmol/L   Calcium 9.1 8.7 - 10.2 mg/dL   Total Protein 6.7 6.0 - 8.5 g/dL   Albumin 4.4 4.0 - 5.0 g/dL   Globulin, Total 2.3 1.5 - 4.5 g/dL   Albumin/Globulin Ratio 1.9 1.2 - 2.2   Bilirubin Total 1.3 (H) 0.0 - 1.2 mg/dL   Alkaline Phosphatase 67 44 - 121  IU/L   AST 12 0 - 40 IU/L   ALT 14 0 - 32 IU/L  Lipid Panel w/o Chol/HDL Ratio  Result Value Ref Range   Cholesterol, Total 173 100 - 199 mg/dL   Triglycerides 79 0 - 149 mg/dL   HDL 50 >16 mg/dL   VLDL Cholesterol Cal 15 5 - 40 mg/dL    LDL Chol Calc (NIH) 108 (H) 0 - 99 mg/dL  Urinalysis, Routine w reflex microscopic  Result Value Ref Range   Specific Gravity, UA 1.020 1.005 - 1.030   pH, UA 7.0 5.0 - 7.5   Color, UA Yellow Yellow   Appearance Ur Clear Clear   Leukocytes,UA Negative Negative   Protein,UA Trace (A) Negative/Trace   Glucose, UA Negative Negative   Ketones, UA Trace (A) Negative   RBC, UA Negative Negative   Bilirubin, UA Negative Negative   Urobilinogen, Ur 2.0 (H) 0.2 - 1.0 mg/dL   Nitrite, UA Negative Negative   Microscopic Examination Comment   TSH  Result Value Ref Range   TSH 0.848 0.450 - 4.500 uIU/mL  B12  Result Value Ref Range   Vitamin B-12 190 (L) 232 - 1,245 pg/mL  VITAMIN D 25 Hydroxy (Vit-D Deficiency, Fractures)  Result Value Ref Range   Vit D, 25-Hydroxy 20.5 (L) 30.0 - 100.0 ng/mL      Assessment & Plan:   Problem List Items Addressed This Visit       Other   Depression, recurrent (HCC)    Under good control on current regimen. Continue current regimen. Continue to monitor. Call with any concerns. Refills given.        Relevant Medications   FLUoxetine (PROZAC) 20 MG tablet   Vitamin B12 deficiency - Primary    Will start shots at home. Call with any concerns.       Vitamin D deficiency    Could not remember weekly pills- will start 5000 international units daily.          Follow up plan: Return in about 6 months (around 03/19/2023).   This visit was completed via video visit through MyChart due to the restrictions of the COVID-19 pandemic. All issues as above were discussed and addressed. Physical exam was done as above through visual confirmation on video through MyChart. If it was felt that the patient should be evaluated in the office, they were directed there. The patient verbally consented to this visit. Location of the patient: work Location of the provider: work Those involved with this call:  Provider: Olevia Perches, DO CMA:  Maggie Font,  CMA Front Desk/Registration: Servando Snare  Time spent on call:  15 minutes with patient face to face via video conference. More than 50% of this time was spent in counseling and coordination of care. 23 minutes total spent in review of patient's record and preparation of their chart.

## 2022-09-19 NOTE — Assessment & Plan Note (Signed)
Will start shots at home. Call with any concerns.

## 2022-09-19 NOTE — Assessment & Plan Note (Signed)
Under good control on current regimen. Continue current regimen. Continue to monitor. Call with any concerns. Refills given.   

## 2022-09-19 NOTE — Assessment & Plan Note (Signed)
Could not remember weekly pills- will start 5000 international units daily.

## 2022-09-20 ENCOUNTER — Other Ambulatory Visit: Payer: Self-pay

## 2022-09-20 NOTE — Progress Notes (Signed)
Appointment has been made

## 2022-10-01 ENCOUNTER — Other Ambulatory Visit: Payer: Self-pay

## 2022-10-03 ENCOUNTER — Other Ambulatory Visit: Payer: Self-pay

## 2022-10-11 ENCOUNTER — Other Ambulatory Visit: Payer: Self-pay

## 2022-11-12 ENCOUNTER — Other Ambulatory Visit: Payer: Self-pay

## 2022-12-18 ENCOUNTER — Other Ambulatory Visit: Payer: Self-pay

## 2023-01-14 ENCOUNTER — Telehealth (INDEPENDENT_AMBULATORY_CARE_PROVIDER_SITE_OTHER): Payer: 59 | Admitting: Family Medicine

## 2023-01-14 ENCOUNTER — Encounter: Payer: Self-pay | Admitting: Family Medicine

## 2023-01-14 VITALS — Wt 206.0 lb

## 2023-01-14 DIAGNOSIS — R635 Abnormal weight gain: Secondary | ICD-10-CM | POA: Diagnosis not present

## 2023-01-14 NOTE — Addendum Note (Signed)
 Addended by: Dorcas Carrow on: 01/14/2023 05:00 PM   Modules accepted: Level of Service

## 2023-01-14 NOTE — Progress Notes (Signed)
 Wt 206 lb (93.4 kg)   BMI 38.92 kg/m    Subjective:    Patient ID: Breanna Lamb, female    DOB: 18-May-1990, 33 y.o.   MRN: [id number]  HPI: Breanna Lamb is a 33 y.o. female  Chief Complaint  Patient presents with   Weight Gain    Patient says she has gained about 20-lbs in roughly eight weeks.    WEIGHT GAIN- has been off the wegovy  for about a year, in the past 8 weeks she has gained about 20lbs. Nothing has changed. She is noticing that she has been more tired. No other symptoms Duration: past 8 weeks Previous attempts at weight loss: yes Complications of obesity: none Peak weight: 246lbs Weight loss goal: 180 Weight loss to date: -22lbs Requesting obesity pharmacotherapy: unknown Current weight loss supplements/medications: no Previous weight loss supplements/meds: yes  Relevant past medical, surgical, family and social history reviewed and updated as indicated. Interim medical history since our last visit reviewed. Allergies and medications reviewed and updated.  Review of Systems  Constitutional:  Positive for fatigue and unexpected weight change. Negative for activity change, appetite change, chills, diaphoresis and fever.  Respiratory: Negative.    Cardiovascular: Negative.   Gastrointestinal:  Positive for constipation. Negative for abdominal distention, abdominal pain, anal bleeding, blood in stool, diarrhea, nausea, rectal pain and vomiting.  Endocrine: Positive for cold intolerance. Negative for heat intolerance, polydipsia, polyphagia and polyuria.  Genitourinary: Negative.   Musculoskeletal: Negative.   Neurological: Negative.   Psychiatric/Behavioral: Negative.      Per HPI unless specifically indicated above     Objective:    Wt 206 lb (93.4 kg)   BMI 38.92 kg/m   Wt Readings from Last 3 Encounters:  01/14/23 206 lb (93.4 kg)  09/19/22 184 lb (83.5 kg)  03/11/22 175 lb 14.4 oz (79.8 kg)    Physical Exam Vitals and nursing note reviewed.   Constitutional:      General: She is not in acute distress.    Appearance: Normal appearance. She is not ill-appearing, toxic-appearing or diaphoretic.  HENT:     Head: Normocephalic and atraumatic.     Right Ear: External ear normal.     Left Ear: External ear normal.     Nose: Nose normal.     Mouth/Throat:     Mouth: Mucous membranes are moist.     Pharynx: Oropharynx is clear.  Eyes:     General: No scleral icterus.       Right eye: No discharge.        Left eye: No discharge.     Conjunctiva/sclera: Conjunctivae normal.     Pupils: Pupils are equal, round, and reactive to light.  Pulmonary:     Effort: Pulmonary effort is normal. No respiratory distress.     Comments: Speaking in full sentences Musculoskeletal:        General: Normal range of motion.     Cervical back: Normal range of motion.  Skin:    Coloration: Skin is not jaundiced or pale.     Findings: No bruising, erythema, lesion or rash.  Neurological:     Mental Status: She is alert and oriented to person, place, and time. Mental status is at baseline.  Psychiatric:        Mood and Affect: Mood normal.        Behavior: Behavior normal.        Thought Content: Thought content normal.  Judgment: Judgment normal.     Results for orders placed or performed in visit on 03/11/22  Urinalysis, Routine w reflex microscopic   Collection Time: 03/11/22  4:49 PM  Result Value Ref Range   Specific Gravity, UA 1.020 1.005 - 1.030   pH, UA 7.0 5.0 - 7.5   Color, UA Yellow Yellow   Appearance Ur Clear Clear   Leukocytes,UA Negative Negative   Protein,UA Trace (A) Negative/Trace   Glucose, UA Negative Negative   Ketones, UA Trace (A) Negative   RBC, UA Negative Negative   Bilirubin, UA Negative Negative   Urobilinogen, Ur 2.0 (H) 0.2 - 1.0 mg/dL   Nitrite, UA Negative Negative   Microscopic Examination Comment   CBC with Differential/Platelet   Collection Time: 03/11/22  4:51 PM  Result Value Ref Range    WBC 6.2 3.4 - 10.8 x10E3/uL   RBC 4.51 3.77 - 5.28 x10E6/uL   Hemoglobin 13.0 11.1 - 15.9 g/dL   Hematocrit 61.0 65.9 - 46.6 %   MCV 86 79 - 97 fL   MCH 28.8 26.6 - 33.0 pg   MCHC 33.4 31.5 - 35.7 g/dL   RDW 87.6 88.2 - 84.5 %   Platelets 286 150 - 450 x10E3/uL   Neutrophils 42 Not Estab. %   Lymphs 48 Not Estab. %   Monocytes 7 Not Estab. %   Eos 2 Not Estab. %   Basos 1 Not Estab. %   Neutrophils Absolute 2.6 1.4 - 7.0 x10E3/uL   Lymphocytes Absolute 3.0 0.7 - 3.1 x10E3/uL   Monocytes Absolute 0.4 0.1 - 0.9 x10E3/uL   EOS (ABSOLUTE) 0.1 0.0 - 0.4 x10E3/uL   Basophils Absolute 0.1 0.0 - 0.2 x10E3/uL   Immature Granulocytes 0 Not Estab. %   Immature Grans (Abs) 0.0 0.0 - 0.1 x10E3/uL  Comprehensive metabolic panel   Collection Time: 03/11/22  4:51 PM  Result Value Ref Range   Glucose 93 70 - 99 mg/dL   BUN 9 6 - 20 mg/dL   Creatinine, Ser 9.12 0.57 - 1.00 mg/dL   eGFR 92 >40 fO/fpw/8.26   BUN/Creatinine Ratio 10 9 - 23   Sodium 139 134 - 144 mmol/L   Potassium 3.9 3.5 - 5.2 mmol/L   Chloride 104 96 - 106 mmol/L   CO2 22 20 - 29 mmol/L   Calcium 9.1 8.7 - 10.2 mg/dL   Total Protein 6.7 6.0 - 8.5 g/dL   Albumin 4.4 4.0 - 5.0 g/dL   Globulin, Total 2.3 1.5 - 4.5 g/dL   Albumin/Globulin Ratio 1.9 1.2 - 2.2   Bilirubin Total 1.3 (H) 0.0 - 1.2 mg/dL   Alkaline Phosphatase 67 44 - 121 IU/L   AST 12 0 - 40 IU/L   ALT 14 0 - 32 IU/L  Lipid Panel w/o Chol/HDL Ratio   Collection Time: 03/11/22  4:51 PM  Result Value Ref Range   Cholesterol, Total 173 100 - 199 mg/dL   Triglycerides 79 0 - 149 mg/dL   HDL 50 >60 mg/dL   VLDL Cholesterol Cal 15 5 - 40 mg/dL   LDL Chol Calc (NIH) 891 (H) 0 - 99 mg/dL  TSH   Collection Time: 03/11/22  4:51 PM  Result Value Ref Range   TSH 0.848 0.450 - 4.500 uIU/mL  B12   Collection Time: 03/11/22  4:51 PM  Result Value Ref Range   Vitamin B-12 190 (L) 232 - 1,245 pg/mL  VITAMIN D  25 Hydroxy (Vit-D Deficiency, Fractures)   Collection Time:  03/11/22  4:51 PM  Result Value Ref Range   Vit D, 25-Hydroxy 20.5 (L) 30.0 - 100.0 ng/mL      Assessment & Plan:   Problem List Items Addressed This Visit   None Visit Diagnoses       Weight gain    -  Primary   Will check labs. If normal consider sleep study/contrave . Await results. Treat as needed.   Relevant Orders   CBC with Differential/Platelet   Comprehensive metabolic panel   VITAMIN D  25 Hydroxy (Vit-D Deficiency, Fractures)   B12   Thyroid  Panel With TSH   HgB A1c        Follow up plan: Return for As scheduled for physical.   This visit was completed via video visit through MyChart due to the restrictions of the COVID-19 pandemic. All issues as above were discussed and addressed. Physical exam was done as above through visual confirmation on video through MyChart. If it was felt that the patient should be evaluated in the office, they were directed there. The patient verbally consented to this visit. Location of the patient: home Location of the provider: work Those involved with this call:  Provider: Duwaine Louder, DO CMA: Doyce Croak, CMA Front Desk/Registration:  Claretta Maiden   Time spent on call:  15 minutes with patient face to face via video conference. More than 50% of this time was spent in counseling and coordination of care. 23 minutes total spent in review of patient's record and preparation of their chart.

## 2023-01-16 ENCOUNTER — Other Ambulatory Visit
Admission: RE | Admit: 2023-01-16 | Discharge: 2023-01-16 | Disposition: A | Discharge status: Home / Self Care | Payer: 59 | Attending: Family Medicine | Admitting: Family Medicine

## 2023-01-16 DIAGNOSIS — R635 Abnormal weight gain: Secondary | ICD-10-CM | POA: Diagnosis not present

## 2023-01-16 LAB — CBC WITH DIFFERENTIAL/PLATELET
Abs Immature Granulocytes: 0.01 10*3/uL (ref 0.00–0.07)
Basophils Absolute: 0 10*3/uL (ref 0.0–0.1)
Basophils Relative: 1 %
Eosinophils Absolute: 0.1 10*3/uL (ref 0.0–0.5)
Eosinophils Relative: 2 %
HCT: 40.6 % (ref 36.0–46.0)
Hemoglobin: 13.6 g/dL (ref 12.0–15.0)
Immature Granulocytes: 0 %
Lymphocytes Relative: 39 %
Lymphs Abs: 2.5 10*3/uL (ref 0.7–4.0)
MCH: 28.6 pg (ref 26.0–34.0)
MCHC: 33.5 g/dL (ref 30.0–36.0)
MCV: 85.5 fL (ref 80.0–100.0)
Monocytes Absolute: 0.5 10*3/uL (ref 0.1–1.0)
Monocytes Relative: 8 %
Neutro Abs: 3.2 10*3/uL (ref 1.7–7.7)
Neutrophils Relative %: 50 %
Platelets: 338 10*3/uL (ref 150–400)
RBC: 4.75 MIL/uL (ref 3.87–5.11)
RDW: 11.7 % (ref 11.5–15.5)
WBC: 6.4 10*3/uL (ref 4.0–10.5)
nRBC: 0 % (ref 0.0–0.2)

## 2023-01-16 LAB — COMPREHENSIVE METABOLIC PANEL
ALT: 21 U/L (ref 0–44)
AST: 18 U/L (ref 15–41)
Albumin: 4.2 g/dL (ref 3.5–5.0)
Alkaline Phosphatase: 63 U/L (ref 38–126)
Anion gap: 9 (ref 5–15)
BUN: 15 mg/dL (ref 6–20)
CO2: 23 mmol/L (ref 22–32)
Calcium: 8.9 mg/dL (ref 8.9–10.3)
Chloride: 103 mmol/L (ref 98–111)
Creatinine, Ser: 0.65 mg/dL (ref 0.44–1.00)
GFR, Estimated: >60 mL/min (ref >60)
Glucose, Bld: 77 mg/dL (ref 70–99)
Potassium: 4.2 mmol/L (ref 3.5–5.1)
Sodium: 135 mmol/L (ref 135–145)
Total Bilirubin: 2 mg/dL — ABNORMAL HIGH (ref 0.0–1.2)
Total Protein: 7.5 g/dL (ref 6.5–8.1)

## 2023-01-16 LAB — VITAMIN B12: Vitamin B-12: 381 pg/mL (ref 180–914)

## 2023-01-16 LAB — VITAMIN D 25 HYDROXY (VIT D DEFICIENCY, FRACTURES): Vit D, 25-Hydroxy: 16.75 ng/mL — ABNORMAL LOW (ref 30–100)

## 2023-01-17 LAB — HEMOGLOBIN A1C
Hgb A1c MFr Bld: 5.2 % (ref 4.8–5.6)
Mean Plasma Glucose: 102.54 mg/dL

## 2023-01-18 ENCOUNTER — Encounter: Payer: Self-pay | Admitting: Family Medicine

## 2023-01-18 DIAGNOSIS — R7989 Other specified abnormal findings of blood chemistry: Secondary | ICD-10-CM

## 2023-01-18 LAB — THYROID PANEL WITH TSH
Free Thyroxine Index: 2.4 (ref 1.2–4.9)
T3 Uptake Ratio: 28 % (ref 24–39)
T4, Total: 8.7 ug/dL (ref 4.5–12.0)
TSH: 1.3 u[IU]/mL (ref 0.450–4.500)

## 2023-01-19 ENCOUNTER — Other Ambulatory Visit: Payer: Self-pay | Admitting: Family Medicine

## 2023-01-19 MED ORDER — VITAMIN D (ERGOCALCIFEROL) 1.25 MG (50000 UNIT) PO CAPS
50000.0000 [IU] | ORAL_CAPSULE | ORAL | 1 refills | Status: AC
  Filled 2023-01-19: qty 12, 84d supply, fill #0

## 2023-01-20 ENCOUNTER — Other Ambulatory Visit: Payer: Self-pay

## 2023-01-21 ENCOUNTER — Other Ambulatory Visit: Payer: Self-pay

## 2023-02-04 ENCOUNTER — Encounter: Payer: Self-pay | Admitting: Family Medicine

## 2023-02-06 ENCOUNTER — Other Ambulatory Visit: Payer: Self-pay

## 2023-02-16 MED ORDER — CONTRAVE 8-90 MG PO TB12
ORAL_TABLET | ORAL | 1 refills | Status: DC
  Filled 2023-02-16: qty 120, 30d supply, fill #0
  Filled 2023-02-25: qty 120, 47d supply, fill #0

## 2023-02-17 ENCOUNTER — Other Ambulatory Visit: Payer: Self-pay

## 2023-02-19 ENCOUNTER — Other Ambulatory Visit: Payer: Self-pay

## 2023-02-19 ENCOUNTER — Other Ambulatory Visit: Payer: Self-pay | Admitting: Pulmonary Disease

## 2023-02-19 MED ORDER — OSELTAMIVIR PHOSPHATE 75 MG PO CAPS
75.0000 mg | ORAL_CAPSULE | Freq: Two times a day (BID) | ORAL | 0 refills | Status: DC
  Filled 2023-02-19: qty 14, 7d supply, fill #0

## 2023-02-19 NOTE — Progress Notes (Signed)
Tamiflu for exposure to flu a prophylaxis  C. Danice Goltz, MD Advanced Bronchoscopy PCCM [facility]

## 2023-02-20 ENCOUNTER — Telehealth: Payer: Self-pay

## 2023-02-20 NOTE — Telephone Encounter (Signed)
PA for Contrave initiated and submitted via Cover My Meds. Key: [id number]

## 2023-02-24 ENCOUNTER — Telehealth: Payer: 59 | Admitting: Physician Assistant

## 2023-02-24 DIAGNOSIS — R0789 Other chest pain: Secondary | ICD-10-CM

## 2023-02-24 DIAGNOSIS — R079 Chest pain, unspecified: Secondary | ICD-10-CM | POA: Diagnosis not present

## 2023-02-24 DIAGNOSIS — J189 Pneumonia, unspecified organism: Secondary | ICD-10-CM | POA: Diagnosis not present

## 2023-02-24 DIAGNOSIS — J069 Acute upper respiratory infection, unspecified: Secondary | ICD-10-CM

## 2023-02-24 DIAGNOSIS — R042 Hemoptysis: Secondary | ICD-10-CM

## 2023-02-24 NOTE — Patient Instructions (Signed)
Rosaland Lao, thank you for joining Laure Kidney, PA-C for today's virtual visit.  While this provider is not your primary care provider (PCP), if your PCP is located in our provider database this encounter information will be shared with them immediately following your visit.   A [facility] MyChart account gives you access to today's visit and all your visits, tests, and labs performed at [facility] " click here if you don't have a [facility] MyChart account or go to [email or web address]  Consent: (Patient) Rosaland Lao provided verbal consent for this virtual visit at the beginning of the encounter.  Current Medications:  Current Outpatient Medications:    oseltamivir (TAMIFLU) 75 MG capsule, Take 1 capsule (75 mg total) by mouth 2 (two) times daily for 7 days., Disp: 14 capsule, Rfl: 0   acetaminophen (TYLENOL) 325 MG tablet, Take 650 mg by mouth every 6 (six) hours as needed for moderate pain., Disp: , Rfl:    albuterol (VENTOLIN HFA) 108 (90 Base) MCG/ACT inhaler, Inhale 2 puffs into the lungs every 6 (six) hours as needed., Disp: 6.7 g, Rfl: 2   Albuterol-Budesonide (AIRSUPRA) 90-80 MCG/ACT AERO, Inhale 2 puffs into the lungs every 4 (four) hours as needed., Disp: 5.9 g, Rfl: 0   cyanocobalamin (VITAMIN B12) 1000 MCG/ML injection, Inject 1 mL (1,000 mcg total) into the skin once a week for 28 days, THEN 1 mL (1,000 mcg total) every 30 (thirty) days., Disp: 1 mL, Rfl: 16   FLUoxetine (PROZAC) 20 MG tablet, Take 1 to 2 tablets daily for 1 to 2 weeks THEN increase to 2 tablets (40 mg total) by mouth daily., Disp: 180 tablet, Rfl: 1   Insulin Pen Needle (PEN NEEDLES 31GX5/16") 31G X 8 MM MISC, use daily., Disp: 100 each, Rfl: 12   Naltrexone-buPROPion HCl ER (CONTRAVE) 8-90 MG TB12, Start 1 tablet every morning for 7 days, then 1 tablet twice daily for 7 days, then 2 tablets every morning and one every evening, Disp: 120 tablet, Rfl: 1   naproxen (NAPROSYN) 500 MG  tablet, Take 1 tablet (500 mg total) by mouth 2 (two) times daily with a meal., Disp: 60 tablet, Rfl: 2   ondansetron (ZOFRAN) 4 MG tablet, Take 1 tablet (4 mg total) by mouth every 8 (eight) hours as needed for nausea or vomiting., Disp: 20 tablet, Rfl: 3   Vitamin D, Ergocalciferol, (DRISDOL) 1.25 MG (50000 UNIT) CAPS capsule, Take 1 capsule (50,000 Units total) by mouth every 7 (seven) days., Disp: 12 capsule, Rfl: 1   Medications ordered in this encounter:  No orders of the defined types were placed in this encounter.    *If you need refills on other medications prior to your next appointment, please contact your pharmacy*  Follow-Up: Call back or seek an in-person evaluation if the symptoms worsen or if the condition fails to improve as anticipated.  [facility] [phone]  Other Instructions Report to ER or Urgent Care for evaluation.    If you have been instructed to have an in-person evaluation today at a local Urgent Care facility, please use the link below. It will take you to a list of all of our available [facility], including address, phone number and hours of operation. Please do not delay care.   Urgent Cares  If you or a family member do not have a primary care provider, use the link below to schedule a visit and establish care. When you choose a  [facility] primary care physician or advanced practice provider, you gain a long-term partner in health. Find a Primary Care Provider  Learn more about [facility]'s in-office and virtual care options: [facility] - Get Care Now

## 2023-02-24 NOTE — Progress Notes (Signed)
Virtual Visit Consent   Rosaland Lao, you are scheduled for a virtual visit with a [facility] provider today. Just as with appointments in the office, your consent must be obtained to participate. Your consent will be active for this visit and any virtual visit you may have with one of our providers in the next 365 days. If you have a MyChart account, a copy of this consent can be sent to you electronically.  As this is a virtual visit, video technology does not allow for your provider to perform a traditional examination. This may limit your provider's ability to fully assess your condition. If your provider identifies any concerns that need to be evaluated in person or the need to arrange testing (such as labs, EKG, etc.), we will make arrangements to do so. Although advances in technology are sophisticated, we cannot ensure that it will always work on either your end or our end. If the connection with a video visit is poor, the visit may have to be switched to a telephone visit. With either a video or telephone visit, we are not always able to ensure that we have a secure connection.  By engaging in this virtual visit, you consent to the provision of healthcare and authorize for your insurance to be billed (if applicable) for the services provided during this visit. Depending on your insurance coverage, you may receive a charge related to this service.  I need to obtain your verbal consent now. Are you willing to proceed with your visit today? Breanna Lamb has provided verbal consent on 02/24/2023 for a virtual visit (video or telephone). Laure Kidney, [place]  Date: 02/24/2023 1:32 PM   Virtual Visit via Video Note   I, Laure Kidney, connected with  Breanna Lamb  ([id number], 1990-04-21) on 02/24/23 at  1:30 PM EST by a video-enabled telemedicine application and verified that I am speaking with the correct person using two identifiers.  Location: Patient: Virtual Visit Location Patient:  Home Provider: Virtual Visit Location Provider: Home Office   I discussed the limitations of evaluation and management by telemedicine and the availability of in person appointments. The patient expressed understanding and agreed to proceed.    History of Present Illness: Breanna Lamb is a 33 y.o. who identifies as a female who was assigned female at birth, and is being seen today for URI/flu like symptoms.Marland Kitchen  HPI: Influenza This is a new problem. The current episode started today. The problem occurs constantly. Associated symptoms include congestion, coughing and a sore throat. Nothing aggravates the symptoms. She has tried rest for the symptoms.    Problems:  Patient Active Problem List   Diagnosis Date Noted   Vitamin B12 deficiency 09/19/2022   Vitamin D deficiency 09/19/2022   Depression, recurrent (HCC) 01/03/2020   History of morbid obesity 01/03/2020   Chronic tension-type headache, not intractable 01/31/2016    Allergies:  Allergies  Allergen Reactions   Nickel Rash   Medications:  Current Outpatient Medications:    oseltamivir (TAMIFLU) 75 MG capsule, Take 1 capsule (75 mg total) by mouth 2 (two) times daily for 7 days., Disp: 14 capsule, Rfl: 0   acetaminophen (TYLENOL) 325 MG tablet, Take 650 mg by mouth every 6 (six) hours as needed for moderate pain., Disp: , Rfl:    albuterol (VENTOLIN HFA) 108 (90 Base) MCG/ACT inhaler, Inhale 2 puffs into the lungs every 6 (six) hours as needed., Disp: 6.7 g, Rfl: 2   Albuterol-Budesonide (AIRSUPRA) 90-80 MCG/ACT  AERO, Inhale 2 puffs into the lungs every 4 (four) hours as needed., Disp: 5.9 g, Rfl: 0   cyanocobalamin (VITAMIN B12) 1000 MCG/ML injection, Inject 1 mL (1,000 mcg total) into the skin once a week for 28 days, THEN 1 mL (1,000 mcg total) every 30 (thirty) days., Disp: 1 mL, Rfl: 16   FLUoxetine (PROZAC) 20 MG tablet, Take 1 to 2 tablets daily for 1 to 2 weeks THEN increase to 2 tablets (40 mg total) by mouth daily., Disp:  180 tablet, Rfl: 1   Insulin Pen Needle (PEN NEEDLES 31GX5/16") 31G X 8 MM MISC, use daily., Disp: 100 each, Rfl: 12   Naltrexone-buPROPion HCl ER (CONTRAVE) 8-90 MG TB12, Start 1 tablet every morning for 7 days, then 1 tablet twice daily for 7 days, then 2 tablets every morning and one every evening, Disp: 120 tablet, Rfl: 1   naproxen (NAPROSYN) 500 MG tablet, Take 1 tablet (500 mg total) by mouth 2 (two) times daily with a meal., Disp: 60 tablet, Rfl: 2   ondansetron (ZOFRAN) 4 MG tablet, Take 1 tablet (4 mg total) by mouth every 8 (eight) hours as needed for nausea or vomiting., Disp: 20 tablet, Rfl: 3   Vitamin D, Ergocalciferol, (DRISDOL) 1.25 MG (50000 UNIT) CAPS capsule, Take 1 capsule (50,000 Units total) by mouth every 7 (seven) days., Disp: 12 capsule, Rfl: 1  Observations/Objective: Patient is well-developed, well-nourished in no acute distress.  Resting comfortably  at home.  Head is normocephalic, atraumatic.  No labored breathing.  Speech is clear and coherent with logical content.  Patient is alert and oriented at baseline.    Assessment and Plan: 1. Chest tightness (Primary)  2. Blood-streaked sputum  3. Upper respiratory tract infection, unspecified type  Patient presenting with what sounds like an onoing URI however she has now also developed chest rteightness and blood streaked sputum. Differential certanly includes pulomnary embolism vs PNA vs ongoign uRI. Recommending in person assessment as she has had these symptoms almost 1 week at this time. She verbalized understnianf and is in agreement with this plan.   Follow Up Instructions: I discussed the assessment and treatment plan with the patient. The patient was provided an opportunity to ask questions and all were answered. The patient agreed with the plan and demonstrated an understanding of the instructions.  A copy of instructions were sent to the patient via MyChart unless otherwise noted below.     The  patient was advised to call back or seek an in-person evaluation if the symptoms worsen or if the condition fails to improve as anticipated.    Laure Kidney, PA-C

## 2023-02-25 ENCOUNTER — Other Ambulatory Visit: Payer: Self-pay

## 2023-02-25 DIAGNOSIS — R079 Chest pain, unspecified: Secondary | ICD-10-CM | POA: Diagnosis not present

## 2023-02-26 DIAGNOSIS — R079 Chest pain, unspecified: Secondary | ICD-10-CM | POA: Diagnosis not present

## 2023-02-28 ENCOUNTER — Other Ambulatory Visit: Payer: Self-pay

## 2023-02-28 ENCOUNTER — Encounter: Payer: Self-pay | Admitting: Adult Health

## 2023-02-28 ENCOUNTER — Ambulatory Visit: Payer: 59

## 2023-02-28 ENCOUNTER — Ambulatory Visit: Payer: 59 | Admitting: Adult Health

## 2023-02-28 VITALS — BP 106/80 | HR 80 | Temp 97.9°F | Ht 61.0 in | Wt 200.0 lb

## 2023-02-28 DIAGNOSIS — J4521 Mild intermittent asthma with (acute) exacerbation: Secondary | ICD-10-CM | POA: Diagnosis not present

## 2023-02-28 DIAGNOSIS — J09X2 Influenza due to identified novel influenza A virus with other respiratory manifestations: Secondary | ICD-10-CM

## 2023-02-28 DIAGNOSIS — J111 Influenza due to unidentified influenza virus with other respiratory manifestations: Secondary | ICD-10-CM

## 2023-02-28 DIAGNOSIS — R059 Cough, unspecified: Secondary | ICD-10-CM | POA: Diagnosis not present

## 2023-02-28 DIAGNOSIS — J45901 Unspecified asthma with (acute) exacerbation: Secondary | ICD-10-CM | POA: Insufficient documentation

## 2023-02-28 MED ORDER — METHYLPREDNISOLONE ACETATE 80 MG/ML IJ SUSP
80.0000 mg | Freq: Once | INTRAMUSCULAR | Status: AC
  Administered 2023-02-28: 80 mg via INTRAMUSCULAR

## 2023-02-28 MED ORDER — HYDROCODONE BIT-HOMATROP MBR 5-1.5 MG/5ML PO SOLN
5.0000 mL | Freq: Four times a day (QID) | ORAL | 0 refills | Status: DC | PRN
  Filled 2023-02-28: qty 120, 6d supply, fill #0

## 2023-02-28 MED ORDER — PREDNISONE 10 MG PO TABS
ORAL_TABLET | ORAL | 0 refills | Status: DC
Start: 2023-02-28 — End: 2023-03-21
  Filled 2023-02-28: qty 20, 8d supply, fill #0

## 2023-02-28 NOTE — Patient Instructions (Signed)
Finish Augmentin and Zpack as directed  Depo Medrol shot  Begin Prednisone tomorrow as directed.  Delsym 2 tsp Twice daily  As needed  cough  Tessalon Three times a day  As needed  cough  Hydromet 1 tsp At bedtime  As needed  for severe cough  Fluids and rest  Saline nasal spray Twice daily   Allegra 180mg  daily As needed  drainage  Follow up with Dr. Jayme Cloud or Arienna Benegas NP in 6 weeks and As needed   Please contact office for sooner follow up if symptoms do not improve or worsen or seek emergency care

## 2023-02-28 NOTE — Assessment & Plan Note (Signed)
Acute asthmatic bronchitic exacerbation in the setting of influenza A-patient is prone to mild intermittent asthma with acute viral/URI infections. Will have her complete antibiotics as recommended.  Begin prednisone taper over the next week.  Albuterol nebulizer treatment recommended.  Depo-Medrol 80 mg IM injection.  Begin prednisone oral taper tomorrow on March 01, 2023.  Cough control regimen with liquid Mucinex DM or Delsym with Tessalon Perles.  May use Hydromet for severe coughing paroxysms.  Supportive care along with fluids rest and advancement of activity. Airsupra as needed Had [employer].  Would recommend spirometry/PFTs going forward.  Plan  Patient Instructions  Finish Augmentin and Zpack as directed  Depo Medrol shot  Begin Prednisone tomorrow as directed.  Delsym 2 tsp Twice daily  As needed  cough  Tessalon Three times a day  As needed  cough  Hydromet 1 tsp At bedtime  As needed  for severe cough  Fluids and rest  Saline nasal spray Twice daily   Allegra 180mg  daily As needed  drainage  Follow up with Dr. Jayme Cloud or Deanta Mincey NP in 6 weeks and As needed   Please contact office for sooner follow up if symptoms do not improve or worsen or seek emergency care   '

## 2023-02-28 NOTE — Progress Notes (Signed)
@Patient  ID: Breanna Lamb, female    DOB: [birth year], 33 y.o.   MRN: [id number]  Chief Complaint  Patient presents with   Acute Visit    Referring provider: Dorcas Carrow, DO  HPI: 33 year old female never smoker seen for pulmonary consult February 28, 2023 for cough, wheezing, shortness of breath in the setting of influenza A History of childhood asthma.   TEST/EVENTS :   02/28/2023 Consult  Patient presents for a consult.  Patient complains that approximately 10 days ago she developed acute symptoms of cough, body aches, sore throat fever, nasal congestion.  Patient tested positive for flu A.  Was exposed at work from sick patient . Patient complains that she continued to have ongoing symptoms with severe coughing paroxysms.  She was seen in the emergency room at [facility] on 02/24/23 .  Flu swab was positive for influenza A.  Chest x-ray was clear.  Patient was felt to have a possible early pneumonia based on her symptomology.  She was given Augmentin and azithromycin.  Given Tessalon for cough control.  Patient complains that she continues to have ongoing coughing and intermittent wheezing.  Does have asthma-like symptoms when she gets viral illnesses.  Also has nasal congestion and drainage.  Appetite is okay with no nausea vomiting or diarrhea. Had childhood asthma. Occasional uses Airsupra .  Feno today <5. Has  1 indoor pets. Both kids have flu. Children have asthma. No basement. Did go to [place] in January 2025. Did have some blood tinged mucus . No further episodes. D Dimer was negative. Left pleuritic back pain from coughing so much.  Has few days left of both abx.  Chest x-ray today is clear.  SH: Married , 3 kids, never smoker.   Past Surgical History:  Procedure Laterality Date   BUNIONECTOMY  2010 and 2011   CESAREAN SECTION     CESAREAN SECTION WITH BILATERAL TUBAL LIGATION  04/08/2019   Procedure: CESAREAN SECTION WITH BILATERAL TUBAL LIGATION;  Surgeon: Vena Austria, MD;  Location: [facility] ORS;  Service: Obstetrics;;   mirena IUD  01/2014   OVARIAN CYST REMOVAL  August 2014   TUBAL LIGATION  04/2019   WISDOM TOOTH EXTRACTION  2014     Allergies  Allergen Reactions   Nickel Rash    Immunization History  Administered Date(s) Administered   DTaP 09/07/1991, 11/12/1991, 01/31/1992, 08/18/1996   HIB (PRP-OMP) 09/07/1991, 11/12/1991, 01/31/1992   HPV Bivalent 10/31/2005, 04/07/2006, 12/22/2007   Hepatitis B 08/18/1996   IPV 09/07/1991, 11/12/1991, 08/18/1996   Influenza,inj,Quad PF,6+ Mos 11/25/2018, 10/23/2022   Influenza-Unspecified 10/22/2015, 11/02/2019, 10/17/2021   MMR 08/18/1996   Meningococcal polysaccharide vaccine (MPSV4) 12/22/2007   PFIZER(Purple Top)SARS-COV-2 Vaccination 09/16/2019, 10/06/2019   Tdap 12/22/2007, 08/07/2013, 02/08/2019    Past Medical History:  Diagnosis Date   Allergic rhinitis    Asthma    childhood   Benign positional vertigo    Bunion    Contraceptive management    Depression    Genital herpes    Genital herpes simplex virus (HSV) infection in mother affecting pregnancy 07/29/2018   Henoch-Schonlein purpura (HCC)    Low-lying placenta 11/19/2018   Resolved 23 week anatomy follow up   Lymphadenopathy    Neck pain    Obesity    Ovarian cyst, right    Poor concentration    Premenstrual dysphoric syndrome    Premenstrual dysphoric syndrome    Screening for venereal disease    Skin lesion  Tobacco History: Social History   Tobacco Use  Smoking Status Never  Smokeless Tobacco Never   Counseling given: Not Answered   Outpatient Medications Prior to Visit  Medication Sig Dispense Refill   acetaminophen (TYLENOL) 325 MG tablet Take 650 mg by mouth every 6 (six) hours as needed for moderate pain.     Albuterol-Budesonide (AIRSUPRA) 90-80 MCG/ACT AERO Inhale 2 puffs into the lungs every 4 (four) hours as needed. 5.9 g 0   amoxicillin-clavulanate (AUGMENTIN) 875-125 MG tablet Take 1 tablet  by mouth 2 (two) times daily.     azithromycin (ZITHROMAX) 250 MG tablet Take 250 mg by mouth daily.     benzonatate (TESSALON) 200 MG capsule Take by mouth.     cyanocobalamin (VITAMIN B12) 1000 MCG/ML injection Inject 1 mL (1,000 mcg total) into the skin once a week for 28 days, THEN 1 mL (1,000 mcg total) every 30 (thirty) days. 1 mL 16   FLUoxetine (PROZAC) 20 MG tablet Take 1 to 2 tablets daily for 1 to 2 weeks THEN increase to 2 tablets (40 mg total) by mouth daily. 180 tablet 1   Insulin Pen Needle (PEN NEEDLES 31GX5/16") 31G X 8 MM MISC use daily. 100 each 12   ondansetron (ZOFRAN) 4 MG tablet Take 1 tablet (4 mg total) by mouth every 8 (eight) hours as needed for nausea or vomiting. 20 tablet 3   Vitamin D, Ergocalciferol, (DRISDOL) 1.25 MG (50000 UNIT) CAPS capsule Take 1 capsule (50,000 Units total) by mouth every 7 (seven) days. 12 capsule 1   Naltrexone-buPROPion HCl ER (CONTRAVE) 8-90 MG TB12 Start 1 tablet every morning for 7 days, then 1 tablet twice daily for 7 days, then 2 tablets every morning and one every evening (Patient not taking: Reported on 02/28/2023) 120 tablet 1   albuterol (VENTOLIN HFA) 108 (90 Base) MCG/ACT inhaler Inhale 2 puffs into the lungs every 6 (six) hours as needed. (Patient not taking: Reported on 02/28/2023) 6.7 g 2   naproxen (NAPROSYN) 500 MG tablet Take 1 tablet (500 mg total) by mouth 2 (two) times daily with a meal. (Patient not taking: Reported on 02/28/2023) 60 tablet 2   oseltamivir (TAMIFLU) 75 MG capsule Take 1 capsule (75 mg total) by mouth 2 (two) times daily for 7 days. (Patient not taking: Reported on 02/28/2023) 14 capsule 0   No facility-administered medications prior to visit.     Review of Systems:   Constitutional:   No  weight loss, night sweats,  Fevers, chills,  +fatigue, or  lassitude.  HEENT:   No headaches,  Difficulty swallowing,  Tooth/dental problems, or  Sore throat,                No sneezing, itching, ear ache,+ nasal  congestion, post nasal drip,   CV:  No chest pain,  Orthopnea, PND, swelling in lower extremities, anasarca, dizziness, palpitations, syncope.   GI  No heartburn, indigestion, abdominal pain, nausea, vomiting, diarrhea, change in bowel habits, loss of appetite, bloody stools.   Resp:   No chest wall deformity  Skin: no rash or lesions.  GU: no dysuria, change in color of urine, no urgency or frequency.  No flank pain, no hematuria   MS:  No joint pain or swelling.  No decreased range of motion.  No back pain.    Physical Exam  BP 106/80 (BP Location: Left Arm, Patient Position: Sitting, Cuff Size: Large)   Pulse 80   Temp 97.9 F (36.6 C) (Oral)  Ht 5\' 1"  (1.549 m)   Wt 200 lb (90.7 kg)   SpO2 99%   BMI 37.79 kg/m   GEN: A/Ox3; pleasant , NAD, well nourished    HEENT:  [place]/AT,  EACs-clear, TMs-wnl, NOSE-clear, THROAT-clear, no lesions, no postnasal drip or exudate noted.   NECK:  Supple w/ fair ROM; no JVD; normal carotid impulses w/o bruits; no thyromegaly or nodules palpated; no lymphadenopathy.    RESP  Clear  P & A; w/o, wheezes/ rales/ or rhonchi. no accessory muscle use, no dullness to percussion  CARD:  RRR, no m/r/g, no peripheral edema, pulses intact, no cyanosis or clubbing.  GI:   Soft & nt; nml bowel sounds; no organomegaly or masses detected.   Musco: Warm bil, no deformities or joint swelling noted.   Neuro: alert, no focal deficits noted.    Skin: Warm, no lesions or rashes    Lab Results:  CBC    Component Value Date/Time   WBC 6.4 01/16/2023 1305   RBC 4.75 01/16/2023 1305   HGB 13.6 01/16/2023 1305   HGB 13.0 03/11/2022 1651   HCT 40.6 01/16/2023 1305   HCT 38.9 03/11/2022 1651   PLT 338 01/16/2023 1305   PLT 286 03/11/2022 1651   MCV 85.5 01/16/2023 1305   MCV 86 03/11/2022 1651   MCV 86 10/24/2013 0951   MCH 28.6 01/16/2023 1305   MCHC 33.5 01/16/2023 1305   RDW 11.7 01/16/2023 1305   RDW 12.3 03/11/2022 1651   RDW 13.5  10/24/2013 0951   LYMPHSABS 2.5 01/16/2023 1305   LYMPHSABS 3.0 03/11/2022 1651   LYMPHSABS 2.5 10/05/2013 1854   MONOABS 0.5 01/16/2023 1305   MONOABS 1.0 (H) 10/05/2013 1854   EOSABS 0.1 01/16/2023 1305   EOSABS 0.1 03/11/2022 1651   EOSABS 0.1 10/05/2013 1854   BASOSABS 0.0 01/16/2023 1305   BASOSABS 0.1 03/11/2022 1651   BASOSABS 0.1 10/05/2013 1854    BMET    Component Value Date/Time   NA 135 01/16/2023 1305   NA 139 03/11/2022 1651   NA 139 10/24/2013 0951   K 4.2 01/16/2023 1305   K 4.0 10/24/2013 0951   CL 103 01/16/2023 1305   CL 107 10/24/2013 0951   CO2 23 01/16/2023 1305   CO2 23 10/24/2013 0951   GLUCOSE 77 01/16/2023 1305   GLUCOSE 99 10/24/2013 0951   BUN 15 01/16/2023 1305   BUN 9 03/11/2022 1651   BUN 9 10/24/2013 0951   CREATININE 0.65 01/16/2023 1305   CREATININE 0.86 10/24/2013 0951   CALCIUM 8.9 01/16/2023 1305   CALCIUM 8.4 (L) 10/24/2013 0951   GFRNONAA >60 01/16/2023 1305   GFRNONAA >60 10/24/2013 0951   GFRNONAA >60 09/05/2012 0950   GFRAA >60 03/26/2019 1922   GFRAA >60 10/24/2013 0951   GFRAA >60 09/05/2012 0950    BNP    Component Value Date/Time   BNP 25.0 03/27/2019 0003    ProBNP No results found for: "PROBNP"  Imaging: DG Chest 2 View Result Date: 02/28/2023 CLINICAL DATA:  Cough. EXAM: CHEST - 2 VIEW COMPARISON:  March 24, 2019. FINDINGS: The heart size and mediastinal contours are within normal limits. Both lungs are clear. The visualized skeletal structures are unremarkable. IMPRESSION: No active cardiopulmonary disease. Electronically Signed   By: Lupita Raider M.D.   On: 02/28/2023 12:30    methylPREDNISolone acetate (DEPO-MEDROL) injection 80 mg     Date Action Dose Route User   02/28/2023 1132 Given 80 mg Intramuscular (Left  Deltoid) Ellin Saba, Ledell Peoples, RN           No data to display          No results found for: "NITRICOXIDE"      Assessment & Plan:   Asthmatic bronchitis with acute  exacerbation Acute asthmatic bronchitic exacerbation in the setting of influenza A-patient is prone to mild intermittent asthma with acute viral/URI infections. Will have her complete antibiotics as recommended.  Begin prednisone taper over the next week.  Albuterol nebulizer treatment recommended.  Depo-Medrol 80 mg IM injection.  Begin prednisone oral taper tomorrow on March 01, 2023.  Cough control regimen with liquid Mucinex DM or Delsym with Tessalon Perles.  May use Hydromet for severe coughing paroxysms.  Supportive care along with fluids rest and advancement of activity. Airsupra as needed Had [employer].  Would recommend spirometry/PFTs going forward.  Plan  Patient Instructions  Finish Augmentin and Zpack as directed  Depo Medrol shot  Begin Prednisone tomorrow as directed.  Delsym 2 tsp Twice daily  As needed  cough  Tessalon Three times a day  As needed  cough  Hydromet 1 tsp At bedtime  As needed  for severe cough  Fluids and rest  Saline nasal spray Twice daily   Allegra 180mg  daily As needed  drainage  Follow up with Dr. Jayme Cloud or Carmesha Morocco NP in 6 weeks and As needed   Please contact office for sooner follow up if symptoms do not improve or worsen or seek emergency care   '     Ronesha Heenan, NP 02/28/2023

## 2023-03-21 ENCOUNTER — Ambulatory Visit (INDEPENDENT_AMBULATORY_CARE_PROVIDER_SITE_OTHER): Payer: 59 | Admitting: Family Medicine

## 2023-03-21 ENCOUNTER — Other Ambulatory Visit: Payer: Self-pay

## 2023-03-21 ENCOUNTER — Encounter: Payer: Self-pay | Admitting: Family Medicine

## 2023-03-21 VITALS — BP 109/70 | HR 81 | Temp 98.4°F | Resp 16 | Ht 62.76 in | Wt 220.0 lb

## 2023-03-21 DIAGNOSIS — L680 Hirsutism: Secondary | ICD-10-CM

## 2023-03-21 DIAGNOSIS — F339 Major depressive disorder, recurrent, unspecified: Secondary | ICD-10-CM

## 2023-03-21 DIAGNOSIS — Z0000 Encounter for general adult medical examination without abnormal findings: Secondary | ICD-10-CM | POA: Diagnosis not present

## 2023-03-21 MED ORDER — FLUOXETINE HCL 20 MG PO TABS
60.0000 mg | ORAL_TABLET | Freq: Every day | ORAL | 1 refills | Status: DC
  Filled 2023-03-21: qty 270, 90d supply, fill #0
  Filled 2023-07-28 – 2023-08-29 (×2): qty 270, 90d supply, fill #1

## 2023-03-21 MED ORDER — METFORMIN HCL ER 500 MG PO TB24
ORAL_TABLET | ORAL | 2 refills | Status: DC
  Filled 2023-03-21: qty 180, 90d supply, fill #0

## 2023-03-21 NOTE — Patient Instructions (Signed)
 Take 2 in the AM and 1 in the PM for 1 week, then 1 BID for 1 week, then 1 in the AM for 1 week, then stop

## 2023-03-21 NOTE — Progress Notes (Signed)
 BP 109/70 (BP Location: Left Arm, Patient Position: Sitting, Cuff Size: Large)   Pulse 81   Temp 98.4 F (36.9 C) (Oral)   Resp 16   Ht 5' 2.76" (1.594 m)   Wt 220 lb (99.8 kg)   LMP 03/03/2023 (Exact Date)   BMI 39.27 kg/m    Subjective:    Patient ID: Breanna Lamb, female    DOB: 11-14-1990, 33 y.o.   MRN: [id number]  HPI: LIV RALLIS is a 33 y.o. female presenting on 03/21/2023 for comprehensive medical examination. Current medical complaints include:  WEIGHT GAIN Duration: several months Previous attempts at weight loss: yes Complications of obesity: Depression Peak weight: 246lbs Weight loss goal: 180 Weight loss to date: 26lbs Requesting obesity pharmacotherapy: yes Current weight loss supplements/medications: yes- contrave (doesn't seem to be working) Previous weight loss supplements/meds: yes wegovy (not covered by insurance)  DEPRESSION Mood status: exacerbated Satisfied with current treatment?: no Symptom severity: moderate  Duration of current treatment : chronic Side effects: no Medication compliance: excellent compliance Psychotherapy/counseling: no  Previous psychiatric medications: fluoxetine Depressed mood: yes Anxious mood: yes Anhedonia: no Significant weight loss or gain: yes Insomnia: no  Fatigue: yes Feelings of worthlessness or guilt: yes Impaired concentration/indecisiveness: no Suicidal ideations: no Hopelessness: no Crying spells: no    09/19/2022    4:16 PM 03/11/2022    4:34 PM 08/01/2021   10:56 AM 02/19/2021    4:20 PM 01/05/2021    8:29 AM  Depression screen PHQ 2/9  Decreased Interest 1 1 1 1 2   Down, Depressed, Hopeless 1 1 0 1 1  PHQ - 2 Score 2 2 1 2 3   Altered sleeping 1 3 1 2 2   Tired, decreased energy 1 3 1 2 2   Change in appetite 1 1 0 0 3  Feeling bad or failure about yourself  0 0 0 0 1  Trouble concentrating 1 1 1 1 1   Moving slowly or fidgety/restless 0 0 0 0 0  Suicidal thoughts 0 0 0 0 0  PHQ-9 Score 6 10  4 7 12   Difficult doing work/chores Not difficult at all Somewhat difficult  Not difficult at all Somewhat difficult    She currently lives with: husband and kids Menopausal Symptoms: no  Depression Screen done today and results listed below:     09/19/2022    4:16 PM 03/11/2022    4:34 PM 08/01/2021   10:56 AM 02/19/2021    4:20 PM 01/05/2021    8:29 AM  Depression screen PHQ 2/9  Decreased Interest 1 1 1 1 2   Down, Depressed, Hopeless 1 1 0 1 1  PHQ - 2 Score 2 2 1 2 3   Altered sleeping 1 3 1 2 2   Tired, decreased energy 1 3 1 2 2   Change in appetite 1 1 0 0 3  Feeling bad or failure about yourself  0 0 0 0 1  Trouble concentrating 1 1 1 1 1   Moving slowly or fidgety/restless 0 0 0 0 0  Suicidal thoughts 0 0 0 0 0  PHQ-9 Score 6 10 4 7 12   Difficult doing work/chores Not difficult at all Somewhat difficult  Not difficult at all Somewhat difficult    Past Medical History:  Past Medical History:  Diagnosis Date   Allergic rhinitis    Asthma    childhood   Benign positional vertigo    Bunion    Contraceptive management    Depression  Genital herpes    Genital herpes simplex virus (HSV) infection in mother affecting pregnancy 07/29/2018   Henoch-Schonlein purpura (HCC)    Low-lying placenta 11/19/2018   Resolved 23 week anatomy follow up   Lymphadenopathy    Neck pain    Obesity    Ovarian cyst, right    Poor concentration    Premenstrual dysphoric syndrome    Premenstrual dysphoric syndrome    Screening for venereal disease    Skin lesion     Surgical History:  Past Surgical History:  Procedure Laterality Date   BUNIONECTOMY  2010 and 2011   CESAREAN SECTION     CESAREAN SECTION WITH BILATERAL TUBAL LIGATION  04/08/2019   Procedure: CESAREAN SECTION WITH BILATERAL TUBAL LIGATION;  Surgeon: Vena Austria, MD;  Location: [facility] ORS;  Service: Obstetrics;;   mirena IUD  01/2014   OVARIAN CYST REMOVAL  August 2014   TUBAL LIGATION  04/2019   WISDOM TOOTH  EXTRACTION  2014    Medications:  Current Outpatient Medications on File Prior to Visit  Medication Sig   acetaminophen (TYLENOL) 325 MG tablet Take 650 mg by mouth every 6 (six) hours as needed for moderate pain.   Albuterol-Budesonide (AIRSUPRA) 90-80 MCG/ACT AERO Inhale 2 puffs into the lungs every 4 (four) hours as needed.   cyanocobalamin (VITAMIN B12) 1000 MCG/ML injection Inject 1 mL (1,000 mcg total) into the skin once a week for 28 days, THEN 1 mL (1,000 mcg total) every 30 (thirty) days.   Insulin Pen Needle (PEN NEEDLES 31GX5/16") 31G X 8 MM MISC use daily.   ondansetron (ZOFRAN) 4 MG tablet Take 1 tablet (4 mg total) by mouth every 8 (eight) hours as needed for nausea or vomiting.   Vitamin D, Ergocalciferol, (DRISDOL) 1.25 MG (50000 UNIT) CAPS capsule Take 1 capsule (50,000 Units total) by mouth every 7 (seven) days.   No current facility-administered medications on file prior to visit.    Allergies:  Allergies  Allergen Reactions   Nickel Rash    Social History:  Social History   Socioeconomic History   Marital status: Married    Spouse name: Risk analyst   Number of children: 3   Years of education: Not on file   Highest education level: Not on file  Occupational History   Not on file  Tobacco Use   Smoking status: Never   Smokeless tobacco: Never  Vaping Use   Vaping status: Never Used  Substance and Sexual Activity   Alcohol use: No   Drug use: No   Sexual activity: Yes    Birth control/protection: Surgical    Comment: Tubal Ligation  Other Topics Concern   Not on file  Social History Narrative   Not on file   Social Drivers of Health   Financial Resource Strain: Not on file  Food Insecurity: Not on file  Transportation Needs: Not on file  Physical Activity: Not on file  Stress: Not on file  Social Connections: Not on file  Intimate Partner Violence: Not on file   Social History   Tobacco Use  Smoking Status Never  Smokeless Tobacco Never    Social History   Substance and Sexual Activity  Alcohol Use No    Family History:  Family History  Problem Relation Age of Onset   Hypertension Mother    Mental illness Mother        Depression, Anxiety   Hypertension Father    Hypertension Brother    Hypertension Maternal Grandmother  Diabetes Maternal Grandmother    Hyperlipidemia Maternal Grandmother    Heart disease Maternal Grandmother    COPD Maternal Grandmother    Kidney disease Maternal Grandmother    Mental illness Maternal Grandmother    Hypertension Sister    Mental illness Sister    Heart disease Maternal Grandfather    Heart attack Maternal Grandfather    Cancer Paternal Grandmother     Past medical history, surgical history, medications, allergies, family history and social history reviewed with patient today and changes made to appropriate areas of the chart.   Review of Systems  Constitutional: Negative.   HENT: Negative.    Eyes: Negative.   Respiratory: Negative.    Cardiovascular: Negative.   Gastrointestinal: Negative.   Genitourinary: Negative.   Musculoskeletal: Negative.   Skin: Negative.        Mole on L shoulder  Neurological: Negative.   Endo/Heme/Allergies: Negative.   Psychiatric/Behavioral:  Positive for depression. Negative for hallucinations, memory loss, substance abuse and suicidal ideas. The patient is nervous/anxious. The patient does not have insomnia.    All other ROS negative except what is listed above and in the HPI.      Objective:    BP 109/70 (BP Location: Left Arm, Patient Position: Sitting, Cuff Size: Large)   Pulse 81   Temp 98.4 F (36.9 C) (Oral)   Resp 16   Ht 5' 2.76" (1.594 m)   Wt 220 lb (99.8 kg)   LMP 03/03/2023 (Exact Date)   BMI 39.27 kg/m   Wt Readings from Last 3 Encounters:  03/21/23 220 lb (99.8 kg)  02/28/23 200 lb (90.7 kg)  01/14/23 206 lb (93.4 kg)    Physical Exam Vitals and nursing note reviewed.  Constitutional:      General:  She is not in acute distress.    Appearance: Normal appearance. She is obese. She is not ill-appearing, toxic-appearing or diaphoretic.  HENT:     Head: Normocephalic and atraumatic.     Right Ear: Tympanic membrane, ear canal and external ear normal. There is no impacted cerumen.     Left Ear: Tympanic membrane, ear canal and external ear normal. There is no impacted cerumen.     Nose: Nose normal. No congestion or rhinorrhea.     Mouth/Throat:     Mouth: Mucous membranes are moist.     Pharynx: Oropharynx is clear. No oropharyngeal exudate or posterior oropharyngeal erythema.  Eyes:     General: No scleral icterus.       Right eye: No discharge.        Left eye: No discharge.     Extraocular Movements: Extraocular movements intact.     Conjunctiva/sclera: Conjunctivae normal.     Pupils: Pupils are equal, round, and reactive to light.  Neck:     Vascular: No carotid bruit.  Cardiovascular:     Rate and Rhythm: Normal rate and regular rhythm.     Pulses: Normal pulses.     Heart sounds: No murmur heard.    No friction rub. No gallop.  Pulmonary:     Effort: Pulmonary effort is normal. No respiratory distress.     Breath sounds: Normal breath sounds. No stridor. No wheezing, rhonchi or rales.  Chest:     Chest wall: No tenderness.  Abdominal:     General: Abdomen is flat. Bowel sounds are normal. There is no distension.     Palpations: Abdomen is soft. There is no mass.     Tenderness: There  is no abdominal tenderness. There is no right CVA tenderness, left CVA tenderness, guarding or rebound.     Hernia: No hernia is present.  Genitourinary:    Comments: Breast and pelvic exams deferred with shared decision making Musculoskeletal:        General: No swelling, tenderness, deformity or signs of injury.     Cervical back: Normal range of motion and neck supple. No rigidity. No muscular tenderness.     Right lower leg: No edema.     Left lower leg: No edema.  Lymphadenopathy:      Cervical: No cervical adenopathy.  Skin:    General: Skin is warm and dry.     Capillary Refill: Capillary refill takes less than 2 seconds.     Coloration: Skin is not jaundiced or pale.     Findings: No bruising, erythema, lesion or rash.  Neurological:     General: No focal deficit present.     Mental Status: She is alert and oriented to person, place, and time. Mental status is at baseline.     Cranial Nerves: No cranial nerve deficit.     Sensory: No sensory deficit.     Motor: No weakness.     Coordination: Coordination normal.     Gait: Gait normal.     Deep Tendon Reflexes: Reflexes normal.  Psychiatric:        Mood and Affect: Mood normal.        Behavior: Behavior normal.        Thought Content: Thought content normal.        Judgment: Judgment normal.     Results for orders placed or performed in visit on 03/21/23  Testosterone   Collection Time: 03/21/23  4:29 PM  Result Value Ref Range   Testosterone 26 8 - 60 ng/dL  LH   Collection Time: 03/21/23  4:29 PM  Result Value Ref Range   LH 49.7 mIU/mL  [facility]   Collection Time: 03/21/23  4:29 PM  Result Value Ref Range   FSH 10.6 mIU/mL  Estradiol   Collection Time: 03/21/23  4:29 PM  Result Value Ref Range   Estradiol 142.0 pg/mL  DHEA-sulfate   Collection Time: 03/21/23  4:29 PM  Result Value Ref Range   DHEA-SO4 111.0 84.8 - 378.0 ug/dL  Prolactin   Collection Time: 03/21/23  4:29 PM  Result Value Ref Range   Prolactin 18.3 4.8 - 33.4 ng/mL      Assessment & Plan:   Problem List Items Addressed This Visit       Other   Depression, recurrent (HCC)   Will increase her fluoxetine and recheck in 1 month. Call with any concerns. Continue to monitor.       Relevant Medications   FLUoxetine (PROZAC) 20 MG tablet   Morbid obesity (HCC)   Relevant Medications   metFORMIN (GLUCOPHAGE-XR) 500 MG 24 hr tablet   Other Visit Diagnoses       Routine general medical examination at a health care facility     -  Primary   Vaccines up to date. Screening labs checked today. Pap up to date. Continue diet and exercise. Call with any concerns.     Hirsutism       Will check labs. Await results. Treat as needed.   Relevant Orders   Testosterone (Completed)   LH (Completed)   FSH (Completed)   Estradiol (Completed)   DHEA-sulfate (Completed)   Prolactin (Completed)  Follow up plan: Return in about 6 weeks (around 05/02/2023) for virtual OK.   LABORATORY TESTING:  - Pap smear: up to date  IMMUNIZATIONS:   - Tdap: Tetanus vaccination status reviewed: last tetanus booster within 10 years. - Influenza: Up to date - Pneumovax: Not applicable - Prevnar: Refused - COVID: Refused - HPV: Up to date - Shingrix vaccine: Not applicable  PATIENT COUNSELING:   Advised to take 1 mg of folate supplement per day if capable of pregnancy.   Sexuality: Discussed sexually transmitted diseases, partner selection, use of condoms, avoidance of unintended pregnancy  and contraceptive alternatives.   Advised to avoid cigarette smoking.  I discussed with the patient that most people either abstain from alcohol or drink within safe limits (<=14/week and <=4 drinks/occasion for males, <=7/weeks and <= 3 drinks/occasion for females) and that the risk for alcohol disorders and other health effects rises proportionally with the number of drinks per week and how often a drinker exceeds daily limits.  Discussed cessation/primary prevention of drug use and availability of treatment for abuse.   Diet: Encouraged to adjust caloric intake to maintain  or achieve ideal body weight, to reduce intake of dietary saturated fat and total fat, to limit sodium intake by avoiding high sodium foods and not adding table salt, and to maintain adequate dietary potassium and calcium preferably from fresh fruits, vegetables, and low-fat dairy products.    stressed the importance of regular exercise  Injury prevention: Discussed  safety belts, safety helmets, smoke detector, smoking near bedding or upholstery.   Dental health: Discussed importance of regular tooth brushing, flossing, and dental visits.    NEXT PREVENTATIVE PHYSICAL DUE IN 1 YEAR. Return in about 6 weeks (around 05/02/2023) for virtual OK.

## 2023-03-22 LAB — FOLLICLE STIMULATING HORMONE: FSH: 10.6 m[IU]/mL

## 2023-03-22 LAB — ESTRADIOL: Estradiol: 142 pg/mL

## 2023-03-22 LAB — TESTOSTERONE: Testosterone: 26 ng/dL (ref 8–60)

## 2023-03-22 LAB — PROLACTIN: Prolactin: 18.3 ng/mL (ref 4.8–33.4)

## 2023-03-22 LAB — DHEA-SULFATE: DHEA-SO4: 111 ug/dL (ref 84.8–378.0)

## 2023-03-22 LAB — LUTEINIZING HORMONE: LH: 49.7 m[IU]/mL

## 2023-03-23 ENCOUNTER — Other Ambulatory Visit: Payer: Self-pay

## 2023-03-23 NOTE — Assessment & Plan Note (Signed)
 Will increase her fluoxetine and recheck in 1 month. Call with any concerns. Continue to monitor.

## 2023-03-28 ENCOUNTER — Encounter: Payer: Self-pay | Admitting: Family Medicine

## 2023-05-02 ENCOUNTER — Ambulatory Visit: Admitting: Family Medicine

## 2023-05-05 ENCOUNTER — Telehealth: Admitting: Family Medicine

## 2023-05-05 ENCOUNTER — Encounter: Payer: Self-pay | Admitting: Family Medicine

## 2023-05-05 ENCOUNTER — Other Ambulatory Visit: Payer: Self-pay

## 2023-05-05 VITALS — Wt 226.0 lb

## 2023-05-05 DIAGNOSIS — F339 Major depressive disorder, recurrent, unspecified: Secondary | ICD-10-CM | POA: Diagnosis not present

## 2023-05-05 DIAGNOSIS — Z6841 Body Mass Index (BMI) 40.0 and over, adult: Secondary | ICD-10-CM

## 2023-05-05 MED ORDER — METFORMIN HCL ER 500 MG PO TB24
1000.0000 mg | ORAL_TABLET | Freq: Two times a day (BID) | ORAL | 2 refills | Status: DC
  Filled 2023-05-05: qty 120, 30d supply, fill #0

## 2023-05-05 NOTE — Progress Notes (Signed)
 Patient has been scheduled

## 2023-05-05 NOTE — Progress Notes (Signed)
 Wt 226 lb (102.5 kg)   BMI 40.35 kg/m    Subjective:    Patient ID: Breanna Lamb, female    DOB: [birth year], 33 y.o.   MRN: [id number]  HPI: Breanna Lamb is a 33 y.o. female  Chief Complaint  Patient presents with   Weight Check   Depression   DEPRESSION- has done a bit better on the higher dose of the prozac  but still irritable. Mood status: better Satisfied with current treatment?: no Symptom severity: mild  Duration of current treatment : months Side effects: no Medication compliance: good compliance Psychotherapy/counseling: yes in the past Previous psychiatric medications: prozac  Depressed mood: yes Anxious mood: yes Anhedonia: no Significant weight loss or gain: yes Insomnia: no  Fatigue: yes Feelings of worthlessness or guilt: yes Impaired concentration/indecisiveness: no Suicidal ideations: no Hopelessness: no Crying spells: no    05/05/2023    3:37 PM 09/19/2022    4:16 PM 03/11/2022    4:34 PM 08/01/2021   10:56 AM 02/19/2021    4:20 PM  Depression screen PHQ 2/9  Decreased Interest 1 1 1 1 1   Down, Depressed, Hopeless 0 1 1 0 1  PHQ - 2 Score 1 2 2 1 2   Altered sleeping 1 1 3 1 2   Tired, decreased energy 1 1 3 1 2   Change in appetite 3 1 1  0 0  Feeling bad or failure about yourself  0 0 0 0 0  Trouble concentrating 1 1 1 1 1   Moving slowly or fidgety/restless 0 0 0 0 0  Suicidal thoughts 0 0 0 0 0  PHQ-9 Score 7 6 10 4 7   Difficult doing work/chores Somewhat difficult Not difficult at all Somewhat difficult  Not difficult at all   OBESITY Duration: several months Previous attempts at weight loss: yes Complications of obesity: Depression Peak weight: 246lbs Weight loss goal: 180 Weight loss to date: 20lbs Requesting obesity pharmacotherapy: yes Current weight loss supplements/medications: yes- contrave  (doesn't seem to be working) Previous weight loss supplements/meds: yes wegovy  (not covered by insurance)  Relevant past medical, surgical,  family and social history reviewed and updated as indicated. Interim medical history since our last visit reviewed. Allergies and medications reviewed and updated.  Review of Systems  Constitutional: Negative.   Respiratory: Negative.    Cardiovascular: Negative.   Gastrointestinal: Negative.   Musculoskeletal: Negative.   Psychiatric/Behavioral:  Positive for dysphoric mood. Negative for agitation, behavioral problems, confusion, decreased concentration, hallucinations, self-injury, sleep disturbance and suicidal ideas. The patient is nervous/anxious. The patient is not hyperactive.     Per HPI unless specifically indicated above     Objective:    Wt 226 lb (102.5 kg)   BMI 40.35 kg/m   Wt Readings from Last 3 Encounters:  05/05/23 226 lb (102.5 kg)  03/21/23 220 lb (99.8 kg)  02/28/23 200 lb (90.7 kg)    Physical Exam Vitals and nursing note reviewed.  Constitutional:      General: She is not in acute distress.    Appearance: Normal appearance. She is obese. She is not ill-appearing, toxic-appearing or diaphoretic.  HENT:     Head: Normocephalic and atraumatic.     Right Ear: External ear normal.     Left Ear: External ear normal.     Nose: Nose normal.     Mouth/Throat:     Mouth: Mucous membranes are moist.     Pharynx: Oropharynx is clear.  Eyes:     General: No scleral icterus.  Right eye: No discharge.        Left eye: No discharge.     Conjunctiva/sclera: Conjunctivae normal.     Pupils: Pupils are equal, round, and reactive to light.  Pulmonary:     Effort: Pulmonary effort is normal. No respiratory distress.     Comments: Speaking in full sentences Musculoskeletal:        General: Normal range of motion.     Cervical back: Normal range of motion.  Skin:    Coloration: Skin is not jaundiced or pale.     Findings: No bruising, erythema, lesion or rash.  Neurological:     Mental Status: She is alert and oriented to person, place, and time. Mental  status is at baseline.  Psychiatric:        Mood and Affect: Mood normal.        Behavior: Behavior normal.        Thought Content: Thought content normal.        Judgment: Judgment normal.     Results for orders placed or performed in visit on 03/21/23  Testosterone    Collection Time: 03/21/23  4:29 PM  Result Value Ref Range   Testosterone  26 8 - 60 ng/dL  LH   Collection Time: 03/21/23  4:29 PM  Result Value Ref Range   LH 49.7 mIU/mL  [facility]   Collection Time: 03/21/23  4:29 PM  Result Value Ref Range   FSH 10.6 mIU/mL  Estradiol    Collection Time: 03/21/23  4:29 PM  Result Value Ref Range   Estradiol  142.0 pg/mL  DHEA-sulfate   Collection Time: 03/21/23  4:29 PM  Result Value Ref Range   DHEA-SO4 111.0 84.8 - 378.0 ug/dL  Prolactin   Collection Time: 03/21/23  4:29 PM  Result Value Ref Range   Prolactin 18.3 4.8 - 33.4 ng/mL      Assessment & Plan:   Problem List Items Addressed This Visit       Other   Depression, recurrent (HCC) - Primary   Stable. Will continue current regimen. Continue to monitor. Recheck in about 6-8 weeks. Adjust medication as needed.       Morbid obesity (HCC)   Has not lost any weight. Will increase her metformin  and recheck in 6-8 weeks. Encouraged diet and exercise with goal of losing 1-2lbs per week.       Relevant Medications   metFORMIN  (GLUCOPHAGE -XR) 500 MG 24 hr tablet     Follow up plan: Return in about 2 months (around 07/05/2023).   This visit was completed via video visit through MyChart due to the restrictions of the COVID-19 pandemic. All issues as above were discussed and addressed. Physical exam was done as above through visual confirmation on video through MyChart. If it was felt that the patient should be evaluated in the office, they were directed there. The patient verbally consented to this visit. Location of the patient: home Location of the provider: work Those involved with this call:  Provider: Terre Ferri, DO CMA: Linda Repress, CMA, Front Desk/Registration: Jaynee Meyer  Time spent on call:  25 minutes with patient face to face via video conference. More than 50% of this time was spent in counseling and coordination of care. 40 minutes total spent in review of patient's record and preparation of their chart.

## 2023-05-05 NOTE — Assessment & Plan Note (Signed)
 Has not lost any weight. Will increase her metformin  and recheck in 6-8 weeks. Encouraged diet and exercise with goal of losing 1-2lbs per week.

## 2023-05-05 NOTE — Assessment & Plan Note (Signed)
 Stable. Will continue current regimen. Continue to monitor. Recheck in about 6-8 weeks. Adjust medication as needed.

## 2023-05-12 ENCOUNTER — Other Ambulatory Visit: Payer: Self-pay | Admitting: Pulmonary Disease

## 2023-05-12 DIAGNOSIS — R0683 Snoring: Secondary | ICD-10-CM

## 2023-06-03 ENCOUNTER — Ambulatory Visit: Admitting: Family Medicine

## 2023-06-03 ENCOUNTER — Other Ambulatory Visit (HOSPITAL_COMMUNITY)
Admission: RE | Admit: 2023-06-03 | Discharge: 2023-06-03 | Disposition: A | Discharge status: Home / Self Care | Source: Ambulatory Visit | Attending: Family Medicine | Admitting: Family Medicine

## 2023-06-03 ENCOUNTER — Encounter: Payer: Self-pay | Admitting: Family Medicine

## 2023-06-03 VITALS — BP 121/81 | HR 78 | Ht 63.0 in | Wt 229.4 lb

## 2023-06-03 DIAGNOSIS — D485 Neoplasm of uncertain behavior of skin: Secondary | ICD-10-CM | POA: Insufficient documentation

## 2023-06-03 DIAGNOSIS — D2321 Other benign neoplasm of skin of right ear and external auricular canal: Secondary | ICD-10-CM | POA: Diagnosis not present

## 2023-06-03 NOTE — Progress Notes (Signed)
 BP 121/81 (BP Location: Left Arm, Patient Position: Sitting, Cuff Size: Large)   Pulse 78   Ht 5\' 3"  (1.6 m)   Wt 229 lb 6.4 oz (104.1 kg)   SpO2 98%   BMI 40.64 kg/m    Subjective:    Patient ID: Breanna Lamb, female    DOB: 24-Nov-1990, 33 y.o.   MRN: [id number]  HPI: TIKESHA MORT is a 33 y.o. female  Chief Complaint  Patient presents with   Nevus    Right ear. Noticed Friday that the mole has gotten bigger in size and raised. With soreness and a burning sensation.     SKIN LESION Duration: chronic- hurting for 4 days Location: R ear Painful: no Itching: yes Onset: unsure- has been there a long time Context: bigger Associated signs and symptoms: burning and bigger History of skin cancer: no History of precancerous skin lesions: yes  Relevant past medical, surgical, family and social history reviewed and updated as indicated. Interim medical history since our last visit reviewed. Allergies and medications reviewed and updated.  Review of Systems  Constitutional: Negative.   Respiratory: Negative.    Cardiovascular: Negative.   Musculoskeletal: Negative.   Skin:  Positive for color change. Negative for pallor, rash and wound.  Psychiatric/Behavioral: Negative.      Per HPI unless specifically indicated above     Objective:     BP 121/81 (BP Location: Left Arm, Patient Position: Sitting, Cuff Size: Large)   Pulse 78   Ht 5\' 3"  (1.6 m)   Wt 229 lb 6.4 oz (104.1 kg)   SpO2 98%   BMI 40.64 kg/m   Wt Readings from Last 3 Encounters:  06/03/23 229 lb 6.4 oz (104.1 kg)  05/05/23 226 lb (102.5 kg)  03/21/23 220 lb (99.8 kg)    Physical Exam Vitals and nursing note reviewed.  Constitutional:      General: She is not in acute distress.    Appearance: Normal appearance. She is well-developed.  HENT:     Head: Normocephalic and atraumatic.     Right Ear: Hearing and external ear normal.     Left Ear: Hearing and external ear normal.     Nose: Nose  normal.     Mouth/Throat:     Mouth: Mucous membranes are moist.     Pharynx: Oropharynx is clear.  Eyes:     General: Lids are normal. No scleral icterus.       Right eye: No discharge.        Left eye: No discharge.     Conjunctiva/sclera: Conjunctivae normal.  Pulmonary:     Effort: Pulmonary effort is normal. No respiratory distress.  Musculoskeletal:        General: Normal range of motion.  Skin:    Coloration: Skin is not jaundiced or pale.     Findings: No bruising, erythema, lesion or rash.     Comments: 1mm hyperpigmented lesion on R ear  Neurological:     General: No focal deficit present.     Mental Status: She is alert and oriented to person, place, and time. Mental status is at baseline.  Psychiatric:        Mood and Affect: Mood normal.        Speech: Speech normal.        Behavior: Behavior normal.        Thought Content: Thought content normal.        Judgment: Judgment normal.  Results for orders placed or performed in visit on 03/21/23  Testosterone    Collection Time: 03/21/23  4:29 PM  Result Value Ref Range   Testosterone  26 8 - 60 ng/dL  LH   Collection Time: 03/21/23  4:29 PM  Result Value Ref Range   LH 49.7 mIU/mL  [facility]   Collection Time: 03/21/23  4:29 PM  Result Value Ref Range   FSH 10.6 mIU/mL  Estradiol    Collection Time: 03/21/23  4:29 PM  Result Value Ref Range   Estradiol  142.0 pg/mL  DHEA-sulfate   Collection Time: 03/21/23  4:29 PM  Result Value Ref Range   DHEA-SO4 111.0 84.8 - 378.0 ug/dL  Prolactin   Collection Time: 03/21/23  4:29 PM  Result Value Ref Range   Prolactin 18.3 4.8 - 33.4 ng/mL      Assessment & Plan:   Problem List Items Addressed This Visit   None Visit Diagnoses       Neoplasm of uncertain behavior of skin    -  Primary   Will send off for biopsy given patient concern. Call with any concerns.   Relevant Orders   Surgical pathology       Skin Procedure  Procedure: Informed consent given.   Sterile prep of the area.  Area infiltrated with lidocaine  without epinephrine .  Using a surgical blade, part of the upper dermis shaved off and sent  for pathology.  Area cauterized. Pt ed on scarring.  Diagnosis:   ICD-10-CM   1. Neoplasm of uncertain behavior of skin  D48.5 Surgical pathology   Will send off for biopsy given patient concern. Call with any concerns.      Lesion Location/Size: 1mm hyperpigmented lesion on R ear Physician: MJ Consent:  Risks, benefits, and alternative treatments discussed and all questions were answered.  Patient elected to proceed and verbal consent obtained.  Description: Area prepped and draped using semi-sterile technique. Area locally anesthetized using 0.2 cc's of lidocaine  1% plain.  Shave biopsy of lesion performed using a dermablade.  Adequate hemostastis achieved using Silver Nitrate. Wound dressed after application of bacitracin ointment.  Post Procedure Instructions: Wound care instructions discussed and patient was instructed to keep area clean and dry.  Signs and symptoms of infection discussed, patient agrees to contact the office ASAP should they occur.  Dressing change recommended as needed. Signs and symptoms of infection discussed and patient encouraged to contact the clinic ASAP for concerns.   Follow up plan: Return if symptoms worsen or fail to improve.

## 2023-06-10 ENCOUNTER — Encounter: Payer: Self-pay | Admitting: Family Medicine

## 2023-06-12 ENCOUNTER — Encounter: Payer: Self-pay | Admitting: Emergency Medicine

## 2023-06-12 ENCOUNTER — Other Ambulatory Visit: Payer: Self-pay

## 2023-06-12 ENCOUNTER — Emergency Department

## 2023-06-12 ENCOUNTER — Ambulatory Visit: Payer: Self-pay | Admitting: Family Medicine

## 2023-06-12 ENCOUNTER — Ambulatory Visit: Payer: Self-pay | Admitting: *Deleted

## 2023-06-12 ENCOUNTER — Ambulatory Visit
Admission: RE | Admit: 2023-06-12 | Discharge: 2023-06-12 | Disposition: A | Discharge status: Home / Self Care | Source: Ambulatory Visit | Attending: Emergency Medicine | Admitting: Emergency Medicine

## 2023-06-12 ENCOUNTER — Emergency Department: Admission: EM | Admit: 2023-06-12 | Discharge: 2023-06-12 | Disposition: A | Discharge status: Home / Self Care

## 2023-06-12 VITALS — BP 114/77 | HR 77 | Temp 98.2°F | Resp 16

## 2023-06-12 DIAGNOSIS — R300 Dysuria: Secondary | ICD-10-CM | POA: Diagnosis not present

## 2023-06-12 DIAGNOSIS — R1031 Right lower quadrant pain: Secondary | ICD-10-CM | POA: Insufficient documentation

## 2023-06-12 DIAGNOSIS — R109 Unspecified abdominal pain: Secondary | ICD-10-CM | POA: Diagnosis not present

## 2023-06-12 DIAGNOSIS — R1030 Lower abdominal pain, unspecified: Secondary | ICD-10-CM

## 2023-06-12 DIAGNOSIS — M545 Low back pain, unspecified: Secondary | ICD-10-CM | POA: Insufficient documentation

## 2023-06-12 DIAGNOSIS — R197 Diarrhea, unspecified: Secondary | ICD-10-CM | POA: Diagnosis not present

## 2023-06-12 DIAGNOSIS — M549 Dorsalgia, unspecified: Secondary | ICD-10-CM | POA: Diagnosis not present

## 2023-06-12 LAB — URINALYSIS, ROUTINE W REFLEX MICROSCOPIC
Bilirubin Urine: NEGATIVE
Glucose, UA: NEGATIVE mg/dL
Hgb urine dipstick: NEGATIVE
Ketones, ur: NEGATIVE mg/dL
Leukocytes,Ua: NEGATIVE
Nitrite: NEGATIVE
Protein, ur: 100 mg/dL — AB
Specific Gravity, Urine: 1.033 — ABNORMAL HIGH (ref 1.005–1.030)
pH: 7 (ref 5.0–8.0)

## 2023-06-12 LAB — CBC
HCT: 39.3 % (ref 36.0–46.0)
Hemoglobin: 13.1 g/dL (ref 12.0–15.0)
MCH: 28.4 pg (ref 26.0–34.0)
MCHC: 33.3 g/dL (ref 30.0–36.0)
MCV: 85.1 fL (ref 80.0–100.0)
Platelets: 328 10*3/uL (ref 150–400)
RBC: 4.62 MIL/uL (ref 3.87–5.11)
RDW: 12 % (ref 11.5–15.5)
WBC: 6.8 10*3/uL (ref 4.0–10.5)
nRBC: 0 % (ref 0.0–0.2)

## 2023-06-12 LAB — POCT URINALYSIS DIP (MANUAL ENTRY)
Bilirubin, UA: NEGATIVE
Blood, UA: NEGATIVE
Glucose, UA: NEGATIVE mg/dL
Ketones, POC UA: NEGATIVE mg/dL
Leukocytes, UA: NEGATIVE
Nitrite, UA: NEGATIVE
Protein Ur, POC: 100 mg/dL — AB
Spec Grav, UA: 1.025 (ref 1.010–1.025)
Urobilinogen, UA: 1 U/dL
pH, UA: 7 (ref 5.0–8.0)

## 2023-06-12 LAB — BASIC METABOLIC PANEL WITH GFR
Anion gap: 6 (ref 5–15)
BUN: 15 mg/dL (ref 6–20)
CO2: 26 mmol/L (ref 22–32)
Calcium: 8.6 mg/dL — ABNORMAL LOW (ref 8.9–10.3)
Chloride: 105 mmol/L (ref 98–111)
Creatinine, Ser: 0.78 mg/dL (ref 0.44–1.00)
GFR, Estimated: >60 mL/min (ref >60)
Glucose, Bld: 93 mg/dL (ref 70–99)
Potassium: 4.1 mmol/L (ref 3.5–5.1)
Sodium: 137 mmol/L (ref 135–145)

## 2023-06-12 LAB — PREGNANCY, URINE: Preg Test, Ur: NEGATIVE

## 2023-06-12 LAB — SURGICAL PATHOLOGY

## 2023-06-12 MED ORDER — IOHEXOL 300 MG/ML  SOLN
100.0000 mL | Freq: Once | INTRAMUSCULAR | Status: AC | PRN
  Administered 2023-06-12: 100 mL via INTRAVENOUS

## 2023-06-12 MED ORDER — BACLOFEN 10 MG PO TABS
10.0000 mg | ORAL_TABLET | Freq: Three times a day (TID) | ORAL | 0 refills | Status: AC
Start: 2023-06-12 — End: 2023-06-19

## 2023-06-12 NOTE — Telephone Encounter (Signed)
      FYI Only or Action Required?: Action required by provider  Patient was last seen in primary care on 06/03/2023 by Terre Ferri P, DO. Called Nurse Triage reporting Back Pain. Symptoms began several days ago. Interventions attempted: OTC medications: tylenol  not effective . Symptoms are: gradually worsening.  Triage Disposition: See HCP Within 4 Hours (Or PCP Triage)  Patient/caregiver understands and will follow disposition?:  yes , no available appt patient will go to UC.                   Copied from CRM [id number]. Topic: Clinical - Red Word Triage >> Jun 12, 2023 10:06 AM Hobson Luna F wrote: Red Word that prompted transfer to Nurse Triage: Patient is calling in because she has severe back pain. Patient says it hurts to move and she experiences nausea due to the pain Reason for Disposition  [1] SEVERE back pain (e.g., excruciating, unable to do any normal activities) AND [2] not improved 2 hours after pain medicine  Answer Assessment - Initial Assessment Questions 1. ONSET: "When did the pain begin?"      Low back and abdominal  2. LOCATION: "Where does it hurt?" (upper, mid or lower back)     Low back  3. SEVERITY: "How bad is the pain?"  (e.g., Scale 1-10; mild, moderate, or severe)   - MILD (1-3): Doesn't interfere with normal activities.    - MODERATE (4-7): Interferes with normal activities or awakens from sleep.    - SEVERE (8-10): Excruciating pain, unable to do any normal activities.      Pain level 5-6/10 4. PATTERN: "Is the pain constant?" (e.g., yes, no; constant, intermittent)      Worsening pain at times with urination 5. RADIATION: "Does the pain shoot into your legs or somewhere else?"     Low stomach pain  6. CAUSE:  "What do you think is causing the back pain?"      Not sure  7. BACK OVERUSE:  "Any recent lifting of heavy objects, strenuous work or exercise?"     na 8. MEDICINES: "What have you taken so far for the pain?" (e.g., nothing,  acetaminophen , NSAIDS)     Tylenol   9. NEUROLOGIC SYMPTOMS: "Do you have any weakness, numbness, or problems with bowel/bladder control?"     na 10. OTHER SYMPTOMS: "Do you have any other symptoms?" (e.g., fever, abdomen pain, burning with urination, blood in urine)       Low abdominal pain and back pain dark urine  11. PREGNANCY: "Is there any chance you are pregnant?" "When was your last menstrual period?"       Na  Back pain severe at times and experiences nausea.  No available appt until 06/16/23. Recommended UC.  Protocols used: Back Pain-A-AH

## 2023-06-12 NOTE — ED Provider Notes (Signed)
 [name]    CSN: [id number] Arrival date & time: 06/12/23  1103      History   Chief Complaint Chief Complaint  Patient presents with   Back Pain    Entered by patient   Nausea    HPI Breanna Lamb is a 33 y.o. female.  Patient presents with 3-day history of right lower back pain, lower abdominal pain, dysuria, nausea.  The right lower back pain radiates to her right hip and right lower abdomen.  No trauma.  No numbness, weakness, saddle anesthesia, loss of bowel/bladder control, fever, hematuria, nausea, vomiting, diarrhea, constipation, vaginal discharge, pelvic pain.  Treatment attempted with ibuprofen  and a muscle relaxer without relief.  LMP 06/04/2023.  The history is provided by the patient and medical records.    Past Medical History:  Diagnosis Date   Allergic rhinitis    Asthma    childhood   Benign positional vertigo    Bunion    Contraceptive management    Depression    Genital herpes    Genital herpes simplex virus (HSV) infection in mother affecting pregnancy 07/29/2018   Henoch-Schonlein purpura (HCC)    Low-lying placenta 11/19/2018   Resolved 23 week anatomy follow up   Lymphadenopathy    Neck pain    Obesity    Ovarian cyst, right    Poor concentration    Premenstrual dysphoric syndrome    Premenstrual dysphoric syndrome    Screening for venereal disease    Skin lesion     Patient Active Problem List   Diagnosis Date Noted   Morbid obesity (HCC) 03/23/2023   Asthmatic bronchitis with acute exacerbation 02/28/2023   Vitamin B12 deficiency 09/19/2022   Vitamin D  deficiency 09/19/2022   Depression, recurrent (HCC) 01/03/2020   History of morbid obesity 01/03/2020   Chronic tension-type headache, not intractable 01/31/2016    Past Surgical History:  Procedure Laterality Date   BUNIONECTOMY  2010 and 2011   CESAREAN SECTION     CESAREAN SECTION WITH BILATERAL TUBAL LIGATION  04/08/2019   Procedure: CESAREAN SECTION WITH BILATERAL  TUBAL LIGATION;  Surgeon: Darl Edu, MD;  Location: [facility] ORS;  Service: Obstetrics;;   mirena IUD  01/2014   OVARIAN CYST REMOVAL  August 2014   TUBAL LIGATION  04/2019   WISDOM TOOTH EXTRACTION  2014    OB History     Gravida  3   Para  3   Term  3   Preterm      AB      Living  3      SAB      IAB      Ectopic      Multiple  0   Live Births  3            Home Medications    Prior to Admission medications   Medication Sig Start Date End Date Taking? Authorizing Provider  cyanocobalamin  (VITAMIN B12) 1000 MCG/ML injection Inject 1 mL (1,000 mcg total) into the skin once a week for 28 days, THEN 1 mL (1,000 mcg total) every 30 (thirty) days. 09/19/22 09/18/23 Yes Johnson, Megan P, DO  FLUoxetine  (PROZAC ) 20 MG tablet Take 3 tablets (60 mg total) by mouth daily. 03/21/23  Yes Johnson, Megan P, DO  metFORMIN  (GLUCOPHAGE -XR) 500 MG 24 hr tablet Take 2 tablets (1000mg ) in the morning and 1 tablet (500mg ) in the evening for 1 week then increase to 2 tablets (1000mg ) 2 (two) times daily. 05/05/23  Yes Johnson, Megan P, DO  Vitamin D , Ergocalciferol , (DRISDOL ) 1.25 MG (50000 UNIT) CAPS capsule Take 1 capsule (50,000 Units total) by mouth every 7 (seven) days. 01/19/23  Yes Johnson, Megan P, DO  acetaminophen  (TYLENOL ) 325 MG tablet Take 650 mg by mouth every 6 (six) hours as needed for moderate pain.    [provider]  Albuterol -Budesonide  (AIRSUPRA ) 90-80 MCG/ACT AERO Inhale 2 puffs into the lungs every 4 (four) hours as needed. 12/21/21   Marc Senior, MD  ondansetron  (ZOFRAN ) 4 MG tablet Take 1 tablet (4 mg total) by mouth every 8 (eight) hours as needed for nausea or vomiting. 03/11/22   Solomon Dupre, DO    Family History Family History  Problem Relation Age of Onset   Hypertension Mother    Mental illness Mother        Depression, Anxiety   Hypertension Father    Hypertension Brother    Hypertension Maternal Grandmother    Diabetes  Maternal Grandmother    Hyperlipidemia Maternal Grandmother    Heart disease Maternal Grandmother    COPD Maternal Grandmother    Kidney disease Maternal Grandmother    Mental illness Maternal Grandmother    Hypertension Sister    Mental illness Sister    Heart disease Maternal Grandfather    Heart attack Maternal Grandfather    Cancer Paternal Grandmother     Social History Social History   Tobacco Use   Smoking status: Never   Smokeless tobacco: Never  Vaping Use   Vaping status: Never Used  Substance Use Topics   Alcohol use: No   Drug use: No     Allergies   Nickel   Review of Systems Review of Systems  Constitutional:  Negative for chills and fever.  Gastrointestinal:  Positive for abdominal pain. Negative for blood in stool, diarrhea and vomiting.  Genitourinary:  Positive for dysuria and flank pain. Negative for frequency, hematuria, pelvic pain and vaginal discharge.  Musculoskeletal:  Positive for back pain. Negative for gait problem.  Neurological:  Negative for weakness and numbness.     Physical Exam Triage Vital Signs ED Triage Vitals  Encounter Vitals Group     BP 06/12/23 1130 114/77     Systolic BP Percentile --      Diastolic BP Percentile --      Pulse Rate 06/12/23 1130 77     Resp 06/12/23 1130 16     Temp 06/12/23 1130 98.2 F (36.8 C)     Temp Source 06/12/23 1130 Oral     SpO2 06/12/23 1130 98 %     Weight --      Height --      Head Circumference --      Peak Flow --      Pain Score 06/12/23 1120 6     Pain Loc --      Pain Education --      Exclude from Growth Chart --    No data found.  Updated Vital Signs BP 114/77 (BP Location: Left Arm)   Pulse 77   Temp 98.2 F (36.8 C) (Oral)   Resp 16   LMP 06/04/2023 (Exact Date)   SpO2 98%   Visual Acuity Right Eye Distance:   Left Eye Distance:   Bilateral Distance:    Right Eye Near:   Left Eye Near:    Bilateral Near:     Physical Exam Constitutional:       General: She is not in  acute distress. HENT:     Mouth/Throat:     Mouth: Mucous membranes are moist.  Cardiovascular:     Rate and Rhythm: Normal rate and regular rhythm.     Heart sounds: Normal heart sounds.  Pulmonary:     Effort: Pulmonary effort is normal. No respiratory distress.     Breath sounds: Normal breath sounds.  Abdominal:     General: Bowel sounds are normal.     Palpations: Abdomen is soft.     Tenderness: There is abdominal tenderness in the right lower quadrant, suprapubic area and left lower quadrant. There is right CVA tenderness. There is no left CVA tenderness, guarding or rebound.  Musculoskeletal:        General: No swelling or deformity. Normal range of motion.  Skin:    General: Skin is warm and dry.     Capillary Refill: Capillary refill takes less than 2 seconds.     Findings: No bruising, erythema, lesion or rash.  Neurological:     General: No focal deficit present.     Mental Status: She is alert.     Sensory: No sensory deficit.     Motor: No weakness.     Gait: Gait normal.      UC Treatments / Results  Labs (all labs ordered are listed, but only abnormal results are displayed) Labs Reviewed  POCT URINALYSIS DIP (MANUAL ENTRY) - Abnormal; Notable for the following components:      Result Value   Protein Ur, POC =100 (*)    All other components within normal limits    EKG   Radiology No results found.  Procedures Procedures (including critical care time)  Medications Ordered in UC Medications - No data to display  Initial Impression / Assessment and Plan / UC Course  I have reviewed the triage vital signs and the nursing notes.  Pertinent labs & imaging results that were available during my care of the patient were reviewed by me and considered in my medical decision making (see chart for details).    Right flank pain, lower abdominal pain, dysuria.  Afebrile and vital signs are stable.  Discussed limitations of evaluation of  her symptoms in an urgent care setting.  She has lower abdominal tenderness and right CVA tenderness.  Her urine does not show signs of infection.  She denies vaginal symptoms.  No trauma.  Sending her to the ED for evaluation.  She is agreeable to this and will drive herself to [facility] ED.  Final Clinical Impressions(s) / UC Diagnoses   Final diagnoses:  Right flank pain  Lower abdominal pain  Dysuria     Discharge Instructions      Go to the emergency department for evaluation of your flank pain, abdominal pain, dysuria.   ED Prescriptions   None    I have reviewed the PDMP during this encounter.   Wellington Half, NP 06/12/23 1150

## 2023-06-12 NOTE — ED Notes (Signed)
 See triage note  Presents with lower back pain   States pain started on Sunday  Has tried several OTC meds and flexeril  w/o improvement.  Denies any urinary sxs  No n/v/fever  Ambulates well to treatment room

## 2023-06-12 NOTE — ED Provider Notes (Signed)
 [facility] Provider Note    Event Date/Time   First MD Initiated Contact with Patient 06/12/23 1242     (approximate)   History   Back Pain and Flank Pain   HPI  Breanna Lamb is a 33 y.o. female who presents today for evaluation of right lower quadrant pain and right sided flank pain.  Patient reports this began 2 days ago.  No nausea or vomiting.  No diarrhea.  No dysuria.  No vaginal discharge, reports that she is currently on her menstrual cycle.  Denies history of kidney stones.  Patient Active Problem List   Diagnosis Date Noted   Morbid obesity (HCC) 03/23/2023   Asthmatic bronchitis with acute exacerbation 02/28/2023   Vitamin B12 deficiency 09/19/2022   Vitamin D  deficiency 09/19/2022   Depression, recurrent (HCC) 01/03/2020   History of morbid obesity 01/03/2020   Chronic tension-type headache, not intractable 01/31/2016          Physical Exam   Triage Vital Signs: ED Triage Vitals  Encounter Vitals Group     BP 06/12/23 1206 (!) 138/95     Systolic BP Percentile --      Diastolic BP Percentile --      Pulse Rate 06/12/23 1205 71     Resp 06/12/23 1205 17     Temp 06/12/23 1205 98.2 F (36.8 C)     Temp Source 06/12/23 1205 Oral     SpO2 06/12/23 1205 100 %     Weight 06/12/23 1206 229 lb 8 oz (104.1 kg)     Height 06/12/23 1206 5\' 3"  (1.6 m)     Head Circumference --      Peak Flow --      Pain Score 06/12/23 1206 7     Pain Loc --      Pain Education --      Exclude from Growth Chart --     Most recent vital signs: Vitals:   06/12/23 1205 06/12/23 1206  BP:  (!) 138/95  Pulse: 71   Resp: 17   Temp: 98.2 F (36.8 C)   SpO2: 100%     Physical Exam Vitals and nursing note reviewed.  Constitutional:      General: Awake and alert. No acute distress.    Appearance: Normal appearance.   HENT:     Head: Normocephalic and atraumatic.     Mouth: Mucous membranes are moist.  Eyes:     General: PERRL. Normal EOMs         Right eye: No discharge.        Left eye: No discharge.     Conjunctiva/sclera: Conjunctivae normal.  Cardiovascular:     Rate and Rhythm: Normal rate and regular rhythm.     Pulses: Normal pulses.  Pulmonary:     Effort: Pulmonary effort is normal. No respiratory distress.     Breath sounds: Normal breath sounds.  Abdominal:     Abdomen is soft.  No rebound or guarding. No distention.  Right CVA tenderness, right lower quadrant tenderness, epigastric tenderness Musculoskeletal:        General: No swelling. Normal range of motion.     Cervical back: Normal range of motion and neck supple.  Skin:    General: Skin is warm and dry.     Capillary Refill: Capillary refill takes less than 2 seconds.     Findings: No rash.  Neurological:     Mental Status: The patient is awake and  alert.      ED Results / Procedures / Treatments   Labs (all labs ordered are listed, but only abnormal results are displayed) Labs Reviewed  URINALYSIS, ROUTINE W REFLEX MICROSCOPIC - Abnormal; Notable for the following components:      Result Value   Color, Urine YELLOW (*)    APPearance CLOUDY (*)    Specific Gravity, Urine 1.033 (*)    Protein, ur 100 (*)    Bacteria, UA FEW (*)    All other components within normal limits  BASIC METABOLIC PANEL WITH GFR - Abnormal; Notable for the following components:   Calcium 8.6 (*)    All other components within normal limits  CBC  PREGNANCY, URINE  POC URINE PREG, ED     EKG     RADIOLOGY I independently reviewed and interpreted imaging and agree with radiologists findings.     PROCEDURES:  Critical Care performed:   Procedures   MEDICATIONS ORDERED IN ED: Medications  iohexol  (OMNIPAQUE ) 300 MG/ML solution 100 mL (100 mLs Intravenous Contrast Given 06/12/23 1501)     IMPRESSION / MDM / ASSESSMENT AND PLAN / ED COURSE  I reviewed the triage vital signs and the nursing notes.   Differential diagnosis includes, but is not  limited to, nephrolithiasis, pyelonephritis, UTI, appendicitis, ovarian cyst.  Patient is awake and alert, hemodynamically stable and afebrile.  She is nontoxic in appearance.  Further workup is indicated.  Labs were obtained and are overall reassuring.  Urinalysis demonstrates rare bacteria.  Pregnancy is negative.  Clinical picture is not consistent with ovarian torsion.  CT scan obtained for further evaluation.  Patient was passed off to oncoming provider while awaiting CT results and final disposition.  Patient was passed off to Aneta Keepers, PA-C.   Patient's presentation is most consistent with acute complicated illness / injury requiring diagnostic workup.   FINAL CLINICAL IMPRESSION(S) / ED DIAGNOSES   Final diagnoses:  Right flank pain     Rx / DC Orders   ED Discharge Orders     None        Note:  This document was prepared using Dragon voice recognition software and may include unintentional dictation errors.   Savannha Welle E, PA-C 06/12/23 1519    Collis Deaner, MD 06/15/23 [phone]

## 2023-06-12 NOTE — ED Notes (Signed)
 Patient is being discharged from the Urgent Care and sent to the Emergency Department via PV . Per provider kelly tate, patient is in need of higher level of care due to lower abdominal and flank pain needing further testing. Patient is aware and verbalizes understanding of plan of care.  Vitals:   06/12/23 1130  BP: 114/77  Pulse: 77  Resp: 16  Temp: 98.2 F (36.8 C)  SpO2: 98%

## 2023-06-12 NOTE — ED Triage Notes (Signed)
 Pt here with right side back pain. Pt states the pain started on Sun and the pain is constant. Pt endorses nausea and diarrhea, denies fever. Pt stable in triage.

## 2023-06-12 NOTE — Discharge Instructions (Signed)
 Follow-up with your regular doctor as needed.  Return worsening.  Take medication as prescribed.  May continue taking ibuprofen  and Tylenol  for pain

## 2023-06-12 NOTE — Discharge Instructions (Signed)
 Go to the emergency department for evaluation of your flank pain, abdominal pain, dysuria.

## 2023-06-12 NOTE — ED Triage Notes (Signed)
 Lower back pain, nausea, lower abdominal pain, x 3 days. No vomiting or fever. Tried ibuprofen , muscle relaxer with no relief of symptoms.

## 2023-06-12 NOTE — ED Provider Notes (Signed)
  Physical Exam  BP (!) 138/95   Pulse 71   Temp 98.2 F (36.8 C) (Oral)   Resp 17   Ht 5\' 3"  (1.6 m)   Wt 104.1 kg   LMP 06/04/2023 (Exact Date)   SpO2 100%   BMI 40.65 kg/m   Physical Exam  Procedures  Procedures  ED Course / MDM    Medical Decision Making Amount and/or Complexity of Data Reviewed Labs: ordered. Radiology: ordered.  Risk Prescription drug management.   Assuming care from Jenna Poggi, PA-C at shift change.  Plan is to await CT results for evaluation for kidney stone or acute appendicitis.  See original H&P along with physical exam   CT abdomen pelvis IV contrast independently reviewed interpreted by me by reading radiologist report and looking at images, appears to be normal with no acute abnormalities  On reexamination of the patient she tends to be more tender along the lower back and the musculature, will do a trial of a muscle relaxer.  Offered a work note but she declined.  She is to follow-up with her regular doctor if not improving in 3 to 4 days.  Return if worsening.  In agreement treatment plan.  Discharged stable condition.    Delsie Figures, PA-C 06/12/23 1540    Bradler, Evan K, MD 06/12/23 [phone]

## 2023-06-20 ENCOUNTER — Ambulatory Visit: Admitting: Family Medicine

## 2023-07-25 ENCOUNTER — Encounter: Payer: Self-pay | Admitting: Family Medicine

## 2023-07-25 ENCOUNTER — Other Ambulatory Visit: Payer: Self-pay

## 2023-07-25 ENCOUNTER — Telehealth: Admitting: Family Medicine

## 2023-07-25 VITALS — Wt 240.0 lb

## 2023-07-25 DIAGNOSIS — R0683 Snoring: Secondary | ICD-10-CM

## 2023-07-25 DIAGNOSIS — F339 Major depressive disorder, recurrent, unspecified: Secondary | ICD-10-CM

## 2023-07-25 MED ORDER — WEGOVY 1 MG/0.5ML ~~LOC~~ SOAJ
1.0000 mg | SUBCUTANEOUS | 1 refills | Status: DC
  Filled 2023-07-25: qty 2, fill #0

## 2023-07-25 MED ORDER — WEGOVY 0.25 MG/0.5ML ~~LOC~~ SOAJ
0.2500 mg | SUBCUTANEOUS | 0 refills | Status: DC
  Filled 2023-07-25: qty 2, 28d supply, fill #0

## 2023-07-25 MED ORDER — WEGOVY 0.5 MG/0.5ML ~~LOC~~ SOAJ
0.5000 mg | SUBCUTANEOUS | 0 refills | Status: DC
  Filled 2023-07-25: qty 2, fill #0

## 2023-07-25 NOTE — Assessment & Plan Note (Signed)
 Will start wegovy - aware it will be cash pay. Follow up 3 months to see how she's doing.

## 2023-07-25 NOTE — Progress Notes (Signed)
 Wt 240 lb (108.9 kg)   BMI 42.51 kg/m    Subjective:    Patient ID: Breanna Lamb, female    DOB: 20-May-1990, 33 y.o.   MRN: [id number]  HPI: Breanna Lamb is a 33 y.o. female  Chief Complaint  Patient presents with   Depression   Obesity   Back Pain    ER f/u 06/12/23   DEPRESSION- has been feeling worse primarily because of the weight. Feeling frustrated Mood status: exacerbated Satisfied with current treatment?: yes Symptom severity: moderate  Duration of current treatment : chronic Side effects: no Medication compliance: excellent compliance Psychotherapy/counseling: yes in the past Previous psychiatric medications: prozac  Depressed mood: yes Anxious mood: yes Anhedonia: no Significant weight loss or gain: yes Insomnia: no  Fatigue: yes Feelings of worthlessness or guilt: yes Impaired concentration/indecisiveness: yes Suicidal ideations: no Hopelessness: no Crying spells: no    07/25/2023    2:51 PM 05/05/2023    3:37 PM 09/19/2022    4:16 PM 03/11/2022    4:34 PM 08/01/2021   10:56 AM  Depression screen PHQ 2/9  Decreased Interest 2 1 1 1 1   Down, Depressed, Hopeless 0 0 1 1 0  PHQ - 2 Score 2 1 2 2 1   Altered sleeping 2 1 1 3 1   Tired, decreased energy 2 1 1 3 1   Change in appetite 2 3 1 1  0  Feeling bad or failure about yourself  0 0 0 0 0  Trouble concentrating 0 1 1 1 1   Moving slowly or fidgety/restless 0 0 0 0 0  Suicidal thoughts 0 0 0 0 0  PHQ-9 Score 8 7 6 10 4   Difficult doing work/chores Somewhat difficult Somewhat difficult Not difficult at all Somewhat difficult    OBESITY Duration: several months Previous attempts at weight loss: yes Complications of obesity: Depression Peak weight: 246lbs Weight loss goal: 180 Weight loss to date: 6lb- up another 20lbs Requesting obesity pharmacotherapy: yes Current weight loss supplements/medications: yes- contrave  (doesn't seem to be working) Previous weight loss supplements/meds: yes wegovy  (not  covered by insurance)  ???SLEEP APNEA Sleep apnea status: unsure Duration: months Satisfied with current treatment?:  no CPAP use:  no Last sleep study: not done Treatments attempted: none Wakes feeling refreshed:  no Daytime hypersomnolence:  yes Fatigue:  yes Insomnia:  yes Good sleep hygiene:  no Difficulty falling asleep:  no Difficulty staying asleep:  no Snoring bothers bed partner:  yes Observed apnea by bed partner: yes Obesity:  yes Hypertension: no  Pulmonary hypertension:  no Coronary artery disease:  no   Relevant past medical, surgical, family and social history reviewed and updated as indicated. Interim medical history since our last visit reviewed. Allergies and medications reviewed and updated.  Review of Systems  Constitutional: Negative.   Respiratory: Negative.    Cardiovascular: Negative.   Musculoskeletal: Negative.   Skin: Negative.   Psychiatric/Behavioral:  Positive for dysphoric mood. Negative for agitation, behavioral problems, confusion, decreased concentration, hallucinations, self-injury, sleep disturbance and suicidal ideas. The patient is nervous/anxious. The patient is not hyperactive.     Per HPI unless specifically indicated above     Objective:    Wt 240 lb (108.9 kg)   BMI 42.51 kg/m   Wt Readings from Last 3 Encounters:  07/25/23 240 lb (108.9 kg)  06/12/23 229 lb 8 oz (104.1 kg)  06/03/23 229 lb 6.4 oz (104.1 kg)    Physical Exam Vitals and nursing note reviewed.  Constitutional:  General: She is not in acute distress.    Appearance: Normal appearance. She is not ill-appearing, toxic-appearing or diaphoretic.  HENT:     Head: Normocephalic and atraumatic.     Right Ear: External ear normal.     Left Ear: External ear normal.     Nose: Nose normal.     Mouth/Throat:     Mouth: Mucous membranes are moist.     Pharynx: Oropharynx is clear.  Eyes:     General: No scleral icterus.       Right eye: No discharge.         Left eye: No discharge.     Conjunctiva/sclera: Conjunctivae normal.     Pupils: Pupils are equal, round, and reactive to light.  Pulmonary:     Effort: Pulmonary effort is normal. No respiratory distress.     Comments: Speaking in full sentences Musculoskeletal:        General: Normal range of motion.     Cervical back: Normal range of motion.  Skin:    Coloration: Skin is not jaundiced or pale.     Findings: No bruising, erythema, lesion or rash.  Neurological:     Mental Status: She is alert and oriented to person, place, and time. Mental status is at baseline.  Psychiatric:        Mood and Affect: Mood normal.        Behavior: Behavior normal.        Thought Content: Thought content normal.        Judgment: Judgment normal.     Results for orders placed or performed during the hospital encounter of 06/12/23  Urinalysis, Routine w reflex microscopic -Urine, Clean Catch   Collection Time: 06/12/23 12:07 PM  Result Value Ref Range   Color, Urine YELLOW (A) YELLOW   APPearance CLOUDY (A) CLEAR   Specific Gravity, Urine 1.033 (H) 1.005 - 1.030   pH 7.0 5.0 - 8.0   Glucose, UA NEGATIVE NEGATIVE mg/dL   Hgb urine dipstick NEGATIVE NEGATIVE   Bilirubin Urine NEGATIVE NEGATIVE   Ketones, ur NEGATIVE NEGATIVE mg/dL   Protein, ur 899 (A) NEGATIVE mg/dL   Nitrite NEGATIVE NEGATIVE   Leukocytes,Ua NEGATIVE NEGATIVE   RBC / HPF 0-5 0 - 5 RBC/hpf   WBC, UA 0-5 0 - 5 WBC/hpf   Bacteria, UA FEW (A) NONE SEEN   Squamous Epithelial / HPF 11-20 0 - 5 /HPF   Mucus PRESENT    Amorphous Crystal PRESENT   Basic metabolic panel   Collection Time: 06/12/23 12:07 PM  Result Value Ref Range   Sodium 137 135 - 145 mmol/L   Potassium 4.1 3.5 - 5.1 mmol/L   Chloride 105 98 - 111 mmol/L   CO2 26 22 - 32 mmol/L   Glucose, Bld 93 70 - 99 mg/dL   BUN 15 6 - 20 mg/dL   Creatinine, Ser 9.21 0.44 - 1.00 mg/dL   Calcium 8.6 (L) 8.9 - 10.3 mg/dL   GFR, Estimated >39 >39 mL/min   Anion gap 6 5 -  15  CBC   Collection Time: 06/12/23 12:07 PM  Result Value Ref Range   WBC 6.8 4.0 - 10.5 K/uL   RBC 4.62 3.87 - 5.11 MIL/uL   Hemoglobin 13.1 12.0 - 15.0 g/dL   HCT 60.6 63.9 - 53.9 %   MCV 85.1 80.0 - 100.0 fL   MCH 28.4 26.0 - 34.0 pg   MCHC 33.3 30.0 - 36.0 g/dL   RDW  12.0 11.5 - 15.5 %   Platelets 328 150 - 400 K/uL   nRBC 0.0 0.0 - 0.2 %  Pregnancy, urine   Collection Time: 06/12/23 12:07 PM  Result Value Ref Range   Preg Test, Ur NEGATIVE NEGATIVE      Assessment & Plan:   Problem List Items Addressed This Visit       Other   Depression, recurrent (HCC) - Primary   Acting up due to weight. Will start weight loss medicine and recheck in 3 months. Call with any concerns.       Morbid obesity (HCC)   Will start wegovy - aware it will be cash pay. Follow up 3 months to see how she's doing.       Relevant Medications   Semaglutide -Weight Management (WEGOVY ) 0.25 MG/0.5ML SOAJ   Semaglutide -Weight Management (WEGOVY ) 0.5 MG/0.5ML SOAJ (Start on 08/22/2023)   Semaglutide -Weight Management (WEGOVY ) 1 MG/0.5ML SOAJ   Other Visit Diagnoses       Snoring       Will see pulmonary to check for OSA.        Follow up plan: Return in about 3 months (around 10/25/2023).   This visit was completed via video visit through MyChart due to the restrictions of the COVID-19 pandemic. All issues as above were discussed and addressed. Physical exam was done as above through visual confirmation on video through MyChart. If it was felt that the patient should be evaluated in the office, they were directed there. The patient verbally consented to this visit. Location of the patient: parking lot Location of the provider: work Those involved with this call:  Provider: Duwaine Louder, DO CMA: York Fogo, CMA, Front Desk/Registration: Claretta Maiden  Time spent on call: 25 minutes with patient face to face via video conference. More than 50% of this time was spent in counseling and  coordination of care. 40 minutes total spent in review of patient's record and preparation of their chart.

## 2023-07-25 NOTE — Assessment & Plan Note (Signed)
 Acting up due to weight. Will start weight loss medicine and recheck in 3 months. Call with any concerns.

## 2023-07-28 ENCOUNTER — Other Ambulatory Visit: Payer: Self-pay

## 2023-07-28 NOTE — Progress Notes (Signed)
 Scheduled

## 2023-08-04 ENCOUNTER — Other Ambulatory Visit: Payer: Self-pay

## 2023-08-07 ENCOUNTER — Other Ambulatory Visit: Payer: Self-pay

## 2023-08-29 ENCOUNTER — Other Ambulatory Visit: Payer: Self-pay

## 2023-09-07 ENCOUNTER — Encounter: Payer: Self-pay | Admitting: Family Medicine

## 2023-09-07 DIAGNOSIS — Z789 Other specified health status: Secondary | ICD-10-CM

## 2023-09-07 DIAGNOSIS — E538 Deficiency of other specified B group vitamins: Secondary | ICD-10-CM

## 2023-09-07 DIAGNOSIS — E559 Vitamin D deficiency, unspecified: Secondary | ICD-10-CM

## 2023-10-03 ENCOUNTER — Encounter: Payer: Self-pay | Admitting: Family Medicine

## 2023-10-20 ENCOUNTER — Ambulatory Visit (INDEPENDENT_AMBULATORY_CARE_PROVIDER_SITE_OTHER)

## 2023-10-20 ENCOUNTER — Telehealth: Payer: Self-pay | Admitting: Primary Care

## 2023-10-20 ENCOUNTER — Other Ambulatory Visit: Payer: Self-pay

## 2023-10-20 DIAGNOSIS — J209 Acute bronchitis, unspecified: Secondary | ICD-10-CM | POA: Diagnosis not present

## 2023-10-20 MED ORDER — PREDNISONE 10 MG PO TABS
ORAL_TABLET | ORAL | 0 refills | Status: AC
Start: 2023-10-20 — End: 2023-10-28
  Filled 2023-10-20: qty 20, 8d supply, fill #0

## 2023-10-20 MED ORDER — BENZONATATE 100 MG PO CAPS
200.0000 mg | ORAL_CAPSULE | Freq: Three times a day (TID) | ORAL | 0 refills | Status: DC | PRN
  Filled 2023-10-20: qty 45, 8d supply, fill #0

## 2023-10-20 MED ORDER — AMOXICILLIN-POT CLAVULANATE 875-125 MG PO TABS
1.0000 | ORAL_TABLET | Freq: Two times a day (BID) | ORAL | 0 refills | Status: DC
  Filled 2023-10-20: qty 14, 7d supply, fill #0

## 2023-10-20 NOTE — Telephone Encounter (Signed)
 Developed cough and pleuritic chest pain Thursday October 9th  Daughter currently has pneumonia Order CXR Sending in RX Augmentin, prednisone  taper and tessalon  perles

## 2023-10-21 ENCOUNTER — Encounter: Payer: Self-pay | Admitting: *Deleted

## 2023-10-21 ENCOUNTER — Ambulatory Visit
Admission: EM | Admit: 2023-10-21 | Discharge: 2023-10-21 | Disposition: A | Discharge status: Home / Self Care | Attending: Family Medicine | Admitting: Family Medicine

## 2023-10-21 ENCOUNTER — Other Ambulatory Visit: Payer: Self-pay

## 2023-10-21 DIAGNOSIS — S29019A Strain of muscle and tendon of unspecified wall of thorax, initial encounter: Secondary | ICD-10-CM

## 2023-10-21 DIAGNOSIS — S161XXA Strain of muscle, fascia and tendon at neck level, initial encounter: Secondary | ICD-10-CM | POA: Diagnosis not present

## 2023-10-21 MED ORDER — CYCLOBENZAPRINE HCL 5 MG PO TABS
5.0000 mg | ORAL_TABLET | Freq: Three times a day (TID) | ORAL | 0 refills | Status: AC | PRN
  Filled 2023-10-21: qty 30, 10d supply, fill #0
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
## 2023-10-31 ENCOUNTER — Other Ambulatory Visit: Payer: Self-pay

## 2023-10-31 ENCOUNTER — Ambulatory Visit: Admitting: Family Medicine

## 2023-11-05 ENCOUNTER — Telehealth: Payer: Self-pay | Admitting: Family Medicine

## 2023-11-05 NOTE — Telephone Encounter (Signed)
 Routing to provider to advise. Routing as high priority since appointment is scheduled for tomorrow.

## 2023-11-05 NOTE — Telephone Encounter (Unsigned)
 Copied from CRM [id number]. Topic: Appointments - Scheduling Inquiry for Clinic >> Nov 05, 2023  8:45 AM Breanna Lamb wrote: Reason for CRM: The patient would like to know if their appointment scheduled for 11/06/23 can be changed to virtual with the approval of their PCP. Please contact further when possible

## 2023-11-06 ENCOUNTER — Telehealth: Payer: Self-pay | Admitting: Family Medicine

## 2023-11-06 ENCOUNTER — Ambulatory Visit: Admitting: Family Medicine

## 2023-11-06 NOTE — Telephone Encounter (Signed)
 This is a wegovy  follow up- need vitals. If she's able to get them, it can be a virtual. If not she should come in

## 2023-11-06 NOTE — Telephone Encounter (Signed)
 Patient will have vitals ready for her virtual appointment on 11/18

## 2023-11-06 NOTE — Telephone Encounter (Signed)
 Spoke to Carlisle to let her know that she must have her vitals done and ready for her virtual appointment.

## 2023-11-21 ENCOUNTER — Ambulatory Visit (HOSPITAL_BASED_OUTPATIENT_CLINIC_OR_DEPARTMENT_OTHER): Admitting: Student

## 2023-11-24 ENCOUNTER — Ambulatory Visit (HOSPITAL_BASED_OUTPATIENT_CLINIC_OR_DEPARTMENT_OTHER): Admitting: Student

## 2023-11-25 ENCOUNTER — Ambulatory Visit (INDEPENDENT_AMBULATORY_CARE_PROVIDER_SITE_OTHER): Admitting: Student

## 2023-11-25 ENCOUNTER — Other Ambulatory Visit: Payer: Self-pay

## 2023-11-25 ENCOUNTER — Telehealth: Admitting: Family Medicine

## 2023-11-25 ENCOUNTER — Ambulatory Visit (INDEPENDENT_AMBULATORY_CARE_PROVIDER_SITE_OTHER)

## 2023-11-25 ENCOUNTER — Other Ambulatory Visit (HOSPITAL_BASED_OUTPATIENT_CLINIC_OR_DEPARTMENT_OTHER): Payer: Self-pay | Admitting: Student

## 2023-11-25 ENCOUNTER — Other Ambulatory Visit (HOSPITAL_BASED_OUTPATIENT_CLINIC_OR_DEPARTMENT_OTHER): Payer: Self-pay

## 2023-11-25 ENCOUNTER — Encounter: Payer: Self-pay | Admitting: Family Medicine

## 2023-11-25 VITALS — BP 132/82 | HR 104 | Ht 61.5 in | Wt 251.0 lb

## 2023-11-25 DIAGNOSIS — Z6841 Body Mass Index (BMI) 40.0 and over, adult: Secondary | ICD-10-CM

## 2023-11-25 DIAGNOSIS — M25551 Pain in right hip: Secondary | ICD-10-CM

## 2023-11-25 DIAGNOSIS — F339 Major depressive disorder, recurrent, unspecified: Secondary | ICD-10-CM

## 2023-11-25 DIAGNOSIS — R0683 Snoring: Secondary | ICD-10-CM

## 2023-11-25 MED ORDER — TIRZEPATIDE-WEIGHT MANAGEMENT 2.5 MG/0.5ML ~~LOC~~ SOLN: 2.5000 mg | SUBCUTANEOUS | 0 refills | Status: AC

## 2023-11-25 MED ORDER — FLUOXETINE HCL 20 MG PO TABS
60.0000 mg | ORAL_TABLET | Freq: Every day | ORAL | 1 refills | Status: AC
  Filled 2023-11-25: qty 270, 90d supply, fill #0

## 2023-11-25 NOTE — Progress Notes (Signed)
 BP 132/82 (BP Location: Left Arm, Patient Position: Sitting, Cuff Size: Large)   Pulse (!) 104   Ht 5' 1.5 (1.562 m)   Wt 251 lb (113.9 kg)   SpO2 96%   BMI 46.66 kg/m    Subjective:    Patient ID: Breanna Lamb, female    DOB: 02/16/1990, 33 y.o.   MRN: [id number]  HPI: Breanna Lamb is a 33 y.o. female  Chief Complaint  Patient presents with   Follow-up    Mood    Medication Refill    Prozac    Was not able to get the wegovy .   ????SLEEP APNEA Sleep apnea status: unsure Duration: months Satisfied with current treatment?:  no Last sleep study: none Treatments attempted: none  Wakes feeling refreshed:  no Daytime hypersomnolence:  yes Fatigue:  yes Insomnia:  no Good sleep hygiene:  yes Difficulty falling asleep:  no Difficulty staying asleep:  no Snoring bothers bed partner:  yes Observed apnea by bed partner: yes Obesity:  yes Hypertension: no  Pulmonary hypertension:  no Coronary artery disease:  no  OBESITY Duration: several months Previous attempts at weight loss: yes Complications of obesity: Depression Peak weight: 251lbs (current) Weight loss goal: 180 Weight loss to date: none Requesting obesity pharmacotherapy: yes Current weight loss supplements/medications: yes- contrave  (doesn't seem to be working) Previous weight loss supplements/meds: yes wegovy  (not covered by insurance)  DEPRESSION Mood status: controlled Satisfied with current treatment?: yes Symptom severity: mild  Duration of current treatment : chronic Side effects: no Medication compliance: excellent compliance Psychotherapy/counseling: no  Previous psychiatric medications: prozac  Depressed mood: no Anxious mood: no Anhedonia: no Significant weight loss or gain: yes Insomnia: no  Fatigue: yes Feelings of worthlessness or guilt: no Impaired concentration/indecisiveness: no Suicidal ideations: no Hopelessness: no Crying spells: no    11/25/2023    4:29 PM 07/25/2023     2:51 PM 05/05/2023    3:37 PM 09/19/2022    4:16 PM 03/11/2022    4:34 PM  Depression screen PHQ 2/9  Decreased Interest 2 2 1 1 1   Down, Depressed, Hopeless 0 0 0 1 1  PHQ - 2 Score 2 2 1 2 2   Altered sleeping 2 2 1 1 3   Tired, decreased energy 2 2 1 1 3   Change in appetite 2 2 3 1 1   Feeling bad or failure about yourself  0 0 0 0 0  Trouble concentrating 0 0 1 1 1   Moving slowly or fidgety/restless 0 0 0 0 0  Suicidal thoughts 0 0 0 0 0  PHQ-9 Score 8 8  7  6  10    Difficult doing work/chores Not difficult at all Somewhat difficult Somewhat difficult Not difficult at all Somewhat difficult     Data saved with a previous flowsheet row definition    Relevant past medical, surgical, family and social history reviewed and updated as indicated. Interim medical history since our last visit reviewed. Allergies and medications reviewed and updated.  Review of Systems  Constitutional: Negative.   Respiratory: Negative.    Cardiovascular: Negative.   Musculoskeletal: Negative.   Neurological: Negative.   Psychiatric/Behavioral: Negative.      Per HPI unless specifically indicated above     Objective:    BP 132/82 (BP Location: Left Arm, Patient Position: Sitting, Cuff Size: Large)   Pulse (!) 104   Ht 5' 1.5 (1.562 m)   Wt 251 lb (113.9 kg)   SpO2 96%   BMI 46.66  kg/m   Wt Readings from Last 3 Encounters:  11/25/23 251 lb (113.9 kg)  07/25/23 240 lb (108.9 kg)  06/12/23 229 lb 8 oz (104.1 kg)    Physical Exam Vitals and nursing note reviewed.  Constitutional:      General: She is not in acute distress.    Appearance: Normal appearance. She is obese. She is not ill-appearing, toxic-appearing or diaphoretic.  HENT:     Head: Normocephalic and atraumatic.     Right Ear: External ear normal.     Left Ear: External ear normal.     Nose: Nose normal.     Mouth/Throat:     Mouth: Mucous membranes are moist.     Pharynx: Oropharynx is clear.  Eyes:     General: No  scleral icterus.       Right eye: No discharge.        Left eye: No discharge.     Extraocular Movements: Extraocular movements intact.     Conjunctiva/sclera: Conjunctivae normal.     Pupils: Pupils are equal, round, and reactive to light.  Cardiovascular:     Rate and Rhythm: Normal rate and regular rhythm.     Pulses: Normal pulses.     Heart sounds: Normal heart sounds. No murmur heard.    No friction rub. No gallop.  Pulmonary:     Effort: Pulmonary effort is normal. No respiratory distress.     Breath sounds: Normal breath sounds. No stridor. No wheezing, rhonchi or rales.  Chest:     Chest wall: No tenderness.  Musculoskeletal:        General: Normal range of motion.     Cervical back: Normal range of motion and neck supple.  Skin:    General: Skin is warm and dry.     Capillary Refill: Capillary refill takes less than 2 seconds.     Coloration: Skin is not jaundiced or pale.     Findings: No bruising, erythema, lesion or rash.  Neurological:     General: No focal deficit present.     Mental Status: She is alert and oriented to person, place, and time. Mental status is at baseline.  Psychiatric:        Mood and Affect: Mood normal.        Behavior: Behavior normal.        Thought Content: Thought content normal.        Judgment: Judgment normal.     Results for orders placed or performed during the hospital encounter of 06/12/23  Urinalysis, Routine w reflex microscopic -Urine, Clean Catch   Collection Time: 06/12/23 12:07 PM  Result Value Ref Range   Color, Urine YELLOW (A) YELLOW   APPearance CLOUDY (A) CLEAR   Specific Gravity, Urine 1.033 (H) 1.005 - 1.030   pH 7.0 5.0 - 8.0   Glucose, UA NEGATIVE NEGATIVE mg/dL   Hgb urine dipstick NEGATIVE NEGATIVE   Bilirubin Urine NEGATIVE NEGATIVE   Ketones, ur NEGATIVE NEGATIVE mg/dL   Protein, ur 899 (A) NEGATIVE mg/dL   Nitrite NEGATIVE NEGATIVE   Leukocytes,Ua NEGATIVE NEGATIVE   RBC / HPF 0-5 0 - 5 RBC/hpf    WBC, UA 0-5 0 - 5 WBC/hpf   Bacteria, UA FEW (A) NONE SEEN   Squamous Epithelial / HPF 11-20 0 - 5 /HPF   Mucus PRESENT    Amorphous Crystal PRESENT   Basic metabolic panel   Collection Time: 06/12/23 12:07 PM  Result Value Ref Range   Sodium  137 135 - 145 mmol/L   Potassium 4.1 3.5 - 5.1 mmol/L   Chloride 105 98 - 111 mmol/L   CO2 26 22 - 32 mmol/L   Glucose, Bld 93 70 - 99 mg/dL   BUN 15 6 - 20 mg/dL   Creatinine, Ser 9.21 0.44 - 1.00 mg/dL   Calcium 8.6 (L) 8.9 - 10.3 mg/dL   GFR, Estimated >39 >39 mL/min   Anion gap 6 5 - 15  CBC   Collection Time: 06/12/23 12:07 PM  Result Value Ref Range   WBC 6.8 4.0 - 10.5 K/uL   RBC 4.62 3.87 - 5.11 MIL/uL   Hemoglobin 13.1 12.0 - 15.0 g/dL   HCT 60.6 63.9 - 53.9 %   MCV 85.1 80.0 - 100.0 fL   MCH 28.4 26.0 - 34.0 pg   MCHC 33.3 30.0 - 36.0 g/dL   RDW 87.9 88.4 - 84.4 %   Platelets 328 150 - 400 K/uL   nRBC 0.0 0.0 - 0.2 %  Pregnancy, urine   Collection Time: 06/12/23 12:07 PM  Result Value Ref Range   Preg Test, Ur NEGATIVE NEGATIVE      Assessment & Plan:   Problem List Items Addressed This Visit       Other   Depression, recurrent - Primary   Under good control on current regimen. Continue current regimen. Continue to monitor. Call with any concerns. Refills given.        Relevant Medications   FLUoxetine  (PROZAC ) 20 MG tablet   Morbid obesity (HCC)   Was unable to get the wegovy . Would like to try out of pocket zepbound. Rx [facility]. Call with any concerns.       Relevant Medications   tirzepatide (ZEPBOUND) 2.5 MG/0.5ML injection vial   Other Visit Diagnoses       Snoring       Will refer her to pulmonology for ? Sleep apnea. Await their input.   Relevant Orders   Ambulatory referral to Pulmonology        Follow up plan: Return for As scheduled.    This visit was completed via video visit through MyChart due to the restrictions of the COVID-19 pandemic. All issues as above were  discussed and addressed. Physical exam was done as above through visual confirmation on video through MyChart. If it was felt that the patient should be evaluated in the office, they were directed there. The patient verbally consented to this visit. Location of the patient: home Location of the provider: work Those involved with this call:  Provider: Duwaine Louder, DO CMA: York Fogo, CMA, Front Desk/Registration: Claretta Maiden  Time spent on call: 25 minutes with patient face to face via video conference. More than 50% of this time was spent in counseling and coordination of care. 40 minutes total spent in review of patient's record and preparation of their chart.

## 2023-11-25 NOTE — Progress Notes (Signed)
 Chief Complaint: Right hip pain    Discussed the use of AI scribe software for clinical note transcription with the patient, who gave verbal consent to proceed.  History of Present Illness Breanna Lamb is a 33 year old female who presents with persistent right hip pain following a fall. She has experienced persistent right hip pain for two years, initially intermittent but now constant for the past two to three weeks.  2 years ago she sustained an injury while snow tubing which resulted in a suspected MCL injury to the knee.  The pain is primarily in the front of the hip and radiates to the groin.. It worsened after a fall from the attic two weeks ago, landing on the affected side. Occasional 'locking up' and popping sensations occur in the hip. Pain is exacerbated by hip rotation and lying on the affected side, preventing sleep on that side. Current management with ibuprofen  provides minimal relief. She has not engaged in formal physical therapy or other treatments, nor has she tried icing the area. There is no significant low back pain. Current management strategies have not provided significant relief.   Surgical History:   None  PMH/PSH/Family History/Social History/Meds/Allergies:    Past Medical History:  Diagnosis Date   Allergic rhinitis    Asthma    childhood   Benign positional vertigo    Bunion    Contraceptive management    Depression    Genital herpes    Genital herpes simplex virus (HSV) infection in mother affecting pregnancy 07/29/2018   Henoch-Schonlein purpura    Low-lying placenta 11/19/2018   Resolved 23 week anatomy follow up   Lymphadenopathy    Neck pain    Obesity    Ovarian cyst, right    Poor concentration    Premenstrual dysphoric syndrome    Premenstrual dysphoric syndrome    Screening for venereal disease    Skin lesion    Past Surgical History:  Procedure Laterality Date   BUNIONECTOMY  2010 and 2011    CESAREAN SECTION     CESAREAN SECTION WITH BILATERAL TUBAL LIGATION  04/08/2019   Procedure: CESAREAN SECTION WITH BILATERAL TUBAL LIGATION;  Surgeon: Lake Read, MD;  Location: [facility] ORS;  Service: Obstetrics;;   mirena IUD  01/2014   OVARIAN CYST REMOVAL  August 2014   TUBAL LIGATION  04/2019   WISDOM TOOTH EXTRACTION  2014   Social History   Socioeconomic History   Marital status: Married    Spouse name: Risk Analyst   Number of children: 3   Years of education: Not on file   Highest education level: Not on file  Occupational History   Not on file  Tobacco Use   Smoking status: Never   Smokeless tobacco: Never  Vaping Use   Vaping status: Never Used  Substance and Sexual Activity   Alcohol use: No   Drug use: No   Sexual activity: Yes    Birth control/protection: Surgical    Comment: Tubal Ligation  Other Topics Concern   Not on file  Social History Narrative   Not on file   Social Drivers of Health   Financial Resource Strain: Not on file  Food Insecurity: Not on file  Transportation Needs: Not on file  Physical Activity: Not on file  Stress: Not on file  Social Connections: Not on file   Family History  Problem Relation Age of Onset   Hypertension Mother    Mental illness Mother        Depression, Anxiety   Hypertension Father    Hypertension Brother    Hypertension Maternal Grandmother    Diabetes Maternal Grandmother    Hyperlipidemia Maternal Grandmother    Heart disease Maternal Grandmother    COPD Maternal Grandmother    Kidney disease Maternal Grandmother    Mental illness Maternal Grandmother    Hypertension Sister    Mental illness Sister    Heart disease Maternal Grandfather    Heart attack Maternal Grandfather    Cancer Paternal Grandmother    Allergies  Allergen Reactions   Nickel Rash   Current Outpatient Medications  Medication Sig Dispense Refill   acetaminophen  (TYLENOL ) 325 MG tablet Take 650 mg by mouth every 6 (six) hours as  needed for moderate pain.     Albuterol -Budesonide  (AIRSUPRA ) 90-80 MCG/ACT AERO Inhale 2 puffs into the lungs every 4 (four) hours as needed. 5.9 g 0   amoxicillin -clavulanate (AUGMENTIN) 875-125 MG tablet Take 1 tablet by mouth 2 (two) times daily. 14 tablet 0   cyclobenzaprine  (FLEXERIL ) 5 MG tablet Take 1 tablet (5 mg total) by mouth 3 (three) times daily as needed. 30 tablet 0   FLUoxetine  (PROZAC ) 20 MG tablet Take 3 tablets (60 mg total) by mouth daily. 270 tablet 1   Semaglutide -Weight Management (WEGOVY ) 0.25 MG/0.5ML SOAJ Inject 0.25 mg into the skin once a week. 2 mL 0   Semaglutide -Weight Management (WEGOVY ) 0.5 MG/0.5ML SOAJ Inject 0.5 mg into the skin once a week. 2 mL 0   Semaglutide -Weight Management (WEGOVY ) 1 MG/0.5ML SOAJ Inject 1 mg into the skin once a week. 2 mL 1   Vitamin D , Ergocalciferol , (DRISDOL ) 1.25 MG (50000 UNIT) CAPS capsule Take 1 capsule (50,000 Units total) by mouth every 7 (seven) days. 12 capsule 1   No current facility-administered medications for this visit.   No results found.  Review of Systems:   A ROS was performed including pertinent positives and negatives as documented in the HPI.  Physical Exam :   Constitutional: NAD and appears stated age Neurological: Alert and oriented Psych: Appropriate affect and cooperative There were no vitals taken for this visit.   Comprehensive Musculoskeletal Exam:    Some tenderness with palpation over the anterior right hip.  Passive hip range of motion to 110 degrees flexion and 20 degrees of internal and external rotation.  Anterior hip pain with FABER and positive FADIR.  Mild discomfort with resisted hip flexion.  No pinpoint tenderness over the greater trochanter.  Imaging:   Xray (AP pelvis, right hip 3 views): Negative for bony abnormality and well-maintained joint spacing.   I personally reviewed and interpreted the radiographs.      Assessment & Plan Acute on chronic right hip pain Patient  continues to experience chronic right hip pain after an injury 2 years ago which was recently exacerbated after a fall 2 weeks ago down multiple stairs onto her right side.  She is experiencing persistent groin pain as well as mechanical symptoms suggesting a labral tear.  There is some reproducible tenderness over the anterior hip which may suggest involvement of the hip flexors however I do not believe this is the main pain generator.  Given the presence of popping and catching within the hip, discussed that I would like to proceed with an MRI arthrogram for further  evaluation of the labrum.  Provided gentle hip stretching exercises she can perform at home as tolerated.  Would like to have her come back shortly after MRI for review and treatment discussion and we will consider physical therapy and potential injections if nonoperative management is preferred.  Continue ibuprofen  as needed for pain management.       I personally saw and evaluated the patient, and participated in the management and treatment plan.  Leonce Reveal, PA-C Orthopedics

## 2023-11-25 NOTE — Assessment & Plan Note (Signed)
 Was unable to get the wegovy . Would like to try out of pocket zepbound. Rx [facility]. Call with any concerns.

## 2023-11-25 NOTE — Assessment & Plan Note (Signed)
 Under good control on current regimen. Continue current regimen. Continue to monitor. Call with any concerns. Refills given.

## 2023-11-26 ENCOUNTER — Other Ambulatory Visit: Payer: Self-pay

## 2023-11-26 ENCOUNTER — Telehealth

## 2023-11-27 ENCOUNTER — Telehealth: Admitting: Physician Assistant

## 2023-11-27 DIAGNOSIS — R3989 Other symptoms and signs involving the genitourinary system: Secondary | ICD-10-CM

## 2023-11-27 MED ORDER — CEPHALEXIN 500 MG PO CAPS: 500.0000 mg | ORAL_CAPSULE | Freq: Two times a day (BID) | ORAL | 0 refills | Status: AC

## 2023-11-27 NOTE — Patient Instructions (Signed)
 Breanna Lamb, thank you for joining Breanna Velma Lunger, PA-C for today's virtual visit.  While this provider is not your primary care provider (PCP), if your PCP is located in our provider database this encounter information will be shared with them immediately following your visit.   Breanna Lamb account gives you access to today's visit and all your visits, tests, and labs performed at [facility]  click here if you don't have Breanna Breanna Lamb account or go to [email or web address]  Consent: (Patient) Breanna Lamb provided verbal consent for this virtual visit at the beginning of the encounter.  Current Medications:  Current Outpatient Medications:    acetaminophen  (TYLENOL ) 325 MG tablet, Take 650 mg by mouth every 6 (six) hours as needed for moderate pain., Disp: , Rfl:    Albuterol -Budesonide  (AIRSUPRA ) 90-80 MCG/ACT AERO, Inhale 2 puffs into the lungs every 4 (four) hours as needed., Disp: 5.9 g, Rfl: 0   cyclobenzaprine  (FLEXERIL ) 5 MG tablet, Take 1 tablet (5 mg total) by mouth 3 (three) times daily as needed., Disp: 30 tablet, Rfl: 0   FLUoxetine  (PROZAC ) 20 MG tablet, Take 3 tablets (60 mg total) by mouth daily., Disp: 270 tablet, Rfl: 1   tirzepatide (ZEPBOUND) 2.5 MG/0.5ML injection vial, Inject 2.5 mg into the skin once Breanna week., Disp: 2 mL, Rfl: 0   Vitamin D , Ergocalciferol , (DRISDOL ) 1.25 MG (50000 UNIT) CAPS capsule, Take 1 capsule (50,000 Units total) by mouth every 7 (seven) days., Disp: 12 capsule, Rfl: 1   Medications ordered in this encounter:  No orders of the defined types were placed in this encounter.    *If you need refills on other medications prior to your next appointment, please contact your pharmacy*  Follow-Up: Call back or seek an in-person evaluation if the symptoms worsen or if the condition fails to improve as anticipated.   Virtual Care [phone]  Other Instructions Your symptoms are consistent  with Breanna bladder infection, also called acute cystitis. Please take your antibiotic (Keflex) as directed until all pills are gone.  Stay very well hydrated.  Consider Breanna daily probiotic (Align, Culturelle, or Activia) to help prevent stomach upset caused by the antibiotic.  Taking Breanna probiotic daily may also help prevent recurrent UTIs.  Also consider taking AZO (Phenazopyridine) tablets to help decrease pain with urination.  If you note any non-resolving, new, or worsening symptoms despite treatment, please seek an in-person evaluation ASAP.  Urinary Tract Infection Breanna urinary tract infection (UTI) can occur any place along the urinary tract. The tract includes the kidneys, ureters, bladder, and urethra. Breanna type of germ called bacteria often causes Breanna UTI. UTIs are often helped with antibiotic medicine.  HOME CARE  If given, take antibiotics as told by your doctor. Finish them even if you start to feel better. Drink enough fluids to keep your pee (urine) clear or pale yellow. Avoid tea, drinks with caffeine, and bubbly (carbonated) drinks. Pee often. Avoid holding your pee in for Breanna long time. Pee before and after having sex (intercourse). Wipe from front to back after you poop (bowel movement) if you are Breanna woman. Use each tissue only once. GET HELP RIGHT AWAY IF:  You have back pain. You have lower belly (abdominal) pain. You have chills. You feel sick to your stomach (nauseous). You throw up (vomit). Your burning or discomfort with peeing does not go away. You have Breanna fever. Your symptoms are not better in 3 days. MAKE SURE YOU:  Understand these instructions. Will watch your condition. Will get help right away if you are not doing well or get worse. Document Released: 06/12/2007 Document Revised: 09/18/2011 Document Reviewed: 07/25/2011 [facility] Patient Information 2015 [place], Breanna Lamb. This information is not intended to replace advice given to you by your health care provider. Make sure you  discuss any questions you have with your health care provider.    If you have been instructed to have an in-person evaluation today at [facility] local Urgent Care facility, please use the link below. It will take you to Breanna list of all of our available [facility], including address, phone number and hours of operation. Please do not delay care.  [facility]  If you or Breanna family member do not have Breanna primary care provider, use the link below to schedule Breanna visit and establish care. When you choose [facility]anna [place] primary care physician or advanced practice provider, you gain Breanna long-term partner in health. Find Breanna Primary Care Provider  Learn more about [facility]'s in-office and virtual care options: [facility] - Get Care Now

## 2023-11-27 NOTE — Progress Notes (Signed)
 Virtual Visit Consent   Breanna Lamb, you are scheduled for a virtual visit with a [facility] provider today. Just as with appointments in the office, your consent must be obtained to participate. Your consent will be active for this visit and any virtual visit you may have with one of our providers in the next 365 days. If you have a MyChart account, a copy of this consent can be sent to you electronically.  As this is a virtual visit, video technology does not allow for your provider to perform a traditional examination. This may limit your provider's ability to fully assess your condition. If your provider identifies any concerns that need to be evaluated in person or the need to arrange testing (such as labs, EKG, etc.), we will make arrangements to do so. Although advances in technology are sophisticated, we cannot ensure that it will always work on either your end or our end. If the connection with a video visit is poor, the visit may have to be switched to a telephone visit. With either a video or telephone visit, we are not always able to ensure that we have a secure connection.  By engaging in this virtual visit, you consent to the provision of healthcare and authorize for your insurance to be billed (if applicable) for the services provided during this visit. Depending on your insurance coverage, you may receive a charge related to this service.  I need to obtain your verbal consent now. Are you willing to proceed with your visit today? CORNESHIA HINES has provided verbal consent on 11/27/2023 for a virtual visit (video or telephone). Breanna Lamb, [place]  Date: 11/27/2023 6:07 PM   Virtual Visit via Video Note   I, Breanna Lamb, connected with  [name]  ([id number], Jun 16, 1990) on 11/27/23 at  6:00 PM EST by a video-enabled telemedicine application and verified that I am speaking with the correct person using two identifiers.  Location: Patient: Virtual Visit  Location Patient: Home Provider: Virtual Visit Location Provider: Home Office   I discussed the limitations of evaluation and management by telemedicine and the availability of in person appointments. The patient expressed understanding and agreed to proceed.    History of Present Illness: Breanna Lamb is a 33 y.o. who identifies as a female who was assigned female at birth, and is being seen today for possible UTI. Endorses symptoms starting 2 weeks ago with intermittent dysuria, improving originally but recurring over past 2 days. Denies nausea/vomiting. Some left lower back pain. LMP ended last week.   HPI: HPI  Problems:  Patient Active Problem List   Diagnosis Date Noted   Morbid obesity (HCC) 03/23/2023   Vitamin B12 deficiency 09/19/2022   Vitamin D  deficiency 09/19/2022   Depression, recurrent 01/03/2020   Chronic tension-type headache, not intractable 01/31/2016    Allergies:  Allergies  Allergen Reactions   Nickel Rash   Medications:  Current Outpatient Medications:    cephALEXin  (KEFLEX ) 500 MG capsule, Take 1 capsule (500 mg total) by mouth 2 (two) times daily for 7 days., Disp: 14 capsule, Rfl: 0   acetaminophen  (TYLENOL ) 325 MG tablet, Take 650 mg by mouth every 6 (six) hours as needed for moderate pain., Disp: , Rfl:    Albuterol -Budesonide  (AIRSUPRA ) 90-80 MCG/ACT AERO, Inhale 2 puffs into the lungs every 4 (four) hours as needed., Disp: 5.9 g, Rfl: 0   cyclobenzaprine  (FLEXERIL ) 5 MG tablet, Take 1 tablet (5 mg total) by mouth 3 (three)  times daily as needed., Disp: 30 tablet, Rfl: 0   FLUoxetine  (PROZAC ) 20 MG tablet, Take 3 tablets (60 mg total) by mouth daily., Disp: 270 tablet, Rfl: 1   tirzepatide (ZEPBOUND) 2.5 MG/0.5ML injection vial, Inject 2.5 mg into the skin once a week., Disp: 2 mL, Rfl: 0   Vitamin D , Ergocalciferol , (DRISDOL ) 1.25 MG (50000 UNIT) CAPS capsule, Take 1 capsule (50,000 Units total) by mouth every 7 (seven) days., Disp: 12 capsule, Rfl:  1  Observations/Objective: Patient is well-developed, well-nourished in no acute distress.  Resting comfortably  at home.  Head is normocephalic, atraumatic.  No labored breathing.  Speech is clear and coherent with logical content.  Patient is alert and oriented at baseline.   Assessment and Plan: 1. Suspected UTI (Primary) - cephALEXin (KEFLEX) 500 MG capsule; Take 1 capsule (500 mg total) by mouth 2 (two) times daily for 7 days.  Dispense: 14 capsule; Refill: 0  Classic UTI symptoms with absence of alarm signs or symptoms. Prior history of UTI. Will treat empirically with Keflex for suspected uncomplicated cystitis. Supportive measures and OTC medications reviewed. Strict in-person evaluation precautions discussed.    Follow Up Instructions: I discussed the assessment and treatment plan with the patient. The patient was provided an opportunity to ask questions and all were answered. The patient agreed with the plan and demonstrated an understanding of the instructions.  A copy of instructions were sent to the patient via MyChart unless otherwise noted below.   The patient was advised to call back or seek an in-person evaluation if the symptoms worsen or if the condition fails to improve as anticipated.    Breanna Velma Lunger, PA-C

## 2023-12-09 ENCOUNTER — Encounter (HOSPITAL_BASED_OUTPATIENT_CLINIC_OR_DEPARTMENT_OTHER): Payer: Self-pay | Admitting: Student

## 2023-12-19 ENCOUNTER — Other Ambulatory Visit

## 2023-12-23 ENCOUNTER — Inpatient Hospital Stay
Admission: RE | Admit: 2023-12-23 | Discharge: 2023-12-23 | Disposition: A | Source: Ambulatory Visit | Attending: Student

## 2023-12-23 ENCOUNTER — Ambulatory Visit
Admission: RE | Admit: 2023-12-23 | Discharge: 2023-12-23 | Disposition: A | Source: Ambulatory Visit | Attending: Student

## 2023-12-23 DIAGNOSIS — S73191A Other sprain of right hip, initial encounter: Secondary | ICD-10-CM | POA: Diagnosis not present

## 2023-12-23 DIAGNOSIS — M25551 Pain in right hip: Secondary | ICD-10-CM

## 2023-12-23 MED ORDER — GADOBENATE DIMEGLUMINE 529 MG/ML IV SOLN
0.1000 mL | Freq: Once | INTRAVENOUS | Status: AC
  Administered 2023-12-23: 16:00:00 0.1 mL via INTRA_ARTICULAR

## 2023-12-23 MED ORDER — GADOBENATE DIMEGLUMINE 529 MG/ML IV SOLN: 0.1000 mL | Freq: Once | INTRAVENOUS | Status: DC | PRN

## 2023-12-23 MED ORDER — IOPAMIDOL (ISOVUE-M 200) INJECTION 41%
10.0000 mL | Freq: Once | INTRAMUSCULAR | Status: AC
  Administered 2023-12-23: 16:00:00 10 mL via INTRA_ARTICULAR

## 2023-12-26 ENCOUNTER — Ambulatory Visit (HOSPITAL_BASED_OUTPATIENT_CLINIC_OR_DEPARTMENT_OTHER): Admitting: Student

## 2023-12-26 ENCOUNTER — Ambulatory Visit (INDEPENDENT_AMBULATORY_CARE_PROVIDER_SITE_OTHER): Admitting: Student

## 2023-12-26 DIAGNOSIS — S73191A Other sprain of right hip, initial encounter: Secondary | ICD-10-CM

## 2023-12-26 NOTE — Progress Notes (Unsigned)
 "                                Chief Complaint: Right hip pain    History of Present Illness  12/26/23: Patient presents today for MRI review of her right hip.   11/25/23: Breanna Lamb is a 33 year old female who presents with persistent right hip pain following a fall. She has experienced persistent right hip pain for two years, initially intermittent but now constant for the past two to three weeks.  2 years ago she sustained an injury while snow tubing which resulted in a suspected MCL injury to the knee.  The pain is primarily in the front of the hip and radiates to the groin.. It worsened after a fall from the attic two weeks ago, landing on the affected side. Occasional 'locking up' and popping sensations occur in the hip. Pain is exacerbated by hip rotation and lying on the affected side, preventing sleep on that side. Current management with ibuprofen  provides minimal relief. She has not engaged in formal physical therapy or other treatments, nor has she tried icing the area. There is no significant low back pain. Current management strategies have not provided significant relief.   Surgical History:   None  PMH/PSH/Family History/Social History/Meds/Allergies:    Past Medical History:  Diagnosis Date   Allergic rhinitis    Asthma    childhood   Benign positional vertigo    Bunion    Contraceptive management    Depression    Genital herpes    Genital herpes simplex virus (HSV) infection in mother affecting pregnancy 07/29/2018   Henoch-Schonlein purpura    Low-lying placenta 11/19/2018   Resolved 23 week anatomy follow up   Lymphadenopathy    Neck pain    Obesity    Ovarian cyst, right    Poor concentration    Premenstrual dysphoric syndrome    Premenstrual dysphoric syndrome    Screening for venereal disease    Skin lesion    Past Surgical History:  Procedure Laterality Date   BUNIONECTOMY  2010 and 2011   CESAREAN SECTION     CESAREAN SECTION WITH  BILATERAL TUBAL LIGATION  04/08/2019   Procedure: CESAREAN SECTION WITH BILATERAL TUBAL LIGATION;  Surgeon: Lake Read, MD;  Location: [facility] ORS;  Service: Obstetrics;;   mirena IUD  01/2014   OVARIAN CYST REMOVAL  August 2014   TUBAL LIGATION  04/2019   WISDOM TOOTH EXTRACTION  2014   Social History   Socioeconomic History   Marital status: Married    Spouse name: Risk Analyst   Number of children: 3   Years of education: Not on file   Highest education level: Not on file  Occupational History   Not on file  Tobacco Use   Smoking status: Never   Smokeless tobacco: Never  Vaping Use   Vaping status: Never Used  Substance and Sexual Activity   Alcohol use: No   Drug use: No   Sexual activity: Yes    Birth control/protection: Surgical    Comment: Tubal Ligation  Other Topics Concern   Not on file  Social History Narrative   Not on file   Social Drivers of Health   Tobacco Use: Low Risk (11/25/2023)   Patient History    Smoking Tobacco Use: Never    Smokeless Tobacco Use: Never    Passive Exposure: Not on file  Financial Resource Strain:  Not on file  Food Insecurity: Not on file  Transportation Needs: Not on file  Physical Activity: Not on file  Stress: Not on file  Social Connections: Not on file  Depression (PHQ2-9): Medium Risk (11/25/2023)   Depression (PHQ2-9)    PHQ-2 Score: 8  Alcohol Screen: Not on file  Housing: Not on file  Utilities: Not on file  Health Literacy: Not on file   Family History  Problem Relation Age of Onset   Hypertension Mother    Mental illness Mother        Depression, Anxiety   Hypertension Father    Hypertension Brother    Hypertension Maternal Grandmother    Diabetes Maternal Grandmother    Hyperlipidemia Maternal Grandmother    Heart disease Maternal Grandmother    COPD Maternal Grandmother    Kidney disease Maternal Grandmother    Mental illness Maternal Grandmother    Hypertension Sister    Mental illness Sister     Heart disease Maternal Grandfather    Heart attack Maternal Grandfather    Cancer Paternal Grandmother    Allergies  Allergen Reactions   Nickel Rash   Current Outpatient Medications  Medication Sig Dispense Refill   acetaminophen  (TYLENOL ) 325 MG tablet Take 650 mg by mouth every 6 (six) hours as needed for moderate pain.     Albuterol -Budesonide  (AIRSUPRA ) 90-80 MCG/ACT AERO Inhale 2 puffs into the lungs every 4 (four) hours as needed. 5.9 g 0   cyclobenzaprine  (FLEXERIL ) 5 MG tablet Take 1 tablet (5 mg total) by mouth 3 (three) times daily as needed. 30 tablet 0   FLUoxetine  (PROZAC ) 20 MG tablet Take 3 tablets (60 mg total) by mouth daily. 270 tablet 1   tirzepatide  (ZEPBOUND ) 2.5 MG/0.5ML injection vial Inject 2.5 mg into the skin once a week. 2 mL 0   Vitamin D , Ergocalciferol , (DRISDOL ) 1.25 MG (50000 UNIT) CAPS capsule Take 1 capsule (50,000 Units total) by mouth every 7 (seven) days. 12 capsule 1   No current facility-administered medications for this visit.   No results found.  Review of Systems:   A ROS was performed including pertinent positives and negatives as documented in the HPI.  Physical Exam :   Constitutional: NAD and appears stated age Neurological: Alert and oriented Psych: Appropriate affect and cooperative There were no vitals taken for this visit.   Comprehensive Musculoskeletal Exam:    Some tenderness with palpation over the anterior right hip.  Passive hip range of motion to 110 degrees flexion and 20 degrees of internal and external rotation.  Anterior hip pain with FABER and positive FADIR.  Mild discomfort with resisted hip flexion.  No pinpoint tenderness over the greater trochanter.  Imaging:   MRI arthrogram right hip: Right anterior superior labrum tear   I personally reviewed and interpreted the radiographs.      Assessment & Plan Acute on chronic right hip pain      Patient continues to experience chronic right hip pain after an  injury 2 years ago which was recently exacerbated after a fall 2 weeks ago down multiple stairs onto her right side.  She is experiencing persistent groin pain as well as mechanical symptoms suggesting a labral tear.  There is some reproducible tenderness over the anterior hip which may suggest involvement of the hip flexors however I do not believe this is the main pain generator.  Given the presence of popping and catching within the hip, discussed that I would like to proceed with  an MRI arthrogram for further evaluation of the labrum.  Provided gentle hip stretching exercises she can perform at home as tolerated.  Would like to have her come back shortly after MRI for review and treatment discussion and we will consider physical therapy and potential injections if nonoperative management is preferred.  Continue ibuprofen  as needed for pain management.       I personally saw and evaluated the patient, and participated in the management and treatment plan.  Leonce Reveal, PA-C Orthopedics  "

## 2024-01-22 ENCOUNTER — Ambulatory Visit: Admitting: Primary Care

## 2024-01-22 ENCOUNTER — Encounter: Payer: Self-pay | Admitting: Primary Care

## 2024-01-22 VITALS — BP 102/68 | HR 75 | Ht 61.0 in | Wt 248.0 lb

## 2024-01-22 DIAGNOSIS — G4710 Hypersomnia, unspecified: Secondary | ICD-10-CM

## 2024-01-22 DIAGNOSIS — R0683 Snoring: Secondary | ICD-10-CM

## 2024-01-22 DIAGNOSIS — Z6841 Body Mass Index (BMI) 40.0 and over, adult: Secondary | ICD-10-CM

## 2024-01-22 DIAGNOSIS — G4719 Other hypersomnia: Secondary | ICD-10-CM

## 2024-01-22 NOTE — Patient Instructions (Addendum)
" ° °  VISIT SUMMARY: Today, you came in for a sleep consultation due to experiencing non-restorative sleep and significant daytime sleepiness. You mentioned that you wake up twice during the night to use the bathroom and often feel unrefreshed in the morning. You also reported loud snoring, occasional migraines, and weight gain, which you believe has worsened your sleep issues. We discussed your previous WatchPat exam results and your current symptoms.  YOUR PLAN: -OBSTRUCTIVE SLEEP APNEA: Obstructive sleep apnea is a condition where your breathing repeatedly stops and starts during sleep. Your previous WatchPat exam indicated mild sleep apnea. We have ordered a home sleep study to reconfirm the diagnosis. If the sleep apnea is moderate or severe, we will focus on weight loss as a treatment option since you prefer this over using a CPAP machine.  -OBESITY: Obesity is a condition where you have an excessive amount of body fat, which can worsen sleep apnea symptoms. You are starting Zepbound  at a dose of 2.5 mg for weight management. We discussed potential side effects of this medication, including constipation, nausea, and vomiting, and how to manage them. We also talked about an alternative medication, Rybelsus , if needed.  INSTRUCTIONS: Please complete the home sleep study as ordered to reconfirm your sleep apnea diagnosis. Start taking Zepbound  2.5 mg for weight management and follow the titration schedule up to 10-15 mg as tolerated. Use MiraLAX  for constipation if needed and make dietary modifications to manage nausea. If you experience any issues or need an alternative medication, discuss Rybelsus  with your primary care provider.  Orders: Home sleep   Follow-up: As needed         "

## 2024-01-22 NOTE — Progress Notes (Signed)
 "  @Patient  ID: Breanna Lamb, female    DOB: [birth year], 34 y.o.   MRN: [id number]  Chief Complaint  Patient presents with   Consult    Sleep-snoring    Referring provider: Vicci Duwaine SQUIBB, DO  HPI: 34 year old female, never smoked. PMH significant for chronic tension-type headache, vit B12 and vit D deficiency, depression and obesity.   01/22/2024 Discussed the use of AI scribe software for clinical note transcription with the patient, who gave verbal consent to proceed.  History of Present Illness Breanna Lamb is a 34 year old female who presents for a sleep consultation.  She experiences non-restorative sleep, loud snoring and moderate daytime sleepiness. Epworth score 12/24. Her sleep schedule involves going to bed between 9:30 PM and 11:00 PM, falling asleep within five minutes, but waking up twice during the night to use the bathroom. She starts her day at 6:30 AM but does not feel rested and often looks forward to going back to bed. This pattern is consistent on weekends, where she desires to sleep all day.  Approximately four months ago, she underwent a WatchPat home sleep exam through work, and she recalls being told the AHI was 14. She has not had a formal sleep consult before this visit. She reports loud snoring and occasional migraines, with headaches sometimes occurring upon waking, though not daily.  She has gained weight, which she believes has worsened her sleep issues. Her lowest weight was around 170 pounds when on Wegovy . She is starting Zepbound  at a dose of 2.5 mg for weight management.  In the review of symptoms, she confirms non-restorative sleep, daytime sleepiness, snoring, and weight gain. She occasionally experiences dry mouth and migraines.   Allergies[1]  Immunization History  Administered Date(s) Administered   DTaP 09/07/1991, 11/12/1991, 01/31/1992, 08/18/1996   HIB (PRP-OMP) 09/07/1991, 11/12/1991, 01/31/1992   HPV Bivalent 10/31/2005,  04/07/2006, 12/22/2007   Hepatitis B 08/18/1996   IPV 09/07/1991, 11/12/1991, 08/18/1996   Influenza,inj,Quad PF,6+ Mos 11/25/2018, 10/23/2022   Influenza-Unspecified 10/22/2015, 11/02/2019, 10/17/2021, 10/21/2023   MMR 08/18/1996   Meningococcal polysaccharide vaccine (MPSV4) 12/22/2007   PFIZER(Purple Top)SARS-COV-2 Vaccination 09/16/2019, 10/06/2019   Tdap 12/22/2007, 08/07/2013, 02/08/2019    Past Medical History:  Diagnosis Date   Allergic rhinitis    Asthma    childhood   Benign positional vertigo    Bunion    Contraceptive management    Depression    Genital herpes    Genital herpes simplex virus (HSV) infection in mother affecting pregnancy 07/29/2018   Henoch-Schonlein purpura    Low-lying placenta 11/19/2018   Resolved 23 week anatomy follow up   Lymphadenopathy    Neck pain    Obesity    Ovarian cyst, right    Poor concentration    Premenstrual dysphoric syndrome    Premenstrual dysphoric syndrome    Screening for venereal disease    Skin lesion     Tobacco History: Tobacco Use History[2] Counseling given: Not Answered   Outpatient Medications Prior to Visit  Medication Sig Dispense Refill   acetaminophen  (TYLENOL ) 325 MG tablet Take 650 mg by mouth every 6 (six) hours as needed for moderate pain.     Albuterol -Budesonide  (AIRSUPRA ) 90-80 MCG/ACT AERO Inhale 2 puffs into the lungs every 4 (four) hours as needed. 5.9 g 0   cyclobenzaprine  (FLEXERIL ) 5 MG tablet Take 1 tablet (5 mg total) by mouth 3 (three) times daily as needed. 30 tablet 0   FLUoxetine  (PROZAC ) 20 MG tablet Take  3 tablets (60 mg total) by mouth daily. 270 tablet 1   tirzepatide  (ZEPBOUND ) 2.5 MG/0.5ML injection vial Inject 2.5 mg into the skin once a week. 2 mL 0   Vitamin D , Ergocalciferol , (DRISDOL ) 1.25 MG (50000 UNIT) CAPS capsule Take 1 capsule (50,000 Units total) by mouth every 7 (seven) days. 12 capsule 1   No facility-administered medications prior to visit.      Review of  Systems  Review of Systems  Constitutional:  Positive for fatigue and unexpected weight change.  Psychiatric/Behavioral:  Positive for sleep disturbance.     Physical Exam  BP 102/68   Pulse 75   Ht 5' 1 (1.549 m)   Wt 248 lb (112.5 kg)   SpO2 97% Comment: RA  BMI 46.86 kg/m  Physical Exam Constitutional:      Appearance: Normal appearance. She is well-developed.  HENT:     Head: Normocephalic and atraumatic.     Mouth/Throat:     Mouth: Mucous membranes are moist.     Pharynx: Oropharynx is clear.  Eyes:     Pupils: Pupils are equal, round, and reactive to light.  Cardiovascular:     Rate and Rhythm: Normal rate and regular rhythm.     Heart sounds: Normal heart sounds. No murmur heard. Pulmonary:     Effort: Pulmonary effort is normal. No respiratory distress.     Breath sounds: Normal breath sounds. No wheezing or rhonchi.  Musculoskeletal:        General: Normal range of motion.  Skin:    General: Skin is warm and dry.     Findings: No erythema or rash.  Neurological:     General: No focal deficit present.     Mental Status: She is alert and oriented to person, place, and time. Mental status is at baseline.  Psychiatric:        Mood and Affect: Mood normal.        Behavior: Behavior normal.        Thought Content: Thought content normal.        Judgment: Judgment normal.      Lab Results:  CBC    Component Value Date/Time   WBC 6.8 06/12/2023 1207   RBC 4.62 06/12/2023 1207   HGB 13.1 06/12/2023 1207   HGB 13.0 03/11/2022 1651   HCT 39.3 06/12/2023 1207   HCT 38.9 03/11/2022 1651   PLT 328 06/12/2023 1207   PLT 286 03/11/2022 1651   MCV 85.1 06/12/2023 1207   MCV 86 03/11/2022 1651   MCV 86 10/24/2013 0951   MCH 28.4 06/12/2023 1207   MCHC 33.3 06/12/2023 1207   RDW 12.0 06/12/2023 1207   RDW 12.3 03/11/2022 1651   RDW 13.5 10/24/2013 0951   LYMPHSABS 2.5 01/16/2023 1305   LYMPHSABS 3.0 03/11/2022 1651   LYMPHSABS 2.5 10/05/2013 1854    MONOABS 0.5 01/16/2023 1305   MONOABS 1.0 (H) 10/05/2013 1854   EOSABS 0.1 01/16/2023 1305   EOSABS 0.1 03/11/2022 1651   EOSABS 0.1 10/05/2013 1854   BASOSABS 0.0 01/16/2023 1305   BASOSABS 0.1 03/11/2022 1651   BASOSABS 0.1 10/05/2013 1854    BMET    Component Value Date/Time   NA 137 06/12/2023 1207   NA 139 03/11/2022 1651   NA 139 10/24/2013 0951   K 4.1 06/12/2023 1207   K 4.0 10/24/2013 0951   CL 105 06/12/2023 1207   CL 107 10/24/2013 0951   CO2 26 06/12/2023 1207   CO2 23  10/24/2013 0951   GLUCOSE 93 06/12/2023 1207   GLUCOSE 99 10/24/2013 0951   BUN 15 06/12/2023 1207   BUN 9 03/11/2022 1651   BUN 9 10/24/2013 0951   CREATININE 0.78 06/12/2023 1207   CREATININE 0.86 10/24/2013 0951   CALCIUM 8.6 (L) 06/12/2023 1207   CALCIUM 8.4 (L) 10/24/2013 0951   GFRNONAA >60 06/12/2023 1207   GFRNONAA >60 10/24/2013 0951   GFRNONAA >60 09/05/2012 0950   GFRAA >60 03/26/2019 1922   GFRAA >60 10/24/2013 0951   GFRAA >60 09/05/2012 0950    BNP    Component Value Date/Time   BNP 25.0 03/27/2019 0003    ProBNP No results found for: PROBNP  Imaging: MR HIP RIGHT W CONTRAST Result Date: 12/24/2023 CLINICAL DATA:  Right hip pain for 1.5 years EXAM: MRI OF THE RIGHT HIP WITH CONTRAST (MR Arthrogram) TECHNIQUE: Multiplanar, multisequence MR imaging of the hip was performed immediately following contrast injection into the hip joint under fluoroscopic guidance. No intravenous contrast was administered. COMPARISON:  None Available. FINDINGS: Bone No hip fracture, dislocation or avascular necrosis. No aggressive osseous lesion. SI joints are normal. No SI joint widening or erosive changes. Lower lumbar spine demonstrates no focal abnormality. Alignment Normal. No subluxation. Dysplasia None. Joint effusion Intraarticular contrast distends the right hip joint capsule. Otherwise, no joint effusions. Labrum Right anterior labral tear extending into the anterosuperior labrum.  Cartilage No chondral defect. Capsule and ligaments Normal. Muscles and Tendons Flexors: Normal. Extensors: Normal. Abductors: Normal. Adductors: Normal. Rotators: Normal. Hamstrings: Normal. Other Findings No bursal fluid. Viscera No abnormality seen in pelvis. No lymphadenopathy. No free fluid in the pelvis. IMPRESSION: 1. Right anterior labral tear extending into the anterosuperior labrum. No focal chondral abnormality. 2. No hip fracture, dislocation or avascular necrosis. Electronically Signed   By: Julaine Blanch M.D.   On: 12/24/2023 12:30   DG FLUORO GUIDED NEEDLE PLC ASPIRATION/INJECTION LOC Result Date: 12/23/2023 CLINICAL DATA:  34 year old female with right hip pain for 2 months. EXAM: RIGHT HIP INJECTION FOR MRI FLUOROSCOPY: Radiation Exposure Index (as provided by the fluoroscopic device): 9 mGy Kerma PROCEDURE: The overlying skin was prepped with Betadine, draped in the usual sterile fashion, and infiltrated locally with 1% lidocaine . A 3.5 inch 22 gauge spinal needle was advanced to the lateral aspect of the right femoral head-neck junction. 1 mL of 1% lidocaine  injected easily. A mixture of 0.1 mL of MultiHance , 10 mL of Isovue -M 200, and 10 mL of sterile saline was then used to opacify the right hip joint. The needle was removed and a sterile dressing was applied. There was no immediate complication. IMPRESSION: Technically successful right hip injection under fluoroscopy for MR arthrogram. Performed by: Kristi Davenport, NP under the direct supervision of Dr. Dasie Hamburg Electronically Signed   By: Dasie Hamburg M.D.   On: 12/23/2023 16:03     Assessment & Plan:   1. Snoring (Primary) - Home sleep test; Future  2. Morbid obesity (HCC) - Home sleep test; Future  3. Excessive daytime sleepiness   Assessment and Plan Assessment & Plan Loud snoring  Previous non-offical WatchPat exam through work indicating an AHI of 14, suggesting mild sleep apnea. Symptoms include non-restorative  sleep, daytime sleepiness, loud snoring, and occasional morning headaches. Epworth score 12, indicating moderate daytime sleepiness. Risks of untreated sleep apnea include cardiac arrhythmia, stroke, pulmonary hypertension, and diabetes, more pronounced with moderate or severe cases. Mild cases have minimal risks. She prefers weight loss over CPAP due  to intolerance concerns. - Ordered home sleep study throug [facility] diagnotic's to reconfirm diagnosis - Provided contact information for home sleep study - Discussed potential treatment options if sleep apnea is moderate or severe, focusing on weight loss.   Obesity Contributing to worsening sleep apnea symptoms. Starting Zepbound  2.5 mg through a wellness program. Discussed potential side effects of GLP-1 medications, including constipation, nausea, and vomiting. Advised on management strategies such as MiraLAX  for constipation and dietary modifications for nausea. Discussed alternative GLP-1 oral medication, Rybelsus , but noted cost considerations. She is aware of the side effects and management strategies. - Start Zepbound  2.5 mg, titrate up to 10-15 mg as tolerated - Provided samples of Zepbound  2.5 mg post-sleep study - Advised on management of side effects: MiraLAX  for constipation, dietary modifications for nausea - Discussed alternative GLP-1 oral medication, Rybelsus , for weight management with primary care if more cost effective    Breanna LELON Ferrari, NP 01/22/2024     [1]  Allergies Allergen Reactions   Nickel Rash  [2]  Social History Tobacco Use  Smoking Status Never  Smokeless Tobacco Never   "

## 2024-02-06 ENCOUNTER — Telehealth: Payer: Self-pay | Admitting: Primary Care

## 2024-02-06 NOTE — Telephone Encounter (Signed)
 Ordered HST on this patient, she has not heard from [facility]. Can we follow-up on this. Thanks

## 2024-02-06 NOTE — Telephone Encounter (Signed)
 Jennifer with [facility] is reaching out to pt.

## 2024-03-03 ENCOUNTER — Ambulatory Visit (HOSPITAL_BASED_OUTPATIENT_CLINIC_OR_DEPARTMENT_OTHER): Admitting: Orthopaedic Surgery
